# Patient Record
Sex: Female | Born: 1955
Health system: Southern US, Community
[De-identification: ages and names within clinical notes are randomized; demographics above are authoritative.]

## PROBLEM LIST (undated history)

## (undated) DIAGNOSIS — I10 Essential (primary) hypertension: Secondary | ICD-10-CM

## (undated) DIAGNOSIS — E1165 Type 2 diabetes mellitus with hyperglycemia: Principal | ICD-10-CM

## (undated) DIAGNOSIS — E119 Type 2 diabetes mellitus without complications: Secondary | ICD-10-CM

## (undated) DIAGNOSIS — E079 Disorder of thyroid, unspecified: Secondary | ICD-10-CM

## (undated) HISTORY — PX: THYROID SURGERY: SHX805

## (undated) HISTORY — DX: Type 2 diabetes mellitus with hyperglycemia: E11.65

---

## 1997-07-15 ENCOUNTER — Other Ambulatory Visit: Admission: RE | Admit: 1997-07-15 | Discharge: 1997-07-15 | Payer: Self-pay | Admitting: General Surgery

## 2002-11-14 ENCOUNTER — Encounter: Payer: Self-pay | Admitting: Endocrinology

## 2002-11-14 ENCOUNTER — Ambulatory Visit (HOSPITAL_COMMUNITY): Admission: RE | Admit: 2002-11-14 | Discharge: 2002-11-14 | Payer: Self-pay | Admitting: Endocrinology

## 2003-01-17 ENCOUNTER — Ambulatory Visit (HOSPITAL_COMMUNITY): Admission: RE | Admit: 2003-01-17 | Discharge: 2003-01-17 | Payer: Self-pay | Admitting: Family Medicine

## 2003-01-17 ENCOUNTER — Encounter: Payer: Self-pay | Admitting: Family Medicine

## 2003-01-29 ENCOUNTER — Ambulatory Visit (HOSPITAL_COMMUNITY): Admission: RE | Admit: 2003-01-29 | Discharge: 2003-01-29 | Payer: Self-pay | Admitting: Family Medicine

## 2003-01-29 ENCOUNTER — Encounter (INDEPENDENT_AMBULATORY_CARE_PROVIDER_SITE_OTHER): Payer: Self-pay | Admitting: Specialist

## 2003-01-29 ENCOUNTER — Encounter: Payer: Self-pay | Admitting: Family Medicine

## 2003-11-14 ENCOUNTER — Encounter: Admission: RE | Admit: 2003-11-14 | Discharge: 2003-11-14 | Payer: Self-pay | Admitting: Endocrinology

## 2003-12-17 ENCOUNTER — Ambulatory Visit (HOSPITAL_COMMUNITY): Admission: RE | Admit: 2003-12-17 | Discharge: 2003-12-17 | Payer: Self-pay | Admitting: Endocrinology

## 2004-07-16 ENCOUNTER — Other Ambulatory Visit: Admission: RE | Admit: 2004-07-16 | Discharge: 2004-07-16 | Payer: Self-pay | Admitting: *Deleted

## 2004-12-09 ENCOUNTER — Ambulatory Visit (HOSPITAL_COMMUNITY): Admission: RE | Admit: 2004-12-09 | Discharge: 2004-12-09 | Payer: Self-pay | Admitting: Endocrinology

## 2005-04-19 ENCOUNTER — Encounter: Admission: RE | Admit: 2005-04-19 | Discharge: 2005-04-19 | Payer: Self-pay | Admitting: Otolaryngology

## 2006-12-07 ENCOUNTER — Encounter: Admission: RE | Admit: 2006-12-07 | Discharge: 2006-12-07 | Payer: Self-pay | Admitting: Endocrinology

## 2006-12-29 ENCOUNTER — Encounter: Admission: RE | Admit: 2006-12-29 | Discharge: 2006-12-29 | Payer: Self-pay | Admitting: Endocrinology

## 2006-12-29 ENCOUNTER — Other Ambulatory Visit: Admission: RE | Admit: 2006-12-29 | Discharge: 2006-12-29 | Payer: Self-pay | Admitting: Interventional Radiology

## 2006-12-29 ENCOUNTER — Encounter (INDEPENDENT_AMBULATORY_CARE_PROVIDER_SITE_OTHER): Payer: Self-pay | Admitting: Interventional Radiology

## 2007-05-22 ENCOUNTER — Encounter: Admission: RE | Admit: 2007-05-22 | Discharge: 2007-05-22 | Payer: Self-pay | Admitting: Endocrinology

## 2007-12-21 ENCOUNTER — Other Ambulatory Visit: Admission: RE | Admit: 2007-12-21 | Discharge: 2007-12-21 | Payer: Self-pay | Admitting: Family Medicine

## 2008-02-26 ENCOUNTER — Ambulatory Visit (HOSPITAL_COMMUNITY): Admission: RE | Admit: 2008-02-26 | Discharge: 2008-02-27 | Payer: Self-pay | Admitting: General Surgery

## 2008-02-26 ENCOUNTER — Encounter (HOSPITAL_BASED_OUTPATIENT_CLINIC_OR_DEPARTMENT_OTHER): Payer: Self-pay | Admitting: General Surgery

## 2008-04-07 IMAGING — US US BIOPSY
1 series · 5 of 5 positions shown · non-contrast
Comparison: none

CLINICAL DATA: Palpable left thyroid mass with history of cyst on the side with previous benign biopsy performed in 5556.  Patient with symptoms of pressure in her throat.  Request has been made for fine needle aspiration of this nodule.
 ULTRASOUND-GUIDED FINE NEEDLE ASPIRATION, LEFT LOBE OF THYROID:

[Series 1: us biopsy · 5 acquisitions, 5 frames shown]
[im 1/5]
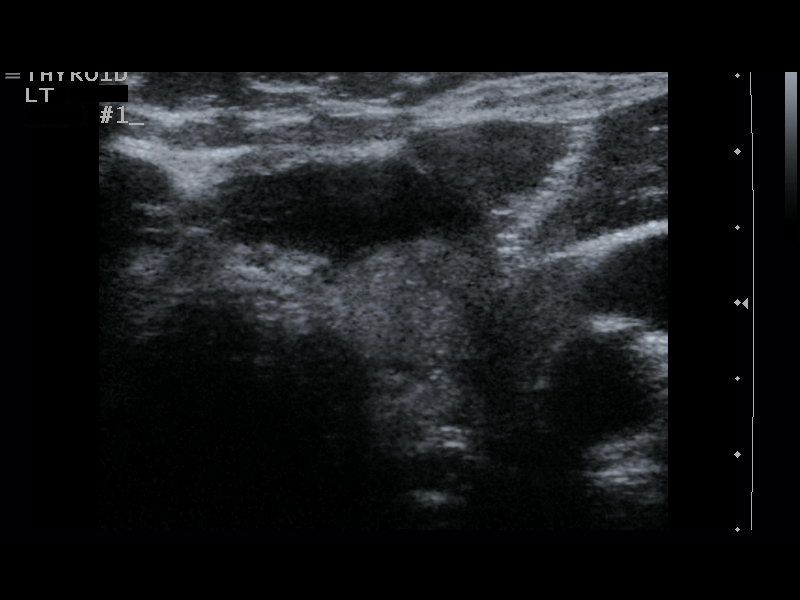
[im 2/5]
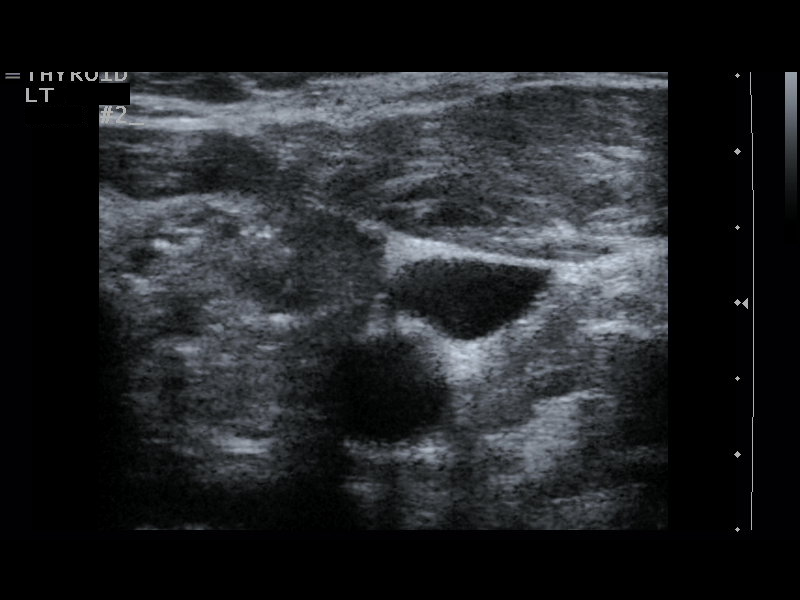
[im 3/5]
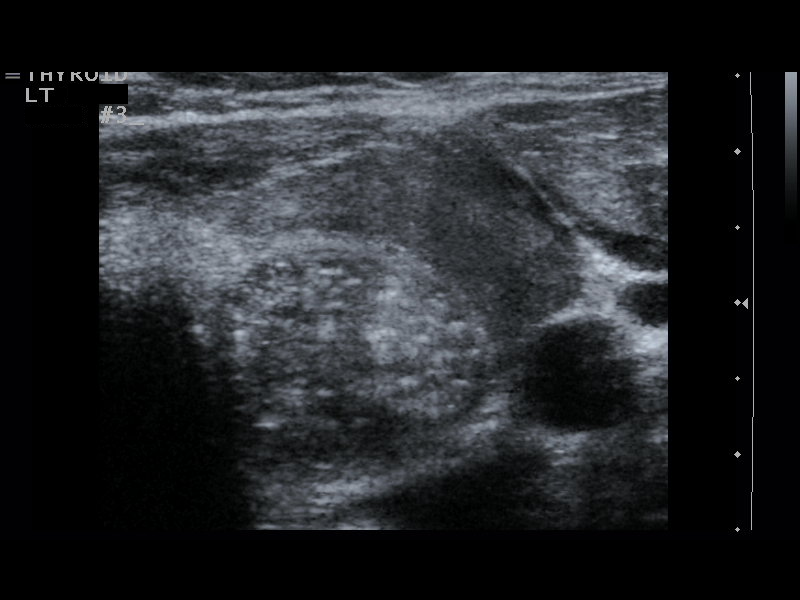
[im 4/5]
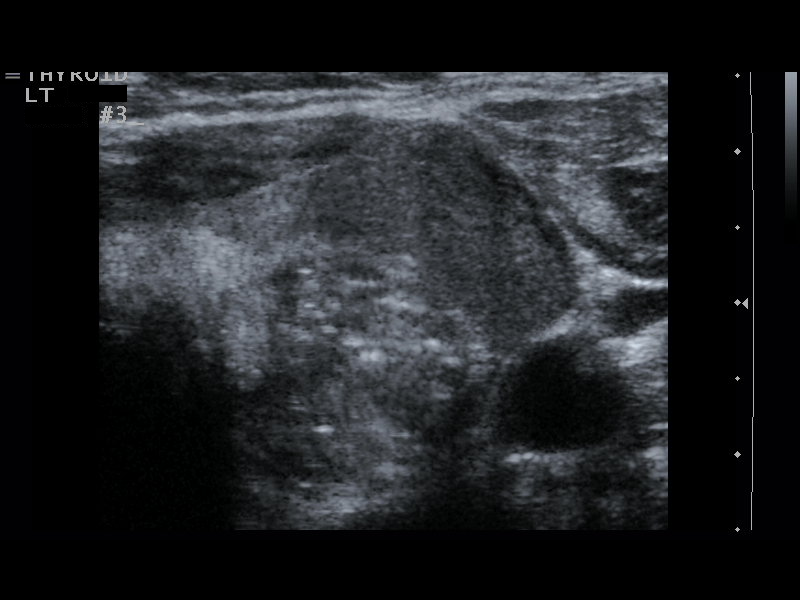
[im 5/5]
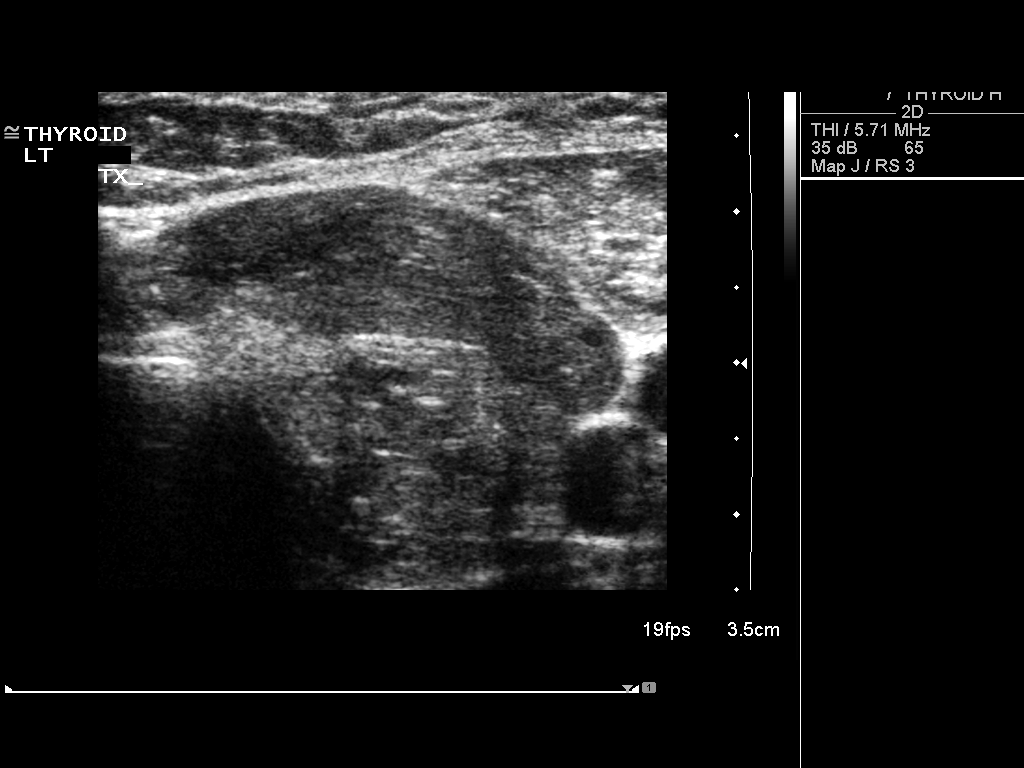

[5 of 5 positions shown; findings below may reference images not displayed]

FINDINGS: The above procedure was thoroughly discussed with the patient and written informed consent was obtained. 
 Ultrasound was then performed to localize and mark an adequate site for the biopsy.  The patient was then prepped and draped in a normal sterile fashion.  1% lidocaine was used for local anesthesia.  Using direct ultrasound guidance, three passes were made using a 25 gauge hypodermic needle into the dominant calcified nodule located within the left lobe of the thyroid.  Ultrasound confirmed placement of the needle on all three occasions.  The specimens were given to Pathology for further analysis.  There was a small amount of bleeding noted around the nodule post procedure.  The patient denied dysphagia or dyspnea other than her normal baseline.  The patient was advised to continue the ice pack, to refrain from aspirin products, and to call us if she experiences any difficulty swallowing or breathing.
IMPRESSION: Successful ultrasound-guided fine needle aspiration, left lobe of the thyroid.  Final pathology pending.

## 2009-03-22 ENCOUNTER — Emergency Department (HOSPITAL_COMMUNITY): Admission: EM | Admit: 2009-03-22 | Discharge: 2009-03-22 | Payer: Self-pay | Admitting: Family Medicine

## 2009-06-11 ENCOUNTER — Encounter: Admission: RE | Admit: 2009-06-11 | Discharge: 2009-08-20 | Payer: Self-pay | Admitting: Family Medicine

## 2010-05-14 ENCOUNTER — Other Ambulatory Visit: Payer: Self-pay | Admitting: Family Medicine

## 2010-05-15 ENCOUNTER — Ambulatory Visit
Admission: RE | Admit: 2010-05-15 | Discharge: 2010-05-15 | Disposition: A | Discharge status: Home / Self Care | Payer: Medicaid Other | Source: Ambulatory Visit | Attending: Family Medicine | Admitting: Family Medicine

## 2010-05-18 ENCOUNTER — Other Ambulatory Visit (HOSPITAL_COMMUNITY): Payer: Self-pay | Admitting: Family Medicine

## 2010-05-18 DIAGNOSIS — R109 Unspecified abdominal pain: Secondary | ICD-10-CM

## 2010-06-01 ENCOUNTER — Ambulatory Visit (HOSPITAL_COMMUNITY)
Admission: RE | Admit: 2010-06-01 | Discharge: 2010-06-01 | Disposition: A | Discharge status: Home / Self Care | Payer: Medicaid Other | Source: Ambulatory Visit | Attending: Family Medicine | Admitting: Family Medicine

## 2010-06-01 DIAGNOSIS — R948 Abnormal results of function studies of other organs and systems: Secondary | ICD-10-CM | POA: Insufficient documentation

## 2010-06-01 DIAGNOSIS — R109 Unspecified abdominal pain: Secondary | ICD-10-CM | POA: Insufficient documentation

## 2010-06-01 MED ORDER — TECHNETIUM TC 99M MEBROFENIN IV KIT
5.0000 | PACK | Freq: Once | INTRAVENOUS | Status: AC | PRN
  Administered 2010-06-01: 5 via INTRAVENOUS

## 2010-08-25 NOTE — Op Note (Signed)
NAME:  Katie Cook, Katie Cook NO.:  [id number]   MEDICAL RECORD NO.:  [id number]          PATIENT TYPE:  AMB   LOCATION:  DAY                          FACILITY:  [facility]   PHYSICIAN:  Leonie Man, M.D.   DATE OF BIRTH:  January 01, 1956   DATE OF PROCEDURE:  02/26/2008  DATE OF DISCHARGE:                               OPERATIVE REPORT   PREOPERATIVE DIAGNOSIS:  Multinodular goiter with compression symptoms.   POSTOPERATIVE DIAGNOSIS:  Multinodular goiter with compression symptoms.   PROCEDURE:  Total thyroidectomy.   SURGEON:  Leonie Man, M.D.   ASSISTANT:  Dr. Consuello Bossier.   ANESTHESIA:  General.   INDICATIONS:  The patient is a 55 year old female presenting with severe  compression symptoms of choking and difficulty breathing from time to  time when she lays flat.  She was scanned and noted to have a  multinodular goiter primarily enlarged on the left side with a large 2  cm nodule within the gland.  Biopsies of both thyroid nodule on the  right and left showed follicular lesions without any atypia consistent  with nonneoplastic goiter.  The patient comes to the operating room now  after risks and potential benefits of surgery been fully discussed, all  questions answered, consent obtained.  The patient is fully aware of the  possibility of recurrent laryngeal nerve injury and a permanent  hypoparathyroidism.   PROCEDURE:  The patient positioned supinely.  Following the induction of  satisfactory general endotracheal anesthesia, the head and neck  hyperextended and the neck is prepped and draped to be included in a  sterile operative field.  Preoperative precautions carried out by  identifying the patient as Katie Cook, procedure to be done as  total thyroidectomy.  Antibiotics given, Cipro 400 mg IV.  No equipment  or airway problems noted prior to beginning of surgery.  The patient did  have her blood sugar checked preoperatively which was noted  to be 178.  She got 4 units of Humulin.   A transverse collar incision is made approximately two fingerbreadths  above the sternal notch, deepened through skin and subcutaneous tissue  and extending from the anterior sternocleidomastoid border on the left  to the right.  Superior myocutaneous flap was raised up to the thyroid  cartilage and inferior myocutaneous flap carried down to the sternal  notch.  The strap muscles are opened in the midline and the dissection  carried over first to the left side exposing the entire left thyroid  lobe, dissection carried superior to the superior pole where the  superior pole vessels were isolated, doubly clipped and transected with  a Harmonic scalpel.  A positive identification of the superior and  inferior parathyroid glands were carried out.  The recurrent laryngeal  nerve was also seen entering the course of dissection.  This was  protected.  Thyroid gland was then dissected free from the surrounding  soft tissue carrying the dissection down along the posterior portion of  the thyroid lobe.  The inferior thyroid vessels were taken between clips  and transected with a Harmonic scalpel.  Dissection was carried down  across the trachea taking  thus a small pyramidal lobe.  The left neck  was then checked for hemostasis.  This was noted to be good.  A sponge  was left in the left neck while attention was turned to the right side  of the neck.  Similarly the midline strap muscles were raised off of the  thyroid gland carrying the dissection laterally and then superiorly up  to the upper pole vessels.  The upper pole vessels were identified,  mobilized, doubly clipped and transected with a harmonic scalpel.  The  upper pole parathyroid gland on the right side was also identified and  protected throughout the course of dissection.  The recurrent laryngeal  nerve and inferior parathyroid glands were also identified and these  were protected throughout  the course of dissection.  Dissection was  carried from superiorly inferiorly with the inferior pole vessels taken  between clips and secured with the Harmonic scalpel.  The entire thyroid  was then dissected free from the trachea.  The left upper pole was  marked with a suture for later identification.  All areas of dissection  within the neck were checked for hemostasis.  Additional bleeding points  treated with electrocautery.  Sponge and instrument counts doubly  verified.  Surgicel pads were placed within the neck on both sides for  additional hemostasis.  The midline strap muscles were closed with  interrupted 3-0 Vicryl sutures.  The platysma muscle closed with  interrupted 3-0 Vicryl sutures.  The skin closed with running 5-0  Monocryl suture reinforced with Steri-Strips and a sterile dressing  applied.  Anesthetic reversed, the patient removed from the operating  room to the recovery room in stable condition.  She tolerated the  procedure well.      Leonie Man, M.D.  Electronically Signed     PB/MEDQ  D:  02/26/2008  T:  02/27/2008  Job:  [id number]   cc:   Dorisann Frames, M.D.  Fax: [phone]

## 2011-01-12 LAB — PROTIME-INR
INR: 1
Prothrombin Time: 12.9

## 2011-01-12 LAB — DIFFERENTIAL
Basophils Absolute: 0.1
Basophils Relative: 1
Eosinophils Absolute: 0.4
Eosinophils Relative: 4
Lymphocytes Relative: 24
Lymphs Abs: 2.4
Monocytes Absolute: 0.7
Monocytes Relative: 7
Neutro Abs: 6.5
Neutrophils Relative %: 65

## 2011-01-12 LAB — GLUCOSE, CAPILLARY
Glucose-Capillary: 151 — ABNORMAL HIGH
Glucose-Capillary: 171 — ABNORMAL HIGH
Glucose-Capillary: 178 — ABNORMAL HIGH
Glucose-Capillary: 187 — ABNORMAL HIGH
Glucose-Capillary: 215 — ABNORMAL HIGH
Glucose-Capillary: 216 — ABNORMAL HIGH
Glucose-Capillary: 233 — ABNORMAL HIGH
Glucose-Capillary: 88

## 2011-01-12 LAB — COMPREHENSIVE METABOLIC PANEL
ALT: 34
AST: 29
Albumin: 3.8
Alkaline Phosphatase: 95
BUN: 6
CO2: 28
Calcium: 9.8
Chloride: 102
Creatinine, Ser: 0.52
GFR calc non Af Amer: >60
Glucose, Bld: 217 — ABNORMAL HIGH
Potassium: 3.9
Sodium: 138
Total Bilirubin: 0.5
Total Protein: 7.4

## 2011-01-12 LAB — CBC
HCT: 37.8
Hemoglobin: 12.4
MCHC: 32.9
MCV: 77.1 — ABNORMAL LOW
Platelets: 283
RBC: 4.91
RDW: 16 — ABNORMAL HIGH
WBC: 10

## 2011-01-12 LAB — CALCIUM
Calcium: 8.6
Calcium: 8.6

## 2011-08-23 IMAGING — US US ABDOMEN COMPLETE
1 series · 14 of 25 positions shown · non-contrast
Comparison: None.

CLINICAL DATA: Abdominal pain, nausea and vomiting for 4 days,
diabetes, hypertension

COMPLETE ABDOMINAL ULTRASOUND

[Series 1: us abdomen complete · 0.35mm/px · 14 of 57 slices shown]
[im 1/57]
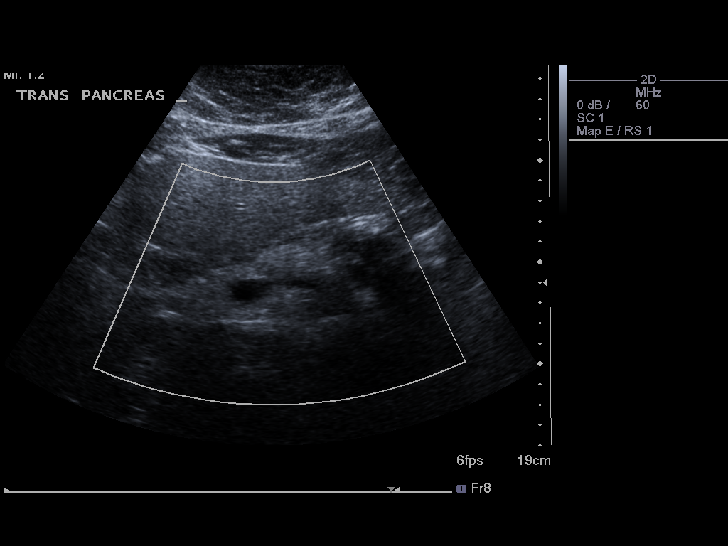
[im 5/57]
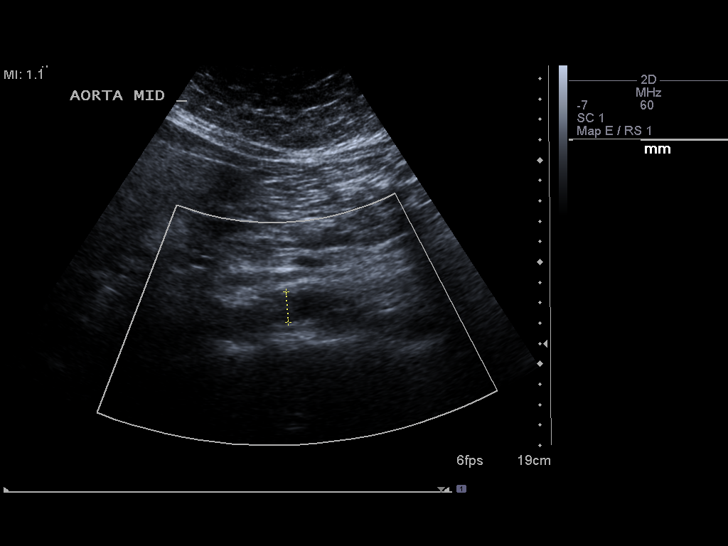
[im 10/57]
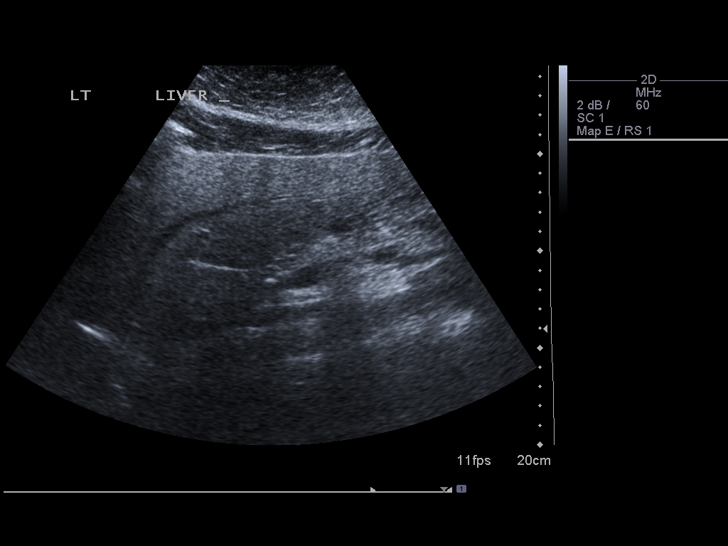
[im 15/57]
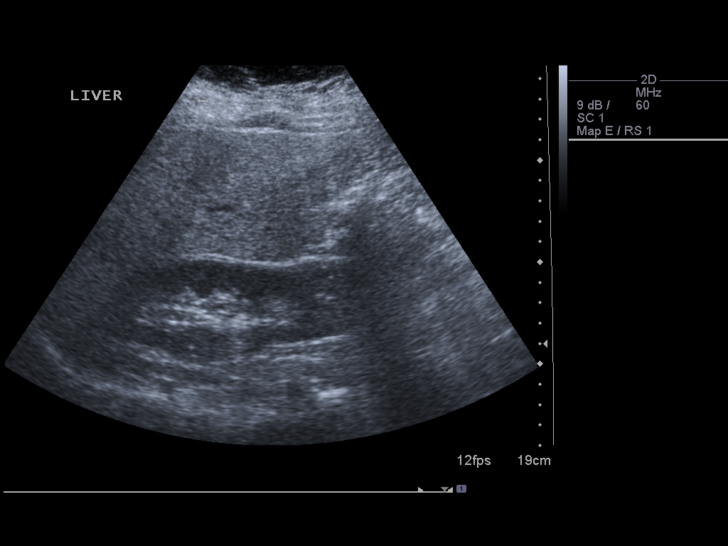
[im 19/57]
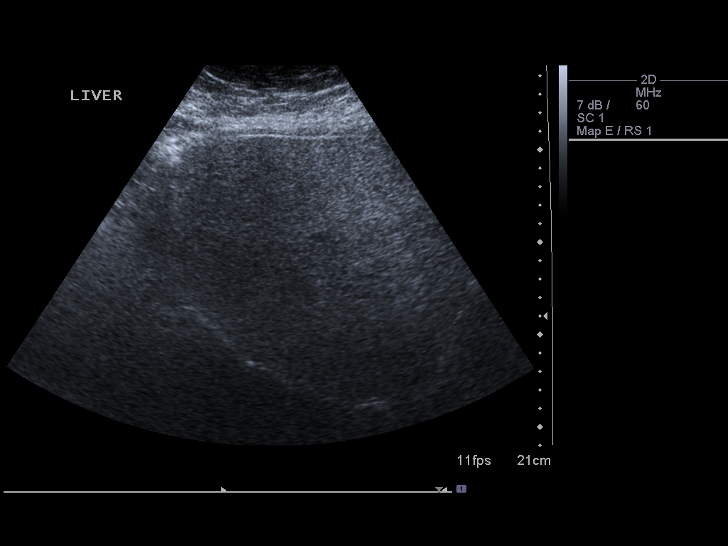
[im 22/57]
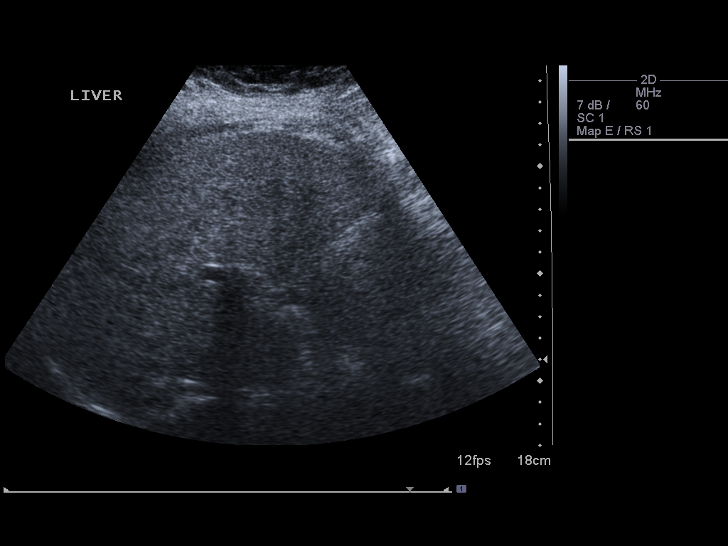
[im 26/57]
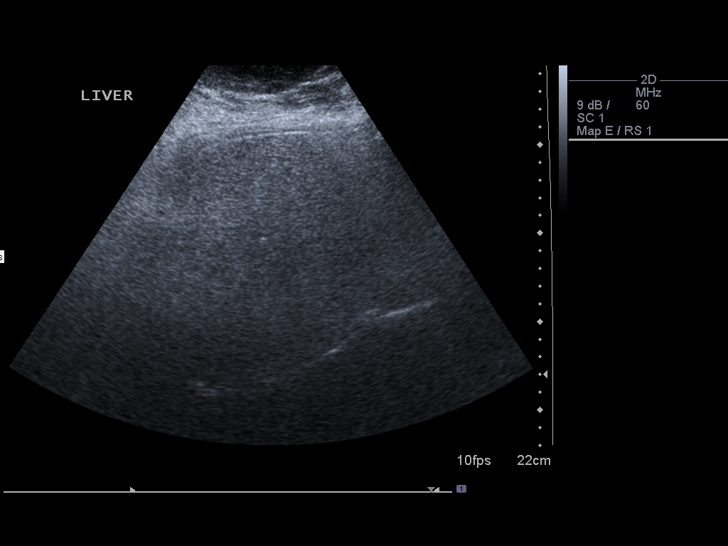
[im 31/57]
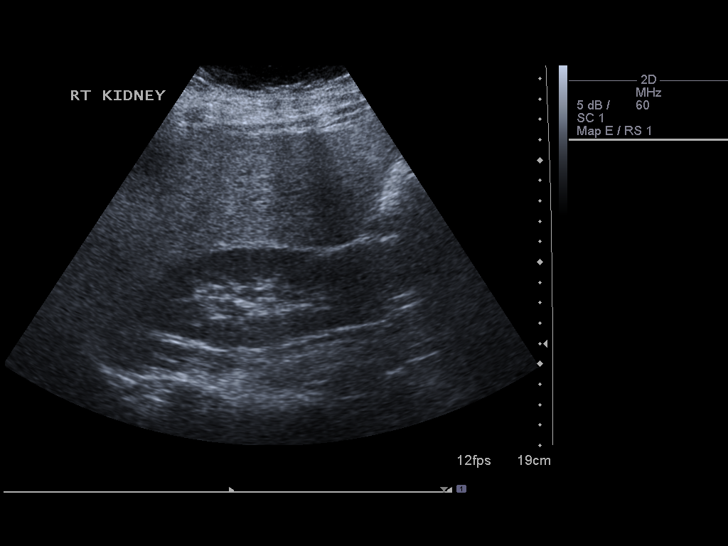
[im 36/57]
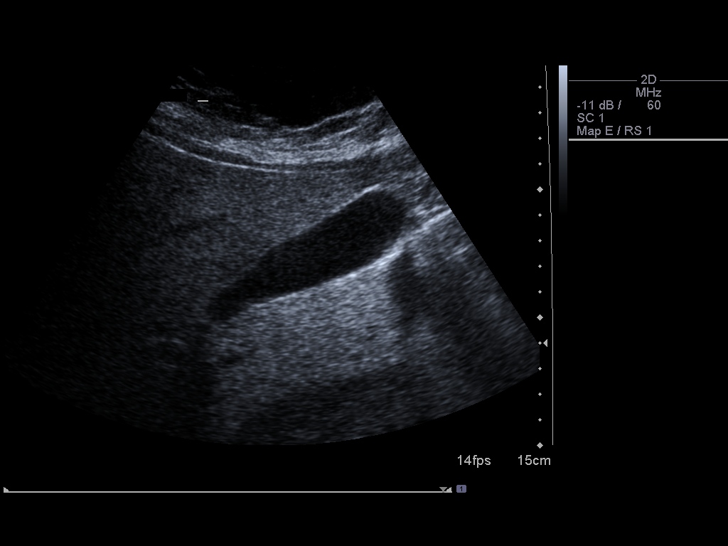
[im 38/57]
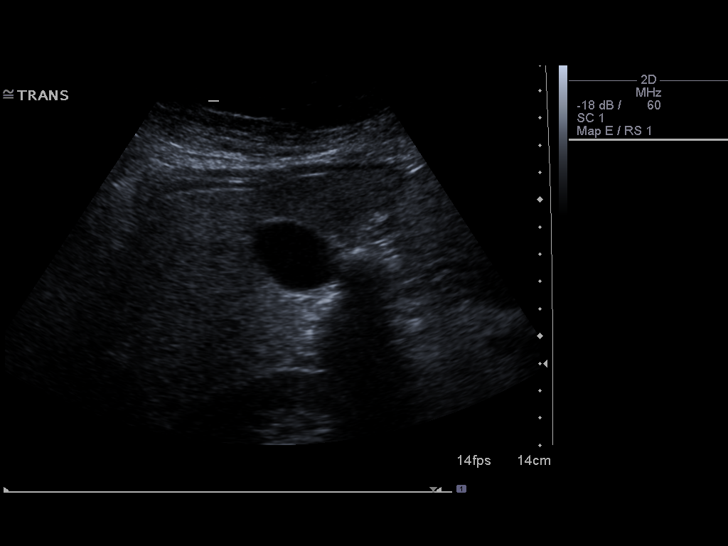
[im 43/57]
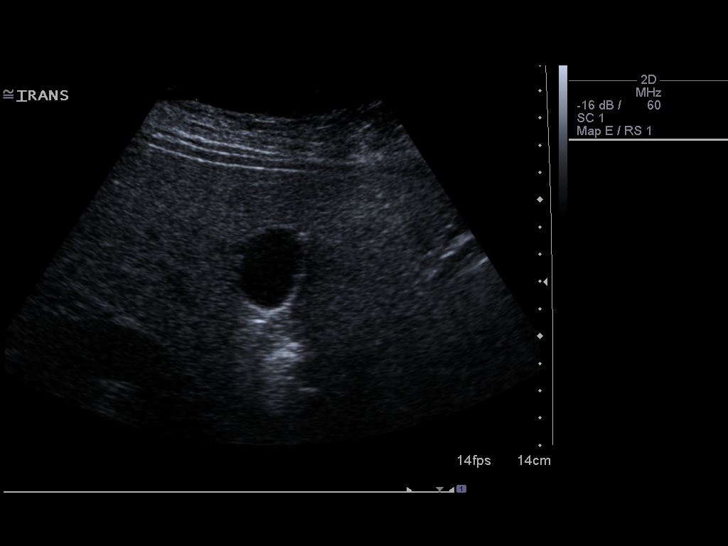
[im 47/57]
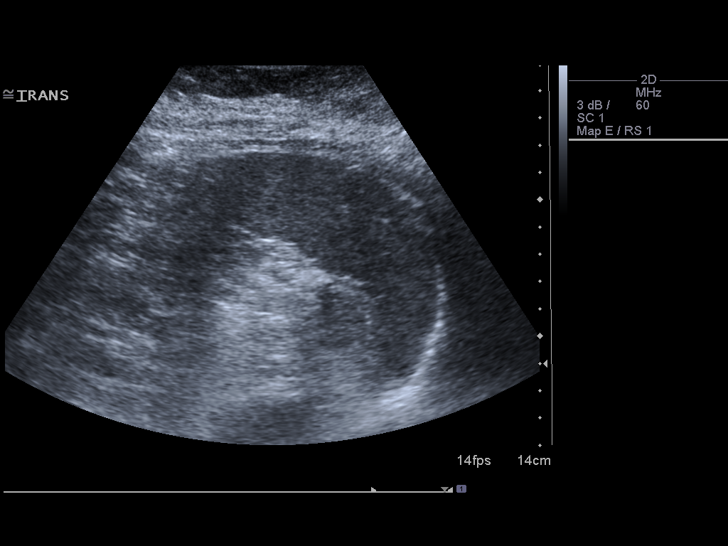
[im 52/57]
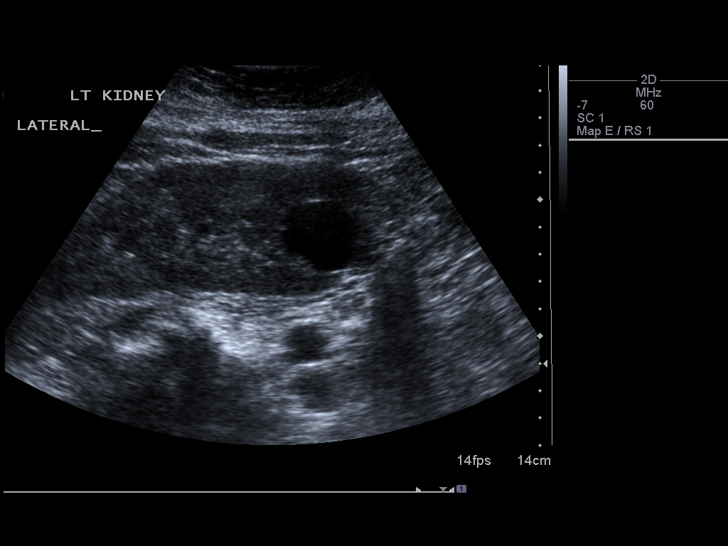
[im 57/57]
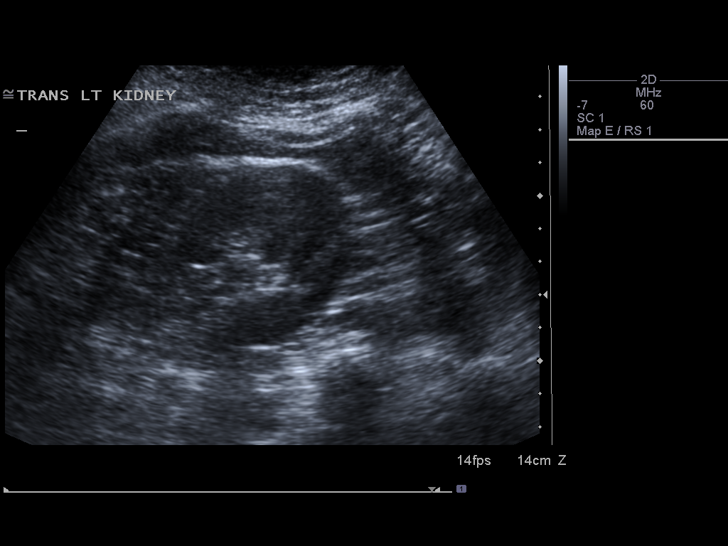

[14 of 25 positions shown; findings below may reference images not displayed]

FINDINGS: Gallbladder:  The gallbladder is visualized and no gallstones are
noted.  There is no pain over gallbladder with compression.

Common bile duct:  The common bile duct is normal measuring 4.5 mm
in diameter.

Liver:  The liver is inhomogeneous and echogenic consistent with
fatty infiltration.  No focal abnormality is seen.

IVC:  Appears normal.

Pancreas:  No focal abnormality seen.

Spleen:  The spleen is normal measuring 7.4 cm sagittally.

Right Kidney:  No hydronephrosis is noted.  The right kidney
measures 12.1 cm sagittally.

Left Kidney:  No hydronephrosis.  The left kidney measures 13.3 cm.
A cyst is present in the lower pole of 3.0 x 2.7 x 2.8 cm.

Abdominal aorta:  The abdominal aorta is normal in caliber although
the bifurcation is obscured by bowel gas.

This study is somewhat compromised by patient body habitus.
IMPRESSION: 1.  Fatty infiltration of the liver.  No ductal dilatation.
2.  No gallstones.
3.  The study is compromised by patient body habitus.

## 2012-02-01 ENCOUNTER — Ambulatory Visit: Payer: Medicaid Other | Attending: Obstetrics and Gynecology | Admitting: Physical Therapy

## 2012-02-01 DIAGNOSIS — IMO0001 Reserved for inherently not codable concepts without codable children: Secondary | ICD-10-CM | POA: Insufficient documentation

## 2012-02-01 DIAGNOSIS — M546 Pain in thoracic spine: Secondary | ICD-10-CM | POA: Insufficient documentation

## 2012-02-08 ENCOUNTER — Ambulatory Visit: Payer: Medicaid Other | Admitting: Physical Therapy

## 2012-02-15 ENCOUNTER — Encounter: Payer: Medicaid Other | Admitting: Physical Therapy

## 2012-02-22 ENCOUNTER — Ambulatory Visit: Payer: Medicaid Other | Attending: Obstetrics and Gynecology | Admitting: Physical Therapy

## 2012-02-22 DIAGNOSIS — M546 Pain in thoracic spine: Secondary | ICD-10-CM | POA: Insufficient documentation

## 2012-02-22 DIAGNOSIS — IMO0001 Reserved for inherently not codable concepts without codable children: Secondary | ICD-10-CM | POA: Insufficient documentation

## 2012-02-29 ENCOUNTER — Ambulatory Visit: Payer: Medicaid Other | Admitting: Physical Therapy

## 2012-06-13 ENCOUNTER — Encounter (HOSPITAL_COMMUNITY): Payer: Self-pay | Admitting: *Deleted

## 2012-06-13 ENCOUNTER — Emergency Department (HOSPITAL_COMMUNITY)
Admission: EM | Admit: 2012-06-13 | Discharge: 2012-06-13 | Disposition: A | Discharge status: Home / Self Care | Payer: Medicaid Other | Attending: Emergency Medicine | Admitting: Emergency Medicine

## 2012-06-13 DIAGNOSIS — Z79899 Other long term (current) drug therapy: Secondary | ICD-10-CM | POA: Insufficient documentation

## 2012-06-13 DIAGNOSIS — H43811 Vitreous degeneration, right eye: Secondary | ICD-10-CM

## 2012-06-13 DIAGNOSIS — E079 Disorder of thyroid, unspecified: Secondary | ICD-10-CM | POA: Insufficient documentation

## 2012-06-13 DIAGNOSIS — H43819 Vitreous degeneration, unspecified eye: Secondary | ICD-10-CM | POA: Insufficient documentation

## 2012-06-13 DIAGNOSIS — H43391 Other vitreous opacities, right eye: Secondary | ICD-10-CM

## 2012-06-13 DIAGNOSIS — Z794 Long term (current) use of insulin: Secondary | ICD-10-CM | POA: Insufficient documentation

## 2012-06-13 DIAGNOSIS — E119 Type 2 diabetes mellitus without complications: Secondary | ICD-10-CM | POA: Insufficient documentation

## 2012-06-13 DIAGNOSIS — I10 Essential (primary) hypertension: Secondary | ICD-10-CM | POA: Insufficient documentation

## 2012-06-13 DIAGNOSIS — K829 Disease of gallbladder, unspecified: Secondary | ICD-10-CM | POA: Insufficient documentation

## 2012-06-13 HISTORY — DX: Essential (primary) hypertension: I10

## 2012-06-13 HISTORY — DX: Type 2 diabetes mellitus without complications: E11.9

## 2012-06-13 HISTORY — DX: Disorder of thyroid, unspecified: E07.9

## 2012-06-13 NOTE — ED Notes (Addendum)
C/o flashing lights, pinpoints to R eye, intermitant, denies pain, OU appear unremarkable & equal, PERRL, 4mm brisk, notices mostly in periphery.

## 2012-06-13 NOTE — ED Notes (Signed)
Visual acquity both eyes 20/30 rt 20/30 lt 20/30

## 2012-06-13 NOTE — ED Provider Notes (Signed)
History     CSN: [id number]  Arrival date & time 06/13/12  0046   None     Chief Complaint  Patient presents with  . Eye Problem    (Consider location/radiation/quality/duration/timing/severity/associated sxs/prior treatment) HPI Salwa Bai is a 57 y.o. female presenting with flashes and floaters of the right eye. Patient has had a history of floaters in the right eye but they have increased today. She's had no change in visual acuity today, she does wear bifocals and sees optometrist Dr. Emily Filbert. On review of systems she's had intermittent headaches, she typically does get headaches and currently does not have a headache. These are intermittent, they typically go away with over-the-counter medications. Patient denies any jaw claudication, headaches are typically frontal.  No history of any recent illness, cough, sore throat, itchy watery eyes, chest pain, shortness of breath, abdominal pain - this evening. Patient occasionally has "gallbladder attacks" and she is spoken with the surgeon concerning this but does not want to have her gallbladder out now because she has bad experiences with anesthesia and analgesia.    Past Medical History  Diagnosis Date  . Hypertension   . Diabetes mellitus without complication   . Thyroid disease     Past Surgical History  Procedure Laterality Date  . Cesarean section    . Thyroid surgery      No family history on file.  History  Substance Use Topics  . Smoking status: Never Smoker   . Smokeless tobacco: Not on file  . Alcohol Use: No    OB History   Grav Para Term Preterm Abortions TAB SAB Ect Mult Living                  Review of Systems Undilated least 10pt or greater review of systems completed and are negative except where specified in the HPI.  Allergies  Ceclor and Sulfa antibiotics  Home Medications  No current outpatient prescriptions on file.  BP 202/97  Pulse 80  Temp(Src) 98.1 F (36.7 C) (Oral)  Resp 17   SpO2 98%  Physical Exam  Nursing notes reviewed.  Electronic medical record reviewed. VITAL SIGNS:   Filed Vitals:   06/13/12 0056 06/13/12 0156  BP: 202/97 149/78  Pulse: 80 68  Temp: 98.1 F (36.7 C)   TempSrc: Oral   Resp: 17 14  SpO2: 98% 98%   CONSTITUTIONAL: Awake, oriented, appears non-toxic HENT: Atraumatic, normocephalic, oral mucosa pink and moist, airway patent. Nares patent without drainage. External ears normal. EYES: Conjunctiva clear, EOMI, PERRLA 4 mm. Disc is crisp in the right eye undilated funduscopic exam. No visual field deficits. NECK: Trachea midline, non-tender, supple CARDIOVASCULAR: Normal heart rate, Normal rhythm, No murmurs, rubs, gallops PULMONARY/CHEST: Clear to auscultation, no rhonchi, wheezes, or rales. Symmetrical breath sounds. Non-tender. ABDOMINAL: Non-distended, obese, soft, non-tender - no rebound or guarding.  BS normal. NEUROLOGIC: Non-focal, moving all four extremities, no gross sensory or motor deficits. EXTREMITIES: No clubbing, cyanosis, or edema SKIN: Warm, Dry, No erythema, No rash  ED Course  Procedures (including critical care time) Bedside ultrasound performed of patient's eye looking for retinal detachment. Scattered hyper echoic signals were seen throughout the vitreous likely representative of floaters, no retinal detachment detected on ultrasound when viewed in orthogonal planes. Optic nerve did not appear enlarged.   Labs Reviewed - No data to display No results found.   1. Vitreous detachment, right   2. Flashers or floaters of right eye  MDM  Danaisha Celli is a 57 y.o. female  Visit history of floaters presents amount of floaters as well as flashers in the right eye tonight. Patient has no loss of visual acuity, her history is not consistent with retinal detachment.  Patient has no pain in the eye either, I do not suspect glaucoma, infection or foreign body. History is not consistent with temporal arteritis.  Undilated funduscopic exam is unremarkable. Ultrasound of the patient's retina with a linear probe is consistent with some floaters, I do not see any retinal detachment. Patient sees optometrist to Dr. Emily Filbert - she can call the office tomorrow followup in 2-3 days. Have explained the diagnosis at length, patient stands to return to the emergency department should she have any decreased vision, curtain coming over her vision or any other signs or symptoms consistent with retinal detachment or should she start having any pain or other concerning symptoms. Patient be discharged home stable and in good condition.        Jones Skene, MD 06/13/12 4782

## 2013-02-22 ENCOUNTER — Other Ambulatory Visit (HOSPITAL_COMMUNITY)
Admission: RE | Admit: 2013-02-22 | Discharge: 2013-02-22 | Disposition: A | Discharge status: Home / Self Care | Payer: Medicaid Other | Source: Ambulatory Visit | Attending: Family Medicine | Admitting: Family Medicine

## 2013-02-22 ENCOUNTER — Other Ambulatory Visit: Payer: Self-pay | Admitting: Family Medicine

## 2013-02-22 DIAGNOSIS — Z01419 Encounter for gynecological examination (general) (routine) without abnormal findings: Secondary | ICD-10-CM | POA: Insufficient documentation

## 2013-07-13 ENCOUNTER — Ambulatory Visit: Payer: Self-pay | Admitting: Internal Medicine

## 2013-07-16 ENCOUNTER — Ambulatory Visit: Payer: Self-pay | Admitting: Internal Medicine

## 2013-07-17 ENCOUNTER — Ambulatory Visit: Payer: Self-pay | Admitting: Internal Medicine

## 2013-07-26 ENCOUNTER — Encounter: Payer: Self-pay | Admitting: Internal Medicine

## 2013-07-26 ENCOUNTER — Ambulatory Visit (INDEPENDENT_AMBULATORY_CARE_PROVIDER_SITE_OTHER): Payer: 59 | Admitting: Internal Medicine

## 2013-07-26 ENCOUNTER — Other Ambulatory Visit: Payer: Self-pay | Admitting: *Deleted

## 2013-07-26 VITALS — BP 122/64 | HR 76 | Temp 98.2°F | Resp 12 | Ht 61.0 in | Wt 230.0 lb

## 2013-07-26 DIAGNOSIS — E890 Postprocedural hypothyroidism: Secondary | ICD-10-CM | POA: Insufficient documentation

## 2013-07-26 DIAGNOSIS — E1165 Type 2 diabetes mellitus with hyperglycemia: Secondary | ICD-10-CM | POA: Insufficient documentation

## 2013-07-26 DIAGNOSIS — IMO0001 Reserved for inherently not codable concepts without codable children: Secondary | ICD-10-CM

## 2013-07-26 DIAGNOSIS — E039 Hypothyroidism, unspecified: Secondary | ICD-10-CM

## 2013-07-26 DIAGNOSIS — Z794 Long term (current) use of insulin: Secondary | ICD-10-CM

## 2013-07-26 HISTORY — DX: Reserved for inherently not codable concepts without codable children: IMO0001

## 2013-07-26 LAB — HEMOGLOBIN A1C: Hgb A1c MFr Bld: 8.1 % — ABNORMAL HIGH (ref 4.6–6.5)

## 2013-07-26 LAB — T4, FREE: Free T4: 1.96 ng/dL — ABNORMAL HIGH (ref 0.60–1.60)

## 2013-07-26 LAB — TSH: TSH: 0.04 u[IU]/mL — ABNORMAL LOW (ref 0.35–5.50)

## 2013-07-26 MED ORDER — GLUCOSE BLOOD VI STRP: ORAL_STRIP | Status: DC

## 2013-07-26 MED ORDER — METFORMIN HCL ER 500 MG PO TB24: 1000.0000 mg | ORAL_TABLET | Freq: Two times a day (BID) | ORAL | Status: DC

## 2013-07-26 MED ORDER — LEVOTHYROXINE SODIUM 175 MCG PO TABS: 175.0000 ug | ORAL_TABLET | Freq: Every day | ORAL | Status: DC

## 2013-07-26 NOTE — Patient Instructions (Signed)
- Please decrease Lantus to 45 units qhs  - continue Novolog, but change the dose as follows:  20 units with a smaller meal  25 units with a regular meal  30 units with a large meal - Please start Metformin XR 500 mg with dinner x 4 days. If you tolerate this well, add another Metformin tablet (500 mg) with breakfast x 4 days. If you tolerate this well, add another metformin tablet with dinner (total 1000 mg) x 4 days. If you tolerate this well, add another metformin tablets with breakfast (total 1000 mg). Continue with 1000 mg of metformin twice a day with breakfast and dinner.  Please return in 1 month with your sugar log.   PATIENT INSTRUCTIONS FOR TYPE 2 DIABETES:  **Please join MyChart!** - see attached instructions about how to join   DIET AND EXERCISE Diet and exercise is an important part of diabetic treatment.  We recommended aerobic exercise in the form of brisk walking (working between 40-60% of maximal aerobic capacity, similar to brisk walking) for 150 minutes per week (such as 30 minutes five days per week) along with 3 times per week performing 'resistance' training (using various gauge rubber tubes with handles) 5-10 exercises involving the major muscle groups (upper body, lower body and core) performing 10-15 repetitions (or near fatigue) each exercise. Start at half the above goal but build slowly to reach the above goals. If limited by weight, joint pain, or disability, we recommend daily walking in a swimming pool with water up to waist to reduce pressure from joints while allow for adequate exercise.    BLOOD GLUCOSES Monitoring your blood glucoses is important for continued management of your diabetes. Please check your blood glucoses 2-4 times a day: fasting, before meals and at bedtime (you can rotate these measurements - e.g. one day check before the 3 meals, the next day check before 2 of the meals and before bedtime, etc.   HYPOGLYCEMIA (low blood sugar) Hypoglycemia  is usually a reaction to not eating, exercising, or taking too much insulin/ other diabetes drugs.  Symptoms include tremors, sweating, hunger, confusion, headache, etc. Treat IMMEDIATELY with 15 grams of Carbs:   4 glucose tablets    cup regular juice/soda   2 tablespoons raisins   4 teaspoons sugar   1 tablespoon honey Recheck blood glucose in 15 mins and repeat above if still symptomatic/blood glucose <100. Please contact our office at [phone] if you have questions about how to next handle your insulin.  RECOMMENDATIONS TO REDUCE YOUR RISK OF DIABETIC COMPLICATIONS: * Take your prescribed MEDICATION(S). * Follow a DIABETIC diet: Complex carbs, fiber rich foods, heart healthy fish twice weekly, (monounsaturated and polyunsaturated) fats * AVOID saturated/trans fats, high fat foods, >2,300 mg salt per day. * EXERCISE at least 5 times a week for 30 minutes or preferably daily.  * DO NOT SMOKE OR DRINK more than 1 drink a day. * Check your FEET every day. Do not wear tightfitting shoes. Contact us if you develop an ulcer * See your EYE doctor once a year or more if needed * Get a FLU shot once a year * Get a PNEUMONIA vaccine once before and once after age 58 years  GOALS:  * Your Hemoglobin A1c of <7%  * fasting sugars need to be <130 * after meals sugars need to be <180 (2h after you start eating) * Your Systolic BP should be 140 or lower  * Your Diastolic BP should be 80 or  lower  * Your HDL (Good Cholesterol) should be 40 or higher  * Your LDL (Bad Cholesterol) should be 100 or lower  * Your Triglycerides should be 150 or lower  * Your Urine microalbumin (kidney function) should be <30 * Your Body Mass Index should be 25 or lower   We will be glad to help you achieve these goals. Our telephone number is: [phone].

## 2013-07-26 NOTE — Progress Notes (Signed)
Patient ID: Katie Cook, female   DOB: 1955/10/22, 58 y.o.   MRN: [id number]  HPI: Katie Cook is a 58 y.o.-year-old female, referred by her PCP, Dr. Tenny Craw, for management of DM2, insulin-dependent, uncontrolled, without complications and postsurgical hypothyroidism (after she had compression sx with MNG - noncancerous).   Hypothyroidism: - she had MNG >> developed compression sxs - thyroidectomy in 2011-2012 - hypothyroidism >> Levothyroxine, then Armour 6 x later: 180 mg 4/7 days and 120 in 3/7 days >> b/c insurance coverage >> switched to Levothyroxine 200 mcg daily. She feels more tired on it.  She takes the LT4: - in am - fasting - eats b'fast 20 min later: yoghurt - she takes it along with Hyzaar - she takes Omeprazole prn later in the day - takes MVI in pm or evening  Last TFTs (will obtain lab results from PCP): 04/2013: TSH 0.4 (at the [facility])  She feels more tired. Also, see HPI.  DM2: Patient has been diagnosed with GDM during pregnancy, then DM2 in 2005; she started insulin in 2008.  Last hemoglobin A1c was: ~8% 04/2013.  Pt is on a regimen of: - Lantus 55 units qhs - Novolog 25-30 units 2x a day. She stopped Victoza >> nausea, 3 years. She tried Actos. She tried Metformin >> nausea. She tried Prandin. She tried Januvia.  She describes a period in the past when she was dropping her sugars at nigt to 50s >> she relaxed her DM control afterwards >> sugars increased.  She lost 15 lbs in last year, now plateaued.  Pt checks her sugars 2x a day and they are: - am: 105-112 - 2h after b'fast: n/c - before lunch: 99-120 - 2h after lunch: n/c - before dinner: n/c - 2h after dinner: n/c - bedtime: 160-230 - nighttime: n/c Has lows. Lowest sugar was 80s; but 50s at night she has hypoglycemia awareness at 70.  Highest sugar was 200s.  Meter: AccuChek Aviva.  Pt's meals are: - Breakfast: yoghurt + fruit - Lunch: salad with [place] - Dinner: chicken,  broccoli, meat - Snacks: 2-3: fruit, popcorn  - no CKD, last BUN/creatinine:  Lab Results  Component Value Date   BUN 6 02/22/2008   CREATININE 0.52 02/22/2008  On Losartan. - Has HL. No lipid panel available. - last eye exam was in 01/2013. No DR.  - no numbness and tingling in her feet.  Pt has FH of DM in sister, grandparents.  ROS: Constitutional: + weight gain/loss, + fatigue, + hot flushes, + poor sleep, + nocturia Eyes: + blurry vision, no xerophthalmia ENT: + sore throat, no nodules palpated in throat, no dysphagia/odynophagia, no hoarseness, + tinnitus, + hypoacusis Cardiovascular: no CP/+ SOB/no palpitations/+ leg swelling Respiratory: no cough/+ SOB Gastrointestinal: no N/V/D/C, + heartburn Musculoskeletal: no muscle aches/+ joint aches Skin: no rashes, + hair loss Neurological: no tremors/numbness/tingling/dizziness, no HA Psychiatric: no depression/anxiety  Past Medical History  Diagnosis Date  . Hypertension   . Diabetes mellitus without complication   . Thyroid disease    Past Surgical History  Procedure Laterality Date  . Cesarean section    . Thyroid surgery     History   Social History  . Marital Status: Legally Separated    Spouse Name: N/A    Number of Children: 3   Occupational History  . 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 Smoking status: Never Smoker   . Smokeless tobacco: Not on file  .  Alcohol Use: No  . Drug Use: No   Current Outpatient Prescriptions on File Prior to Visit  Medication Sig Dispense Refill  . carvedilol (COREG) 12.5 MG tablet Take 12.5 mg by mouth daily.      . cetirizine (ZYRTEC) 10 MG tablet Take 10 mg by mouth daily.      . insulin aspart (NOVOLOG) 100 UNIT/ML injection Inject 25 Units into the skin 2 (two) times daily.      . insulin glargine (LANTUS) 100 UNIT/ML injection Inject 53 Units into the skin at bedtime.       Marland Kitchen. losartan-hydrochlorothiazide (HYZAAR) 100-25  MG per tablet Take 1 tablet by mouth daily.      . beclomethasone (QVAR) 40 MCG/ACT inhaler Inhale 2 puffs into the lungs 2 (two) times daily.      Marland Kitchen. omeprazole (PRILOSEC) 20 MG capsule Take 20 mg by mouth 2 (two) times daily.       No current facility-administered medications on file prior to visit.   Allergies  Allergen Reactions  . Ceclor [Cefaclor] Rash  . Sulfa Antibiotics Rash   Family History  Problem Relation Age of Onset  . Ovarian cancer Mother   . Cancer Mother   . Cancer Father     pancreatic   . Stroke Father   . Diabetes Sister   . Cancer Maternal Aunt     breast cancer   . Stroke Paternal Uncle   . Diabetes Maternal Grandmother   . Cancer Maternal Aunt     thyroid   . Hypertension Sister    PE: BP 122/64  Pulse 76  Temp(Src) 98.2 F (36.8 C) (Oral)  Resp 12  Ht 5\' 1"  (1.549 m)  Wt 230 lb (104.327 kg)  BMI 43.48 kg/m2  SpO2 96% Wt Readings from Last 3 Encounters:  07/26/13 230 lb (104.327 kg)   Constitutional: overweight, in NAD Eyes: PERRLA, EOMI, no exophthalmos ENT: moist mucous membranes, no thyromegaly, no cervical lymphadenopathy Cardiovascular: RRR, No MRG Respiratory: CTA B Gastrointestinal: abdomen soft, NT, ND, BS+ Musculoskeletal: no deformities, strength intact in all 4 Skin: moist, warm, no rashes Neurological: no tremor with outstretched hands, DTR normal in all 4  ASSESSMENT: 1. DM2, insulin-dependent, uncontrolled, without complications  2. Hypothyroidism - postsurgical - h/o benign MNG - thyroidectomy 2011  PLAN:  1. Patient with long-standing, recently more uncontrolled diabetes, on basal-bolus, which became insufficient - It is difficult to change her regimen as I do not have a sugar log, but for now I suggested to:  Patient Instructions  - Please decrease Lantus to 45 units qhs  - continue Novolog, but change the dose as follows:  20 units with a smaller meal  25 units with a regular meal  30 units with a large  meal - Please start Metformin XR 500 mg with dinner x 4 days. If you tolerate this well, add another Metformin tablet (500 mg) with breakfast x 4 days. If you tolerate this well, add another metformin tablet with dinner (total 1000 mg) x 4 days. If you tolerate this well, add another metformin tablets with breakfast (total 1000 mg). Continue with 1000 mg of metformin twice a day with breakfast and dinner.  Please return in 1 month with your sugar log.   - Strongly advised her to start checking sugars at different times of the day - check 3 times a day, rotating checks - given sugar log and advised how to fill it and to bring it at next appt  -  given foot care handout and explained the principles  - given instructions for hypoglycemia management "15-15 rule"  - advised for yearly eye exams - Return to clinic in 1 mo with sugar log   2. Hypothyroidism - will check TFTs today - can either continue Levothyroxine or switch back to Armour (but this is expensive for her) - advised pt to take Levothyroxine EVERY DAY, with water, >30 min before b'fast, separated by >4h from anti acid medication, calcium, iron, MVI.   Office Visit on 07/26/2013  Component Date Value Ref Range Status  . TSH 07/26/2013 0.04* 0.35 - 5.50 uIU/mL Final  . Free T4 07/26/2013 1.96* 0.60 - 1.60 ng/dL Final  . Hemoglobin [id number] 07/26/2013 8.1* 4.6 - 6.5 % Final   Glycemic Control Guidelines for People with Diabetes:Non Diabetic:  <6%Goal of Therapy: <7%Additional Action Suggested:  >8%    Pt taking too much LT4 >> will decrease dose to 175 mcg daily. Will call this in. Will recheck labs at next visit.

## 2013-07-27 ENCOUNTER — Encounter: Payer: Self-pay | Admitting: *Deleted

## 2013-08-20 ENCOUNTER — Ambulatory Visit: Payer: 59 | Admitting: Internal Medicine

## 2013-09-10 ENCOUNTER — Encounter: Payer: Self-pay | Admitting: Internal Medicine

## 2013-09-10 ENCOUNTER — Ambulatory Visit (INDEPENDENT_AMBULATORY_CARE_PROVIDER_SITE_OTHER): Payer: 59 | Admitting: Internal Medicine

## 2013-09-10 VITALS — BP 126/78 | HR 72 | Temp 98.0°F | Resp 12 | Wt 232.8 lb

## 2013-09-10 DIAGNOSIS — E1165 Type 2 diabetes mellitus with hyperglycemia: Principal | ICD-10-CM

## 2013-09-10 DIAGNOSIS — E039 Hypothyroidism, unspecified: Secondary | ICD-10-CM

## 2013-09-10 DIAGNOSIS — IMO0001 Reserved for inherently not codable concepts without codable children: Secondary | ICD-10-CM

## 2013-09-10 NOTE — Patient Instructions (Addendum)
-   Continue Lantus 50 units at bedtime - Continue NovoLog: 25 units with breakfast  35 units with dinner  Please return in 1.5 month with your sugar log.   Please stop at the lab.

## 2013-09-10 NOTE — Progress Notes (Signed)
Patient ID: Katie Cook, female   DOB: Aug 14, 1955, 58 y.o.   MRN: [id number]  HPI: Katie FlavorsMaria Cook is a 58 y.o.-year-old female, referred by her PCP, Dr. Tenny Crawoss, for management of DM2, insulin-dependent, uncontrolled, without complications and postsurgical hypothyroidism (after she had compression sx with MNG - noncancerous).   Hypothyroidism: - she had MNG >> developed compression sxs - thyroidectomy in 2011-2012 - hypothyroidism >> Levothyroxine, then Armour 6 x later: 180 mg 4/7 days and 120 in 3/7 days >> b/c insurance coverage >> switched to Levothyroxine 200 mcg daily. She feels more tired on it.  She takes the LT4: - in am - fasting - eats b'fast 20 min later: yoghurt - she takes it along with Hyzaar - she takes Omeprazole prn later in the day - takes MVI in pm or evening  Last TFTs (will obtain lab results from PCP): 04/2013: TSH 0.4 (at the [facility])  Last TSH was: Lab Results  Component Value Date   TSH 0.04* 07/26/2013   FREET4 1.96* 07/26/2013  We decreased the LT4 to 175 mcg daily.  DM2: Patient has been diagnosed with GDM during pregnancy, then DM2 in 2005; she started insulin in 2008.  Last hemoglobin A1c was: ~8% 04/2013.  Pt is on a regimen of: - Lantus 55 >> 45 >> 50 units qhs 20 units with a smaller meal 25 units with a regular meal  30 units with a large meal She stopped Victoza >> nausea, 3 years. She tried Actos. She tried Metformin >> nausea. She tried Prandin. She tried Januvia.  She lost 15 lbs in last year, now plateaued.  Pt checks her sugars 2x a day and they are: - am: 105-112 >> 90-128 (200) - 2h after b'fast: n/c >> 195, 284 - before lunch: 99-120 >> 120-152, 281   - 2h after lunch: n/c >> 92-156 - before dinner: n/c >> 90-150 (190) - 2h after dinner: n/c >> 197-281 - bedtime: 160-230 >> 160-180s (244) - nighttime: n/c >> 180, 194 Has lows. Lowest sugar was 90s; she has hypoglycemia awareness at 70.  Highest sugar was 284.  Meter:  AccuChek Aviva.  Pt's meals are: - Breakfast: yoghurt + fruit - Lunch: salad with [place] - Dinner: chicken, broccoli, meat - Snacks: 2-3: fruit, popcorn  - no CKD, last BUN/creatinine:  Lab Results  Component Value Date   BUN 6 02/22/2008   CREATININE 0.52 02/22/2008  On Losartan. - Has HL. No lipid panel available. - last eye exam was in 01/2013. No DR.  - no numbness and tingling in her feet.  Pt has FH of DM in sister, grandparents.  ROS: Constitutional: + weight gain/loss, + fatigue, + hot flushes, + poor sleep, + nocturia Eyes: + blurry vision, no xerophthalmia ENT: + sore throat, no nodules palpated in throat, no dysphagia/odynophagia, no hoarseness, + tinnitus, + hypoacusis Cardiovascular: no CP/+ SOB/no palpitations/+ leg swelling Respiratory: no cough/+ SOB Gastrointestinal: no N/V/D/C, + heartburn Musculoskeletal: no muscle aches/+ joint aches Skin: no rashes, + hair loss Neurological: no tremors/numbness/tingling/dizziness, no HA  PE: BP 126/78  Pulse 72  Temp(Src) 98 F (36.7 C) (Oral)  Resp 12  Wt 232 lb 12.8 oz (105.597 kg)  SpO2 97% Wt Readings from Last 3 Encounters:  09/10/13 232 lb 12.8 oz (105.597 kg)  07/26/13 230 lb (104.327 kg)   Constitutional: overweight, in NAD Eyes: PERRLA, EOMI, no exophthalmos ENT: moist mucous membranes, no thyromegaly, no cervical lymphadenopathy Cardiovascular: RRR, No MRG Respiratory: CTA B Gastrointestinal: abdomen soft, NT,  ND, BS+ Musculoskeletal: no deformities, strength intact in all 4 Skin: moist, warm, no rashes Neurological: no tremor with outstretched hands, DTR normal in all 4  ASSESSMENT: 1. DM2, insulin-dependent, uncontrolled, without complications  2. Hypothyroidism - postsurgical - h/o benign MNG - thyroidectomy 2011  PLAN:  1. Patient with long-standing, recently more uncontrolled diabetes, on basal-bolus, which became insufficient - It is difficult to change her regimen as I do not have a  sugar log, but for now I suggested to:  Patient Instructions  - Please decrease Lantus to 45 units qhs  - continue Novolog, but change the dose as follows:  20 units with a smaller meal  25 units with a regular meal  30 units with a large meal - Please start Metformin XR 500 mg with dinner x 4 days. If you tolerate this well, add another Metformin tablet (500 mg) with breakfast x 4 days. If you tolerate this well, add another metformin tablet with dinner (total 1000 mg) x 4 days. If you tolerate this well, add another metformin tablets with breakfast (total 1000 mg). Continue with 1000 mg of metformin twice a day with breakfast and dinner.  Please return in 1 month with your sugar log.   - Strongly advised her to start checking sugars at different times of the day - check 3 times a day, rotating checks - advised for yearly eye exams - Return to clinic in 1 mo with sugar log   2. Hypothyroidism - we decreased the Synthroid dose at last visit - to 175 mcg daily - will check TFTs today - can either continue Levothyroxine or switch back to Armour (but this is expensive for her) - advised pt to take Levothyroxine EVERY DAY, with water, >30 min before b'fast, separated by >4h from anti acid medication, calcium, iron, MVI.   Office Visit on 09/10/2013  Component Date Value Ref Range Status  . TSH 09/10/2013 0.05* 0.35 - 4.50 uIU/mL Final  . Free T4 09/10/2013 1.51  0.60 - 1.60 ng/dL Final   Pt still taking too much Levothyroxine >> will decrease dose to 150 mcg daily. Will call this in. Will recheck labs at next visit.

## 2013-09-11 ENCOUNTER — Other Ambulatory Visit: Payer: Self-pay | Admitting: Internal Medicine

## 2013-09-11 ENCOUNTER — Encounter: Payer: Self-pay | Admitting: Internal Medicine

## 2013-09-11 LAB — TSH: TSH: 0.05 u[IU]/mL — ABNORMAL LOW (ref 0.35–4.50)

## 2013-09-11 LAB — T4, FREE: Free T4: 1.51 ng/dL (ref 0.60–1.60)

## 2013-09-11 MED ORDER — LEVOTHYROXINE SODIUM 150 MCG PO TABS: 150.0000 ug | ORAL_TABLET | Freq: Every day | ORAL | Status: DC

## 2013-10-08 ENCOUNTER — Encounter: Payer: Self-pay | Admitting: Internal Medicine

## 2013-10-08 ENCOUNTER — Ambulatory Visit (INDEPENDENT_AMBULATORY_CARE_PROVIDER_SITE_OTHER): Payer: 59 | Admitting: Internal Medicine

## 2013-10-08 VITALS — BP 124/76 | HR 65 | Temp 98.5°F | Resp 12 | Wt 235.0 lb

## 2013-10-08 DIAGNOSIS — IMO0001 Reserved for inherently not codable concepts without codable children: Secondary | ICD-10-CM

## 2013-10-08 DIAGNOSIS — E039 Hypothyroidism, unspecified: Secondary | ICD-10-CM

## 2013-10-08 DIAGNOSIS — E1165 Type 2 diabetes mellitus with hyperglycemia: Principal | ICD-10-CM

## 2013-10-08 MED ORDER — INSULIN ASPART 100 UNIT/ML ~~LOC~~ SOLN: 20.0000 [IU] | Freq: Two times a day (BID) | SUBCUTANEOUS | Status: DC

## 2013-10-08 MED ORDER — INSULIN GLARGINE 100 UNIT/ML ~~LOC~~ SOLN: 45.0000 [IU] | Freq: Every day | SUBCUTANEOUS | Status: DC

## 2013-10-08 NOTE — Progress Notes (Signed)
Patient ID: Katie Cook, female   DOB: [birth year], 58 y.o.   MRN: [id number]  HPI: Katie Cook is a 58 y.o.-year-old female, referred by her PCP, Dr. Tenny Crawoss, for management of DM2, insulin-dependent, uncontrolled, without complications and postsurgical hypothyroidism (after she had compression sx with MNG - noncancerous).   Hypothyroidism: Reviewed hx: - she had MNG >> developed compression sxs - thyroidectomy in 2011-2012 - hypothyroidism >> Levothyroxine, then Armour 6 x later: 180 mg 4/7 days and 120 in 3/7 days >> b/c insurance coverage >> switched to Levothyroxine 200 mcg daily. She feels more tired on it.  She takes the LT4: - in am - fasting - eats b'fast 20 min later: yoghurt - she takes it along with Hyzaar - she takes Omeprazole prn later in the day - takes MVI in pm or evening  Last TSH was still suppressed: Lab Results  Component Value Date   TSH 0.05* 09/10/2013   TSH 0.04* 07/26/2013   FREET4 1.51 09/10/2013   FREET4 1.96* 07/26/2013  04/2013: TSH 0.4 (at the [facility])  At last visit, we decreased the LT4 to 150 mcg daily.  She feels tired and is gaining weight.  DM2: Patient has been diagnosed with GDM during pregnancy, then DM2 in 2005; she started insulin in 2008.  Last hemoglobin A1c was: ~8% 04/2013.  Pt is on a regimen of: - Lantus 55 >> 45 >> 50 units qhs 20 units with a smaller meal 25 units with a regular meal (usually with b'fast and lunch)  30 units with a large meal (usually with dinner) - Metformin XR 1000 mg at dinner - started 09/2013 She stopped Victoza >> nausea, 3 years. She tried Actos. She tried regular Metformin >> nausea. She tried Prandin. She tried Januvia.  She lost 15 lbs in last year, now plateaued.  Pt checks her sugars 2-3x a day and they are: - am: 105-112 >> 90-128 (200) >> 76-145 - 2h after b'fast: n/c >> 195, 284 >> 90, 192, 250 - before lunch: 99-120 >> 120-152, 281 >> 71-199 - 2h after lunch: n/c >> 92-156 >>  81-210 - before dinner: n/c >> 90-150 (190) >> 71-190 - 2h after dinner: n/c >> 197-281 >> 65-251 - bedtime: 160-230 >> 160-180s (244) >> 96-205 - nighttime: n/c >> 180, 194 >> 113, 295 Has lows. Lowest sugar was 90s; she has hypoglycemia awareness at 70.  Highest sugar was 250.  Meter: AccuChek Aviva.  Pt's meals are: - Breakfast: yoghurt + fruit - Lunch: salad with [place] - Dinner: chicken, broccoli, meat - Snacks: 2-3: fruit, popcorn  - no CKD, last BUN/creatinine:  Lab Results  Component Value Date   BUN 6 02/22/2008   CREATININE 0.52 02/22/2008  On Losartan. - Has HL. No lipid panel available. - last eye exam was in 01/2013. No DR.  - no numbness and tingling in her feet.  I reviewed pt's medications, allergies, PMH, social hx, family hx and no changes required, except as mentioned above.  ROS: Constitutional: + weight gain/loss, + fatigue, + hot flushes, + poor sleep, + nocturia Eyes: + blurry vision, no xerophthalmia ENT: + sore throat, no nodules palpated in throat, no dysphagia/odynophagia, no hoarseness, + tinnitus, + hypoacusis Cardiovascular: no CP/SOB/no palpitations/leg swelling Respiratory: no cough/SOB Gastrointestinal: + N/no V/D/C, + heartburn Musculoskeletal: no muscle aches/joint aches Skin: no rashes, + hair loss, + itching Neurological: no tremors/numbness/tingling/dizziness, + HA  PE: BP 124/76  Pulse 65  Temp(Src) 98.5 F (36.9 C) (Oral)  Resp 12  Wt 235 lb (106.595 kg)  SpO2 97% Wt Readings from Last 3 Encounters:  10/08/13 235 lb (106.595 kg)  09/10/13 232 lb 12.8 oz (105.597 kg)  07/26/13 230 lb (104.327 kg)   Constitutional: overweight, in NAD Eyes: PERRLA, EOMI, no exophthalmos ENT: moist mucous membranes, no thyromegaly, no cervical lymphadenopathy Cardiovascular: RRR, No MRG Respiratory: CTA B Gastrointestinal: abdomen soft, NT, ND, BS+ Musculoskeletal: no deformities, strength intact in all 4 Skin: moist, warm, no  rashes Neurological: no tremor with outstretched hands, DTR normal in all 4  ASSESSMENT: 1. DM2, insulin-dependent, uncontrolled, without complications  2. Hypothyroidism - postsurgical - h/o benign MNG - thyroidectomy 2011  PLAN:  1. Patient with long-standing, recently more uncontrolled diabetes, on basal-bolus, still with fluctuating sugars. We discussed to try to improve her meals. She has 2 small meals during the day and a large meal at dinner, which is a common but not good practice for achieving good DM control. I made specific suggestions about her meals. She is very frustrated that she gains weight despite trying to lose.    - I suggested to:  Patient Instructions  - Please decrease Lantus to 45 units qhs (she did not do this yet) - continue Novolog, but change the dose as follows:  20 units with a smaller meal  25 units with a regular meal  30 units with a large meal - Continue Metformin XR 1000 mg with dinner  Please return in 1.5 month with your sugar log.  - continue checking sugars at different times of the day - check 3 times a day, rotating checks - up to date with eye exams - Return to clinic in 1.5 mo with sugar log   2. Hypothyroidism - we decreased the Synthroid dose at last visit - to 150 mcg daily - she is unhappy that she is tired and gaining weight - we can either continue Levothyroxine or switch back to Armour (but this is expensive for her) - I again reviewed the pituitary-thyroid axis physiology with her and why we keep decreasing the doses of LT4 when TSH is low -explained the risk of a persistently low TSH: arrhythmia, increased coagulability, osteoporosis, etc. - will check TFTs today  - time spent with the patient: 40 min hour, of which >50% was spent in obtaining information about her symptoms, reviewing her previous labs, reviewing and discussing her CBG log, counseling her about diet, about her conditions (please see some of the the discussed topics  above), and developing a plan to address them. She had a number of questions which I addressed.  Office Visit on 10/08/2013  Component Date Value Ref Range Status  . TSH 10/08/2013 0.07* 0.35 - 4.50 uIU/mL Final  . Free T4 10/08/2013 1.38  0.60 - 1.60 ng/dL Final   We need to decrease the LT4 dose even further >> 125 mcg daily. Recheck TFTs at next visit.

## 2013-10-08 NOTE — Patient Instructions (Addendum)
-   Decrease Lantus 45 units at bedtime - Continue NovoLog: 20 units with a smaller meal  25 units with a regular meal (usually with b'fast and lunch)  30 units with a large meal (usually with dinner)  - Metformin XR 1000 mg at dinner  Please return in 1 month with your sugar log.   Please stop at the lab

## 2013-10-09 ENCOUNTER — Other Ambulatory Visit: Payer: Self-pay | Admitting: Internal Medicine

## 2013-10-09 LAB — T4, FREE: Free T4: 1.38 ng/dL (ref 0.60–1.60)

## 2013-10-09 LAB — TSH: TSH: 0.07 u[IU]/mL — ABNORMAL LOW (ref 0.35–4.50)

## 2013-10-09 MED ORDER — LEVOTHYROXINE SODIUM 125 MCG PO TABS
125.0000 ug | ORAL_TABLET | Freq: Every day | ORAL | Status: DC
Start: 2013-10-09 — End: 2013-10-09

## 2013-10-18 ENCOUNTER — Ambulatory Visit: Payer: 59 | Admitting: Internal Medicine

## 2013-10-19 ENCOUNTER — Ambulatory Visit: Payer: 59 | Admitting: Internal Medicine

## 2013-11-09 ENCOUNTER — Telehealth: Payer: Self-pay | Admitting: *Deleted

## 2013-11-09 ENCOUNTER — Ambulatory Visit (INDEPENDENT_AMBULATORY_CARE_PROVIDER_SITE_OTHER): Payer: 59 | Admitting: Internal Medicine

## 2013-11-09 ENCOUNTER — Encounter: Payer: Self-pay | Admitting: Internal Medicine

## 2013-11-09 VITALS — BP 140/92 | HR 65 | Temp 98.4°F | Resp 12 | Wt 236.0 lb

## 2013-11-09 DIAGNOSIS — IMO0001 Reserved for inherently not codable concepts without codable children: Secondary | ICD-10-CM

## 2013-11-09 DIAGNOSIS — E1165 Type 2 diabetes mellitus with hyperglycemia: Secondary | ICD-10-CM

## 2013-11-09 DIAGNOSIS — E039 Hypothyroidism, unspecified: Secondary | ICD-10-CM

## 2013-11-09 LAB — TSH: TSH: 0.21 u[IU]/mL — ABNORMAL LOW (ref 0.35–4.50)

## 2013-11-09 LAB — T4, FREE: Free T4: 1.24 ng/dL (ref 0.60–1.60)

## 2013-11-09 LAB — HEMOGLOBIN A1C: Hgb A1c MFr Bld: 7.9 % — ABNORMAL HIGH (ref 4.6–6.5)

## 2013-11-09 NOTE — Telephone Encounter (Signed)
Pt said she only has 3 tablets left of her levothyroxine. Pt asked if we could send in a new rx to her pharmacy, if it needs to change over the weekend? Advised the pt we would. Be advised.

## 2013-11-09 NOTE — Progress Notes (Signed)
Patient ID: Katie Cook, female   DOB: [birth year], 58 y.o.   MRN: [id number]  HPI: Katie FlavorsMaria Cook is a 58 y.o.-year-old female, returning for f/u for DM2, insulin-dependent, uncontrolled, without complications and postsurgical hypothyroidism (after she had compression sx with MNG - noncancerous).   She feels dizzy and lightheaded lately. She had to stop Metformin 3 days ago b/c dizziness and nausea.   Hypothyroidism: Reviewed hx: - she had MNG >> developed compression sxs - thyroidectomy in 2011-2012 - hypothyroidism >> Levothyroxine, then Armour 6 x later: 180 mg 4/7 days and 120 in 3/7 days >> b/c insurance coverage >> switched to Levothyroxine 200 mcg daily. She feels more tired on it.  She takes the LT4: - in am - fasting - eats b'fast 20 min later: yoghurt - she takes it along with Hyzaar - she takes Omeprazole prn later in the day - takes MVI in pm or evening  Last TSH was still suppressed: Lab Results  Component Value Date   TSH 0.07* 10/08/2013   TSH 0.05* 09/10/2013   TSH 0.04* 07/26/2013   FREET4 1.38 10/08/2013   FREET4 1.51 09/10/2013   FREET4 1.96* 07/26/2013  04/2013: TSH 0.4 (at the [facility])  We decreased the LT4 to 150 >> 125 mcg daily.  She feels tired and is gaining weight.  DM2: Patient has been diagnosed with GDM during pregnancy, then DM2 in 2005; she started insulin in 2008.   Last hemoglobin A1c was:  Lab Results  Component Value Date   HGBA1C 8.1* 07/26/2013  ~8% 04/2013.  Pt is on a regimen of: - Lantus 55 >> 45 >> 50 >> 45 units qhs - NovoLog: 20 units with a smaller meal (am: 20-22) 25 units with a regular meal (usually with b'fast and lunch)  30 units with a large meal (usually with dinner) - Metformin XR 500 mg at lunchtime and 1000 mg at dinner - started 09/2013 She stopped Victoza >> nausea, 3 years. She tried Actos. She tried regular Metformin >> nausea. She tried Prandin. She tried Januvia.  She lost 15 lbs in last year, now  plateaued.  Pt checks her sugars 2-3x a day and they are: - am: 105-112 >> 90-128 (200) >> 76-145 >> 112-120 mostly - 2h after b'fast: n/c >> 195, 284 >> 90, 192, 250 >> 120-140 - before lunch: 99-120 >> 120-152, 281 >> 71-199 >> 120-140 - 2h after lunch: n/c >> 92-156 >> 81-210 >> n/c - before dinner: n/c >> 90-150 (190) >> 71-190 >> 90-100 - 2h after dinner: n/c >> 197-281 >> 65-251 >> 220 - bedtime: 160-230 >> 160-180s (244) >> 96-205 >> 189-190 - nighttime: n/c >> 180, 194 >> 113, 295 >> 3 am: 55 x1 Has lows. Lowest sugar was 55 x1, 70 x1; she has hypoglycemia awareness at 70.  Highest sugar was 200.   Meter: AccuChek Aviva.  Pt's meals are: - Breakfast: yoghurt + fruit - Lunch: salad with [place] - Dinner: chicken, broccoli, meat - Snacks: 2-3: fruit, popcorn  - no CKD, last BUN/creatinine:  Lab Results  Component Value Date   BUN 6 02/22/2008   CREATININE 0.52 02/22/2008  On Losartan. - Has HL. No lipid panel available. - last eye exam was in 01/2013. No DR.  - no numbness and tingling in her feet.  I reviewed pt's medications, allergies, PMH, social hx, family hx and no changes required, except as mentioned above.  ROS: Constitutional: no weight gain/loss, + fatigue, + hot flushes, + poor sleep, +  nocturia Eyes: + blurry vision, no xerophthalmia ENT: + sore throat, no nodules palpated in throat, no dysphagia/odynophagia, no hoarseness, + tinnitus, + hypoacusis Cardiovascular: + CP/+ SOB/no palpitations/no leg swelling Respiratory: no cough/SOB Gastrointestinal: + N/no V/D/C/heartburn Musculoskeletal: no muscle aches/joint aches Skin: no rashes, + hair loss, + itching Neurological: no tremors/numbness/tingling/dizziness, + HA  PE: BP 140/92  Pulse 65  Temp(Src) 98.4 F (36.9 C) (Oral)  Resp 12  Wt 236 lb (107.049 kg)  SpO2 97% Wt Readings from Last 3 Encounters:  11/09/13 236 lb (107.049 kg)  10/08/13 235 lb (106.595 kg)  09/10/13 232 lb 12.8 oz (105.597  kg)   Constitutional: overweight, in NAD Eyes: PERRLA, EOMI, no exophthalmos ENT: moist mucous membranes, no thyromegaly, no cervical lymphadenopathy Cardiovascular: RRR, No MRG Respiratory: CTA B Gastrointestinal: abdomen soft, NT, ND, BS+ Musculoskeletal: no deformities, strength intact in all 4 Skin: moist, warm, no rashes Neurological: no tremor with outstretched hands, DTR normal in all 4  ASSESSMENT: 1. DM2, insulin-dependent, uncontrolled, without complications  2. Hypothyroidism - postsurgical - h/o benign MNG - thyroidectomy 2011  PLAN:  1. Patient with long-standing, recently more uncontrolled diabetes, on basal-bolus, still with fluctuating sugars, but improved throughout the day. Sugars still high after dinner. We spent a lot of time discussing nutrition and how to gauge her insulin to her meals - will also refer to nutrition.    - I suggested to:  Patient Instructions  Please stop at the lab. Please continue Lantus 45 units at bedtime Can stay of Metformin for now. Continue NovoLog: 20 units with a smaller meal  25 units with a regular meal   30 units with a large meal >> can increase to 35 units with dinner if you have carbohydrates with dinner We will refer you to nutrition. Please return in 1.5 months with your sugar log.  - continue checking sugars at different times of the day - check 3 times a day, rotating checks - up to date with eye exams - will check HbA1c now - Return to clinic in 1.5 mo with sugar log   2. Hypothyroidism - we decreased the Synthroid dose at last visit - to 125 mcg daily - she still feels tired and unhappy 2/2 weight gain (only gained 1 lb since last visit ) - will check TFTs today  Office Visit on 11/09/2013  Component Date Value Ref Range Status  . TSH 11/09/2013 0.21* 0.35 - 4.50 uIU/mL Final  . Free T4 11/09/2013 1.24  0.60 - 1.60 ng/dL Final  . Hemoglobin Z6X 11/09/2013 7.9* 4.6 - 6.5 % Final   Glycemic Control Guidelines  for People with Diabetes:Non Diabetic:  <6%Goal of Therapy: <7%Additional Action Suggested:  >8%   HbA1c better. TSH also better. Not normal, but will leave her on the same dose of Levothyroxine for now. I will recheck her tests when she comes back.

## 2013-11-09 NOTE — Patient Instructions (Addendum)
Please stop at the lab. Please continue Lantus 45 units at bedtime Can stay of Metformin for now. Continue NovoLog: 20 units with a smaller meal  25 units with a regular meal   30 units with a large meal >> can increase to 35 units with dinner if you have carbohydrates with dinner We will refer you to nutrition. Please return in 1.5 months with your sugar log.

## 2013-12-04 ENCOUNTER — Ambulatory Visit: Payer: 59

## 2013-12-10 ENCOUNTER — Ambulatory Visit: Payer: 59 | Attending: Family Medicine | Admitting: Physical Therapy

## 2013-12-10 DIAGNOSIS — J45909 Unspecified asthma, uncomplicated: Secondary | ICD-10-CM | POA: Insufficient documentation

## 2013-12-10 DIAGNOSIS — M545 Low back pain, unspecified: Secondary | ICD-10-CM | POA: Diagnosis not present

## 2013-12-10 DIAGNOSIS — IMO0001 Reserved for inherently not codable concepts without codable children: Secondary | ICD-10-CM | POA: Diagnosis present

## 2013-12-10 DIAGNOSIS — E119 Type 2 diabetes mellitus without complications: Secondary | ICD-10-CM | POA: Insufficient documentation

## 2013-12-20 ENCOUNTER — Ambulatory Visit: Payer: 59 | Attending: Family Medicine | Admitting: Rehabilitation

## 2013-12-20 DIAGNOSIS — M545 Low back pain, unspecified: Secondary | ICD-10-CM | POA: Diagnosis not present

## 2013-12-20 DIAGNOSIS — E119 Type 2 diabetes mellitus without complications: Secondary | ICD-10-CM | POA: Insufficient documentation

## 2013-12-20 DIAGNOSIS — IMO0001 Reserved for inherently not codable concepts without codable children: Secondary | ICD-10-CM | POA: Diagnosis not present

## 2013-12-20 DIAGNOSIS — J45909 Unspecified asthma, uncomplicated: Secondary | ICD-10-CM | POA: Insufficient documentation

## 2013-12-25 ENCOUNTER — Ambulatory Visit: Payer: 59 | Admitting: Rehabilitation

## 2013-12-25 DIAGNOSIS — IMO0001 Reserved for inherently not codable concepts without codable children: Secondary | ICD-10-CM | POA: Diagnosis not present

## 2013-12-27 ENCOUNTER — Ambulatory Visit: Payer: 59 | Admitting: Internal Medicine

## 2014-01-02 ENCOUNTER — Encounter: Payer: 59 | Admitting: Rehabilitation

## 2014-01-10 ENCOUNTER — Ambulatory Visit: Payer: 59 | Attending: Family Medicine | Admitting: Physical Therapy

## 2014-01-10 DIAGNOSIS — E119 Type 2 diabetes mellitus without complications: Secondary | ICD-10-CM | POA: Insufficient documentation

## 2014-01-10 DIAGNOSIS — M545 Low back pain: Secondary | ICD-10-CM | POA: Insufficient documentation

## 2014-01-10 DIAGNOSIS — J45909 Unspecified asthma, uncomplicated: Secondary | ICD-10-CM | POA: Insufficient documentation

## 2014-01-15 ENCOUNTER — Ambulatory Visit: Payer: 59 | Admitting: Internal Medicine

## 2014-01-23 ENCOUNTER — Ambulatory Visit: Payer: 59 | Admitting: Rehabilitation

## 2014-02-06 ENCOUNTER — Ambulatory Visit: Payer: 59 | Admitting: Physical Therapy

## 2014-02-06 DIAGNOSIS — M545 Low back pain: Secondary | ICD-10-CM | POA: Diagnosis not present

## 2014-02-07 ENCOUNTER — Encounter: Payer: Self-pay | Admitting: Internal Medicine

## 2014-02-07 ENCOUNTER — Ambulatory Visit (INDEPENDENT_AMBULATORY_CARE_PROVIDER_SITE_OTHER): Payer: 59 | Admitting: Internal Medicine

## 2014-02-07 VITALS — BP 114/68 | HR 62 | Temp 98.0°F | Resp 12 | Wt 235.8 lb

## 2014-02-07 DIAGNOSIS — E039 Hypothyroidism, unspecified: Secondary | ICD-10-CM

## 2014-02-07 MED ORDER — CANAGLIFLOZIN 100 MG PO TABS: 100.0000 mg | ORAL_TABLET | Freq: Every day | ORAL | Status: DC

## 2014-02-07 NOTE — Patient Instructions (Addendum)
Please continue: - Lantus 55 units at night - NovoLog:  20 units with a smaller meal (am: 20-22)  25 units with a regular meal (usually with b'fast and lunch)  30 units with a large meal (usually with dinner)  Please start Invokana 100 mg daily in am.   Please return in 1 month with your sugar log.

## 2014-02-07 NOTE — Progress Notes (Signed)
Patient ID: Katie Cook, female   DOB: 1955-09-09, 58 y.o.   MRN: [id number]  HPI: Katie Cook is a 58 y.o.-year-old female, returning for f/u for DM2, insulin-dependent, uncontrolled, without complications and postsurgical hypothyroidism (after she had compression sx with MNG - noncancerous). Last visit 3 mo ago.  Hypothyroidism: Reviewed hx: - she had MNG >> developed compression sxs - thyroidectomy in 2011-2012 - hypothyroidism >> Levothyroxine, then Armour 6 x later: 180 mg 4/7 days and 120 in 3/7 days >> b/c insurance coverage >> switched to Levothyroxine 200 mcg daily. She feels more tired on it.  She takes the LT4: - in am - fasting - eats b'fast >30 min later: yoghurt - she takes Omeprazole prn later in the day - takes MVI in pm or evening  Last TSH was still suppressed: Lab Results  Component Value Date   TSH 0.21* 11/09/2013   TSH 0.07* 10/08/2013   TSH 0.05* 09/10/2013   TSH 0.04* 07/26/2013   FREET4 1.24 11/09/2013   FREET4 1.38 10/08/2013   FREET4 1.51 09/10/2013   FREET4 1.96* 07/26/2013  04/2013: TSH 0.4 (at the [facility])  We decreased the LT4 to 150 >> 125 mcg daily.  She feels tired and is not having success with losing weight despite having worked on her diet since the last visit.  DM2: Patient has been diagnosed with GDM during pregnancy, then DM2 in 2005; she started insulin in 2008.   Last hemoglobin A1c was:  Lab Results  Component Value Date   HGBA1C 7.9* 11/09/2013   HGBA1C 8.1* 07/26/2013  ~8% 04/2013.  Pt is on a regimen of: - Lantus 55 >> 45 >> 50 >> 45 units qhs - NovoLog: 20 units with a smaller meal (am: 20-22) 25 units with a regular meal (usually with b'fast and lunch)  30 units with a large meal (usually with dinner) - Metformin XR 500 mg at lunchtime and 1000 mg at dinner - started 09/2013 She stopped Victoza >> nausea, 3 years. She tried Actos. She tried regular Metformin >> nausea. She tried Prandin. She tried Januvia.  Pt  checks her sugars 2-3x a day and they are fluctuating, but overall higher than before: - am: 105-112 >> 90-128 (200) >> 76-145 >> 112-120 mostly >> 55-110 - 2h after b'fast: n/c >> 195, 284 >> 90, 192, 250 >> 120-140 >> 74, 207 - before lunch: 99-120 >> 120-152, 281 >> 71-199 >> 120-140 >> 77, 169, 269 - 2h after lunch: n/c >> 92-156 >> 81-210 >> n/c >> 201-210 - before dinner: n/c >> 90-150 (190) >> 71-190 >> 90-100 >> 102-130, 260 - 2h after dinner: n/c >> 197-281 >> 65-251 >> 220 >> 122, 177-260, 395 - bedtime: 160-230 >> 160-180s (244) >> 96-205 >> 189-190 >> 130, 200-275 - nighttime: n/c >> 180, 194 >> 113, 295 >> 3 am: 55 x1 >> 67, 262 Has lows. Lowest sugar was 55 x1; she has hypoglycemia awareness at 70.  Highest sugar was 395.   Meter: AccuChek Aviva.  Pt's meals are: - Breakfast: yoghurt + fruit - Lunch: salad with [place] - Dinner: chicken, broccoli, meat - Snacks: 2-3: fruit, popcorn  - no CKD, last BUN/creatinine:  Lab Results  Component Value Date   BUN 6 02/22/2008   CREATININE 0.52 02/22/2008  On Losartan. - Has HL. No lipid panel available. - last eye exam was in 01/2013. No DR.  - no numbness and tingling in her feet.  I reviewed pt's medications, allergies, PMH, social  hx, family hx and no changes required, except as mentioned above.  ROS: Constitutional: no weight gain/loss, + fatigue, + hot flushes, + poor sleep, no nocturia Eyes: no blurry vision, no xerophthalmia ENT: + sore throat, no nodules palpated in throat, no dysphagia/odynophagia, no hoarseness, + tinnitus, + hypoacusis Cardiovascular: no CP/+ SOB/no palpitations/no leg swelling Respiratory: no cough/SOB Gastrointestinal: + N/no V/D/C/+ heartburn Musculoskeletal: no muscle aches/joint aches Skin: no rashes, no hair loss Neurological: no tremors/numbness/tingling/dizziness, + HA  PE: BP 114/68  Pulse 62  Temp(Src) 98 F (36.7 C) (Oral)  Resp 12  Wt 235 lb 12.8 oz (106.958 kg)  SpO2  97% Wt Readings from Last 3 Encounters:  02/07/14 235 lb 12.8 oz (106.958 kg)  11/09/13 236 lb (107.049 kg)  10/08/13 235 lb (106.595 kg)   Constitutional: overweight, in NAD Eyes: PERRLA, EOMI, no exophthalmos ENT: moist mucous membranes, no thyromegaly, no cervical lymphadenopathy Cardiovascular: RRR, No MRG Respiratory: CTA B Gastrointestinal: abdomen soft, NT, ND, BS+ Musculoskeletal: no deformities, strength intact in all 4 Skin: moist, warm, no rashes Neurological: no tremor with outstretched hands, DTR normal in all 4  ASSESSMENT: 1. DM2, insulin-dependent, uncontrolled, without complications  2. Hypothyroidism - postsurgical - thyroidectomy 2011 - h/o benign MNG  PLAN:  1. Patient with long-standing, recently more uncontrolled diabetes, on basal-bolus insulin regimen, still with fluctuating sugars, worsened.  At last visit, we spent a lot of time discussing nutrition and how to gauge her insulin to her meals and also referred her to nutrition (pending appt) >> she tried to change her diet and worked hard on cutting down portions and carbs w/o success in wt loss and with higher sugars >> she is again, very frustrated with this... - To work on both her DM and weight, I suggested to add Invokana: Patient Instructions  Please continue: - Lantus 55 units at night - NovoLog:  20 units with a smaller meal (am: 20-22)  25 units with a regular meal (usually with b'fast and lunch)  30 units with a large meal (usually with dinner)  Please start Invokana 100 mg daily in am.   Please return in 1 month with your sugar log.  - we discussed about SEs of Invokana, which are: dizziness (advised to be careful when stands from sitting position), decreased BP - usually not < normal (BP today is not low), and fungal UTIs (advised to let me know if develops one).  - given discount card for Invokana - continue checking sugars at different times of the day - check 3 times a day, rotating  checks - up to date with eye exams - will check HbA1c in 2 days - will need to obtain records from Dr Tenny Crawoss Re: her latest Lipid panel and CMP - Return to clinic in 1 mo with sugar log >> needs a potassium check then  2. Hypothyroidism - on Synthroid 125 mcg daily - last TSH a little low at last check but we left the dose the same - will check TFTs in 2 days - she still feels tired and unhappy 2/2 weight gain, but explained this is not 2/2 thyroid ds  Component     Latest Ref Rng 02/11/2014  TSH     0.35 - 4.50 uIU/mL 8.94 (H)  Free T4     0.60 - 1.60 ng/dL 5.281.02  Hgb U1LA1c MFr Bld     4.6 - 6.5 % 8.1 (H)   HbA1c higher >> we started Invokana. TSH high, while at last  check on the same dose it was low... Will ask her if she missed any doses of LT4. Will continue same dose for now and recheck thyroid labs in 1 mo.

## 2014-02-11 ENCOUNTER — Other Ambulatory Visit (INDEPENDENT_AMBULATORY_CARE_PROVIDER_SITE_OTHER): Payer: 59

## 2014-02-11 DIAGNOSIS — E039 Hypothyroidism, unspecified: Secondary | ICD-10-CM

## 2014-02-11 LAB — TSH: TSH: 8.94 u[IU]/mL — ABNORMAL HIGH (ref 0.35–4.50)

## 2014-02-11 LAB — T4, FREE: Free T4: 1.02 ng/dL (ref 0.60–1.60)

## 2014-02-11 LAB — HEMOGLOBIN A1C: Hgb A1c MFr Bld: 8.1 % — ABNORMAL HIGH (ref 4.6–6.5)

## 2014-02-22 ENCOUNTER — Telehealth: Payer: Self-pay | Admitting: *Deleted

## 2014-02-22 NOTE — Telephone Encounter (Signed)
Pt called and lvm stating that she is taking her medication every day, what should she do. Returned pt's call and lvm advising her per Dr Charlean SanfilippoGherghe's note to stay on the same dose and she will recheck her levels in 4 weeks.

## 2014-03-15 ENCOUNTER — Ambulatory Visit: Payer: 59 | Admitting: Internal Medicine

## 2014-03-22 ENCOUNTER — Encounter: Payer: Self-pay | Admitting: Internal Medicine

## 2014-03-22 ENCOUNTER — Other Ambulatory Visit: Payer: Self-pay | Admitting: Internal Medicine

## 2014-03-22 ENCOUNTER — Ambulatory Visit (INDEPENDENT_AMBULATORY_CARE_PROVIDER_SITE_OTHER): Payer: 59 | Admitting: Internal Medicine

## 2014-03-22 VITALS — BP 128/82 | HR 87 | Wt 232.0 lb

## 2014-03-22 DIAGNOSIS — IMO0001 Reserved for inherently not codable concepts without codable children: Secondary | ICD-10-CM

## 2014-03-22 DIAGNOSIS — E1165 Type 2 diabetes mellitus with hyperglycemia: Secondary | ICD-10-CM

## 2014-03-22 DIAGNOSIS — E039 Hypothyroidism, unspecified: Secondary | ICD-10-CM

## 2014-03-22 LAB — T4, FREE: Free T4: 1.19 ng/dL (ref 0.80–1.80)

## 2014-03-22 LAB — TSH: TSH: 18.03 u[IU]/mL — ABNORMAL HIGH (ref 0.350–4.500)

## 2014-03-22 NOTE — Patient Instructions (Signed)
Please stop at [facility] lab downstairs.  I will let you know about the dose of Levothyroxine after the results are back.  Please continue: - Lantus 35 units at night Please increase: - NovoLog:  20 >> 22 units with a smaller meal  25 >> 27 units with a regular meal (usually with b'fast and lunch)  30 >> 33 units with a large meal (usually with dinner)  Please re-start Invokana 100 mg daily in am after the diarrhea has resolved.  Please come back for a follow-up appointment in 3 months

## 2014-03-22 NOTE — Progress Notes (Signed)
Patient ID: Katie Cook, female   DOB: 10-16-1955, 58 y.o.   MRN: [id number]  HPI: Katie SicilianMaria E Cook is a 58 y.o.-year-old female, returning for f/u for DM2, insulin-dependent, uncontrolled, without complications and postsurgical hypothyroidism (after she had compression sx with MNG - noncancerous). Last visit 1.5 mo ago.  She had diarrhea last week >> held Invokana 5 days ago (has been taking it for 1 mo). She still has nausea/diarrhea. She is on Zofran.  Hypothyroidism: Reviewed hx: - she had MNG >> developed compression sxs - thyroidectomy in 2011-2012 - hypothyroidism >> Levothyroxine, then Armour 6 x later: 180 mg 4/7 days and 120 in 3/7 days >> b/c insurance coverage >> switched to Levothyroxine 200 mcg daily. She feels more tired on it.  She takes the LT4: - in am - fasting - eats b'fast >30 min later: yoghurt - she takes Omeprazole prn later in the day - takes MVI in pm or evening  Last TSH was still suppressed: Lab Results  Component Value Date   TSH 8.94* 02/11/2014   TSH 0.21* 11/09/2013   TSH 0.07* 10/08/2013   TSH 0.05* 09/10/2013   TSH 0.04* 07/26/2013   FREET4 1.02 02/11/2014   FREET4 1.24 11/09/2013   FREET4 1.38 10/08/2013   FREET4 1.51 09/10/2013   FREET4 1.96* 07/26/2013  04/2013: TSH 0.4 (at the [facility])  She was on LT4 125 mcg daily, we did not change the dose after last TSH level. Hiowever, she tells me that she actually got a refill of the 100 mcg LT4 instead of the 125 mcg >> unclear how she got this...  She feels tired, but better than last visit and started to have some success with losing weight, but this may be 2/2 diarrhea, she believes.  DM2: Patient has been diagnosed with GDM during pregnancy, then DM2 in 2005; she started insulin in 2008.   Last hemoglobin A1c was:  Lab Results  Component Value Date   HGBA1C 8.1* 02/11/2014   HGBA1C 7.9* 11/09/2013   HGBA1C 8.1* 07/26/2013  ~8% 04/2013.  Pt is on a regimen of: - Lantus 35 units  qhs - NovoLog: 20 units with a smaller meal (am: 20-22) 25 units with a regular meal (usually with b'fast and lunch)  30 units with a large meal (usually with dinner) - Invokana 100 mg - added 01/2014 >> now held x 5 days. However, she feels great on it!  She stopped  Metformin XR 500 mg at lunchtime and 1000 mg at dinner - started 09/2013 She stopped Victoza >> nausea, 3 years. She tried Actos. She tried regular Metformin >> nausea. She tried Prandin. She tried Januvia.  Pt checks her sugars 2-3x a day and they are fluctuating, but overall higher than before: - am: 105-112 >> 90-128 (200) >> 76-145 >> 112-120 mostly >> 55-110 >> 70-120, 137 - 2h after b'fast: n/c >> 195, 284 >> 90, 192, 250 >> 120-140 >> 74, 207 >> 141-157, 197 - before lunch: 99-120 >> 120-152, 281 >> 71-199 >> 120-140 >> 77, 169, 269 >> 164-182, 251 - 2h after lunch: n/c >> 92-156 >> 81-210 >> n/c >> 201-210 >> 77, 125-190 - before dinner: n/c >> 90-150 (190) >> 71-190 >> 90-100 >> 102-130, 260 >> 124-161 - 2h after dinner: n/c >> 197-281 >> 65-251 >> 220 >> 122, 177-260, 395 >> 158, 171-250 - bedtime: 160-230 >> 160-180s (244) >> 96-205 >> 189-190 >> 130, 200-275 >> 125-195 - nighttime: n/c >> 180, 194 >> 113, 295 >>  3 am: 55 x1 >> 67, 262 >> n/c Has lows. Lowest sugar was 77 x1; she has hypoglycemia awareness at 70.  Highest sugar was 311 x1.   Meter: AccuChek Aviva.  Pt's meals are: - Breakfast: yoghurt + fruit - Lunch: salad with [place] - Dinner: chicken, broccoli, meat - Snacks: 2-3: fruit, popcorn  Reviewed latest labs - records from PCP: - no CKD, last BUN/creatinine:  03/19/2014: 17/0.75 Lab Results  Component Value Date   BUN 6 02/22/2008   CREATININE 0.52 02/22/2008  On Losartan. - Has HL.  03/19/2014: 183/138/52/103 - last eye exam was in 01/2014. No DR.  - no numbness and tingling in her feet.  I reviewed pt's medications, allergies, PMH, social hx, family hx and no changes required,  except as mentioned above.  ROS: Constitutional: no weight gain/loss, + fatigue, + hot flushes, + poor sleep, no nocturia Eyes: no blurry vision, no xerophthalmia ENT: no sore throat, no nodules palpated in throat, no dysphagia/odynophagia, no hoarseness, + tinnitus, + hypoacusis Cardiovascular: no CP/+ SOB/no palpitations/no leg swelling Respiratory: no cough/SOB Gastrointestinal: + N/no V/D/C/+ heartburn Musculoskeletal: no muscle aches/joint aches Skin: no rashes, no hair loss Neurological: no tremors/numbness/tingling/dizziness, + HA  PE: BP 128/82 mmHg  Pulse 87  Wt 232 lb (105.235 kg)  SpO2 97% Body mass index is 43.86 kg/(m^2).  Wt Readings from Last 3 Encounters:  03/22/14 232 lb (105.235 kg)  02/07/14 235 lb 12.8 oz (106.958 kg)  11/09/13 236 lb (107.049 kg)   Constitutional: overweight, in NAD Eyes: PERRLA, EOMI, no exophthalmos ENT: moist mucous membranes, no thyromegaly, no cervical lymphadenopathy Cardiovascular: RRR, No MRG Respiratory: CTA B Gastrointestinal: abdomen soft, NT, ND, BS+ Musculoskeletal: no deformities, strength intact in all 4 Skin: moist, warm, no rashes Neurological: no tremor with outstretched hands, DTR normal in all 4  ASSESSMENT: 1. DM2, insulin-dependent, uncontrolled, without complications  2. Hypothyroidism - postsurgical - thyroidectomy 2011 - h/o benign MNG  PLAN:  1. Patient with long-standing, uncontrolled diabetes, on basal-bolus insulin regimen, and recently Invokana (now off 2/2 diarrhea). She decreased the dose of Lantus since last visit, but sugars in am are still good. Later, sugars can be higher. - I advised her to:  Patient Instructions  [facility] at [facility] lab downstairs.  I will let you know about the dose of Levothyroxine after the results are back.  Please continue: - Lantus 35 units at night Please increase: - NovoLog:  20 >> 22 units with a smaller meal  25 >> 27 units with a regular meal (usually with  b'fast and lunch)  30 >> 33 units with a large meal (usually with dinner)  Please re-start Invokana 100 mg daily in am after the diarrhea has resolved.  Please come back for a follow-up appointment in 3 months   - we spend a lot of time discussing sick day rules (different scenarios) as she mentions she did not know what to do with her insulin during the diarrhea episode. - continue checking sugars at different times of the day - check 3 times a day, rotating checks - up to date with eye exams - reviewed CMP checked by PCP ~ 1 mo on Invokana >> normal GFR and potassium - will not repeat - Return to clinic in 3 mo with sugar log   2. Hypothyroidism - I made sure pt is taking the LT4 correctly: every day, with water, >30 minutes before breakfast, separated by >4 hours from acid reflux medications, calcium, iron, multivitamins. -  on Synthroid 100 mcg daily (she filled this dose rather the 125 mcg - ???) - last TSH a little high at last check but we left the dose the same - will check TFTs today and we discussed that we will likely need to increase dose  She needs a refill of LT4 ([facility]) and Invokana 90 days to [facility] (done)  Orders Only on 03/22/2014  Component Date Value Ref Range Status  . TSH 03/22/2014 18.030* 0.350 - 4.500 uIU/mL Final  . Free T4 03/22/2014 1.19  0.80 - 1.80 ng/dL Final   Will increase the dose of LT4 back to 125 mcg for 2 weeks, then will alternate 125 with 150 mcg and repeat labs in 2 mo.  - time spent with the patient: 40 min, of which >50% was spent in obtaining information about her symptoms, reviewing her previous labs, evaluations, and treatments, counseling her about both diabetes and hypothyroidism (please see the discussed topics above), and developing a plan to further investigate and treat them. As always, she had multiple questions which I addressed.

## 2014-03-23 ENCOUNTER — Other Ambulatory Visit: Payer: Self-pay | Admitting: Internal Medicine

## 2014-03-23 MED ORDER — LEVOTHYROXINE SODIUM 150 MCG PO TABS: ORAL_TABLET | ORAL | Status: DC

## 2014-03-23 MED ORDER — CANAGLIFLOZIN 100 MG PO TABS: 100.0000 mg | ORAL_TABLET | Freq: Every day | ORAL | Status: DC

## 2014-03-23 MED ORDER — LEVOTHYROXINE SODIUM 125 MCG PO TABS: ORAL_TABLET | ORAL | Status: DC

## 2014-03-25 ENCOUNTER — Other Ambulatory Visit: Payer: Self-pay | Admitting: *Deleted

## 2014-03-25 ENCOUNTER — Telehealth: Payer: Self-pay | Admitting: Internal Medicine

## 2014-03-25 ENCOUNTER — Other Ambulatory Visit: Payer: Self-pay | Admitting: Internal Medicine

## 2014-03-25 MED ORDER — LEVOTHYROXINE SODIUM 137 MCG PO TABS: 137.0000 ug | ORAL_TABLET | Freq: Every day | ORAL | Status: DC

## 2014-03-25 NOTE — Telephone Encounter (Signed)
Patient stated that she was prescribed125 mg of levothyroxine 15 pills and 150 mg of levothyroxine 15 pills, Pharmacy said if Dr Elvera LennoxGherghe would prescribe her 137 mg of levothyroxine 30 pill she would only have to pay one co pay per month.Please advise

## 2014-03-25 NOTE — Telephone Encounter (Signed)
Yes, let's do that - can you please send this Rx? 137 mcg #45 1 refill

## 2014-03-26 ENCOUNTER — Encounter: Payer: Self-pay | Admitting: *Deleted

## 2014-04-08 ENCOUNTER — Other Ambulatory Visit: Payer: Self-pay | Admitting: Family Medicine

## 2014-04-08 DIAGNOSIS — R109 Unspecified abdominal pain: Secondary | ICD-10-CM

## 2014-04-11 ENCOUNTER — Inpatient Hospital Stay: Admission: RE | Admit: 2014-04-11 | Payer: 59 | Source: Ambulatory Visit

## 2014-04-24 ENCOUNTER — Other Ambulatory Visit: Payer: Self-pay | Admitting: Gastroenterology

## 2014-06-10 ENCOUNTER — Encounter: Payer: Self-pay | Admitting: Internal Medicine

## 2014-06-10 ENCOUNTER — Ambulatory Visit (INDEPENDENT_AMBULATORY_CARE_PROVIDER_SITE_OTHER): Payer: Medicaid Other | Admitting: Internal Medicine

## 2014-06-10 ENCOUNTER — Other Ambulatory Visit: Payer: Self-pay | Admitting: *Deleted

## 2014-06-10 VITALS — BP 122/78 | HR 78 | Temp 98.3°F | Resp 12 | Wt 233.0 lb

## 2014-06-10 DIAGNOSIS — E039 Hypothyroidism, unspecified: Secondary | ICD-10-CM

## 2014-06-10 DIAGNOSIS — IMO0001 Reserved for inherently not codable concepts without codable children: Secondary | ICD-10-CM

## 2014-06-10 DIAGNOSIS — E1165 Type 2 diabetes mellitus with hyperglycemia: Secondary | ICD-10-CM

## 2014-06-10 LAB — T4, FREE: Free T4: 1.22 ng/dL (ref 0.60–1.60)

## 2014-06-10 LAB — TSH: TSH: 0.71 u[IU]/mL (ref 0.35–4.50)

## 2014-06-10 LAB — HEMOGLOBIN A1C: Hgb A1c MFr Bld: 8.4 % — ABNORMAL HIGH (ref 4.6–6.5)

## 2014-06-10 MED ORDER — CANAGLIFLOZIN 100 MG PO TABS: 100.0000 mg | ORAL_TABLET | Freq: Every day | ORAL | Status: DC

## 2014-06-10 NOTE — Patient Instructions (Signed)
Please keep Lantus at 35 units at bedtime. Please change the NovoLog doses: Breakfast: 30 units Lunch: 15 units Dinner: 27-30 units  Please let me know about Victoza.  Please stop at the lab.  Please come back for a follow-up appointment in 3 months

## 2014-06-10 NOTE — Progress Notes (Signed)
Patient ID: Katie Cook, female   DOB: 1955/06/16, 59 y.o.   MRN: [id number]  HPI: Katie Cook is a 59 y.o.-year-old female, returning for f/u for DM2, insulin-dependent, uncontrolled, without complications and postsurgical hypothyroidism (after she had compression sx with MNG - noncancerous). Last visit 2.5 mo ago.  Since last visit >> back pain >> CT scan >> colon masses >> colonoscopy >> polyps: benign.  Hypothyroidism: Reviewed hx: - she had MNG >> developed compression sxs - thyroidectomy in 2011-2012 - hypothyroidism >> Levothyroxine, then Armour 6 x later: 180 mg 4/7 days and 120 in 3/7 days >> b/c insurance coverage >> switched to Levothyroxine 200 mcg daily. She feels more tired on it.  She takes the LT4: - in am - fasting - eats b'fast >30 min later: yoghurt - she takes Omeprazole prn later in the day - takes MVI in pm or evening  Last TSH:  Lab Results  Component Value Date   TSH 18.030* 03/22/2014   TSH 8.94* 02/11/2014   TSH 0.21* 11/09/2013   TSH 0.07* 10/08/2013   TSH 0.05* 09/10/2013   FREET4 1.19 03/22/2014   FREET4 1.02 02/11/2014   FREET4 1.24 11/09/2013   FREET4 1.38 10/08/2013   FREET4 1.51 09/10/2013  04/2013: TSH 0.4 (at the [facility])  At last visit, we increased the dose of LT4 to 137 g.  DM2: Patient has been diagnosed with GDM during pregnancy, then DM2 in 2005; she started insulin in 2008.   Last hemoglobin A1c was:  Lab Results  Component Value Date   HGBA1C 8.1* 02/11/2014   HGBA1C 7.9* 11/09/2013   HGBA1C 8.1* 07/26/2013  ~8% 04/2013.  Pt is on a regimen of: - Lantus 35 units qhs - NovoLog: 25 units - b'fast 27-30 units - dinner Invokana 100 mg - added 01/2014 >> stopped for diarrhea.  She stopped  Metformin XR 500 mg at lunchtime and 1000 mg at dinner - started 09/2013 She stopped Victoza >> nausea, 3 years. She tried Actos. She tried regular Metformin >> nausea. She tried Prandin. She tried Januvia.  Pt checks her  sugars 2-3x a day and they are fluctuating, but overall higher than before: - am: 105-112 >> 90-128 (200) >> 76-145 >> 112-120 mostly >> 55-110 >> 70-120, 137 >> 77, 90-120 - 2h after b'fast: n/c >> 195, 284 >> 90, 192, 250 >> 120-140 >> 74, 207 >> 141-157, 197 >> 200 - before lunch: 99-120 >> 120-152, 281 >> 71-199 >> 120-140 >> 77, 169, 269 >> 164-182, 251 >> 200-248 - 2h after lunch: n/c >> 92-156 >> 81-210 >> n/c >> 201-210 >> 77, 125-190 >> 108-147, 166, 300 - before dinner: n/c >> 90-150 (190) >> 71-190 >> 90-100 >> 102-130, 260 >> 124-161 >> 69x1, 192-232 - 2h after dinner: n/c >> 197-281 >> 65-251 >> 220 >> 122, 177-260, 395 >> 158, 171-250 >> 190-260 - bedtime: 160-230 >> 160-180s (244) >> 96-205 >> 189-190 >> 130, 200-275 >> 125-195 >> 125-190 - nighttime: n/c >> 180, 194 >> 113, 295 >> 3 am: 55 x1 >> 67, 262 >> n/c >> 250 Has lows. Lowest sugar was 77 x1; she has hypoglycemia awareness at 70.  Highest sugar was 311 x1.   Meter: AccuChek Aviva.  Pt's meals are: - Breakfast: yoghurt + fruit - Lunch: salad with [place] - Dinner: chicken, broccoli, meat - Snacks: 2-3: fruit, popcorn  Reviewed latest labs - records from PCP: - no CKD, last BUN/creatinine:  03/19/2014: 17/0.75 Lab Results  Component  Value Date   BUN 6 02/22/2008   CREATININE 0.52 02/22/2008  On Losartan. - Has HL.  03/19/2014: 183/138/52/103 - last eye exam was in 01/2014. No DR.  - no numbness and tingling in her feet.  I reviewed pt's medications, allergies, PMH, social hx, family hx and no changes required, except as mentioned above.  ROS: Constitutional: no weight gain/loss, no fatigue, + hot flushes, + poor sleep, no nocturia Eyes: no blurry vision, no xerophthalmia ENT: no sore throat, no nodules palpated in throat, no dysphagia/odynophagia, no hoarseness, + tinnitus, + hypoacusis Cardiovascular: no CP/SOB/no palpitations/no leg swelling Respiratory: no cough/SOB Gastrointestinal: no N/V/D/C/+  heartburn Musculoskeletal: no muscle aches/joint aches Skin: no rashes, no hair loss Neurological: no tremors/numbness/tingling/dizziness  PE: BP 122/78 mmHg  Pulse 78  Temp(Src) 98.3 F (36.8 C) (Oral)  Resp 12  Wt 233 lb (105.688 kg)  SpO2 97% Body mass index is 44.05 kg/(m^2).  Wt Readings from Last 3 Encounters:  06/10/14 233 lb (105.688 kg)  03/22/14 232 lb (105.235 kg)  02/07/14 235 lb 12.8 oz (106.958 kg)   Constitutional: overweight, in NAD Eyes: PERRLA, EOMI, no exophthalmos ENT: moist mucous membranes, no thyromegaly, no cervical lymphadenopathy Cardiovascular: RRR, No MRG Respiratory: CTA B Gastrointestinal: abdomen soft, NT, ND, BS+ Musculoskeletal: no deformities, strength intact in all 4 Skin: moist, warm, no rashes Neurological: no tremor with outstretched hands, DTR normal in all 4  ASSESSMENT: 1. DM2, insulin-dependent, uncontrolled, without complications  2. Hypothyroidism - postsurgical - thyroidectomy 2011 - h/o benign MNG  PLAN:  1. Patient with long-standing, uncontrolled diabetes, on basal-bolus insulin regimen, now off Invokana (2/2 diarrhea, but she does not want to restart it for now). She is not taking insulin with lunch >> sugars at dinner higher. Also, sugars after b'fast higher >> will increase NovoLog with b'fast and add NovoLog with lunh. - I advised her to:  Patient Instructions  Please keep Lantus at 35 units at bedtime. Please change the NovoLog doses: Breakfast: 30 units Lunch: 15 units Dinner: 27-30 units  Please let me know about Victoza.  Please stop at the lab.  Please come back for a follow-up appointment in 3 months  - continue checking sugars at different times of the day - check 3 times a day, rotating checks - up to date with eye exams - check Hba1c today - Return to clinic in 3 mo with sugar log   2. Hypothyroidism - pt is taking the LT4 correctly: every day, with water, >30 minutes before breakfast, separated by  >4 hours from acid reflux medications, calcium, iron, multivitamins. - on Synthroid 137 mcg daily (increased at last visit) - will check TFTs today   After the visit, pt decided to restart Invokana >> will add at 100 mg daily.   Office Visit on 06/10/2014  Component Date Value Ref Range Status  . TSH 06/10/2014 0.71  0.35 - 4.50 uIU/mL Final  . Free T4 06/10/2014 1.22  0.60 - 1.60 ng/dL Final  . Hgb [id number] MFr Bld 06/10/2014 8.4* 4.6 - 6.5 % Final   Glycemic Control Guidelines for People with Diabetes:Non Diabetic:  <6%Goal of Therapy: <7%Additional Action Suggested:  >8%    TFTs are great! HbA1c is a little higher. Will see how Invokana helps. Continue to also increase the insulin as discussed for now, but she will need to let me know if she has any lows.

## 2014-06-25 ENCOUNTER — Other Ambulatory Visit: Payer: Self-pay | Admitting: Internal Medicine

## 2014-07-01 ENCOUNTER — Other Ambulatory Visit: Payer: Self-pay

## 2014-07-01 MED ORDER — LEVOTHYROXINE SODIUM 137 MCG PO TABS: ORAL_TABLET | ORAL | Status: DC

## 2014-07-01 MED ORDER — GLUCOSE BLOOD VI STRP: ORAL_STRIP | Status: DC

## 2014-08-19 ENCOUNTER — Other Ambulatory Visit: Payer: Self-pay | Admitting: *Deleted

## 2014-08-19 ENCOUNTER — Telehealth: Payer: Self-pay | Admitting: Endocrinology

## 2014-08-19 MED ORDER — ONETOUCH ULTRASOFT LANCETS MISC: Status: AC

## 2014-08-19 MED ORDER — GLUCOSE BLOOD VI STRP: ORAL_STRIP | Status: DC

## 2014-08-19 NOTE — Telephone Encounter (Signed)
Please call pt about her test strips , she needs refill on her test strips  (one touch gerio)

## 2014-08-19 NOTE — Telephone Encounter (Signed)
Refill sent to her [facility] day pharmacy. One Touch Verio.

## 2014-08-20 ENCOUNTER — Telehealth: Payer: Self-pay | Admitting: Internal Medicine

## 2014-08-20 ENCOUNTER — Other Ambulatory Visit: Payer: Self-pay | Admitting: *Deleted

## 2014-08-20 MED ORDER — GLUCOSE BLOOD VI STRP
ORAL_STRIP | Status: DC
Start: 2014-08-20 — End: 2014-10-15

## 2014-08-20 NOTE — Telephone Encounter (Signed)
Pt called and advised us that she now has a One Touch Verio Meter. She needs Verio strips sent to [facility].

## 2014-08-20 NOTE — Telephone Encounter (Signed)
[facility] has fax over a request for patient to get a One Touch Verio,  She need strips for that meter.

## 2014-08-20 NOTE — Telephone Encounter (Signed)
Called pt and lvm advising her that the rx for the test strips were sent in yesterday to [facility] at 1:26 pm.

## 2014-08-29 ENCOUNTER — Ambulatory Visit: Payer: 59 | Admitting: Internal Medicine

## 2014-09-10 ENCOUNTER — Ambulatory Visit: Payer: 59 | Admitting: Internal Medicine

## 2014-09-13 ENCOUNTER — Ambulatory Visit (INDEPENDENT_AMBULATORY_CARE_PROVIDER_SITE_OTHER): Payer: 59 | Admitting: Internal Medicine

## 2014-09-13 ENCOUNTER — Encounter: Payer: Self-pay | Admitting: Internal Medicine

## 2014-09-13 VITALS — BP 124/70 | HR 66 | Temp 98.1°F | Resp 12 | Wt 233.8 lb

## 2014-09-13 DIAGNOSIS — E039 Hypothyroidism, unspecified: Secondary | ICD-10-CM | POA: Diagnosis not present

## 2014-09-13 DIAGNOSIS — E1165 Type 2 diabetes mellitus with hyperglycemia: Secondary | ICD-10-CM | POA: Diagnosis not present

## 2014-09-13 DIAGNOSIS — IMO0001 Reserved for inherently not codable concepts without codable children: Secondary | ICD-10-CM

## 2014-09-13 LAB — BASIC METABOLIC PANEL WITH GFR
BUN: 19 mg/dL (ref 6–23)
CO2: 27 mEq/L (ref 19–32)
Calcium: 9.1 mg/dL (ref 8.4–10.5)
Chloride: 102 mEq/L (ref 96–112)
Creat: 0.47 mg/dL — ABNORMAL LOW (ref 0.50–1.10)
GFR, Est African American: >89 mL/min
GFR, Est Non African American: >89 mL/min
Glucose, Bld: 103 mg/dL — ABNORMAL HIGH (ref 70–99)
Potassium: 3.7 mEq/L (ref 3.5–5.3)
Sodium: 139 mEq/L (ref 135–145)

## 2014-09-13 LAB — HEMOGLOBIN A1C: Hgb A1c MFr Bld: 7.8 % — ABNORMAL HIGH (ref 4.6–6.5)

## 2014-09-13 MED ORDER — INSULIN GLARGINE 100 UNIT/ML ~~LOC~~ SOLN: 42.0000 [IU] | Freq: Every day | SUBCUTANEOUS | Status: DC

## 2014-09-13 NOTE — Progress Notes (Signed)
Patient ID: Katie Cook, female   DOB: 05/07/1955, 59 y.o.   MRN: [id number]  HPI: Katie Cook is a 59 y.o.-year-old female, returning for f/u for DM2, insulin-dependent, uncontrolled, without complications and postsurgical hypothyroidism (after she had compression sx with MNG - noncancerous). Last visit 2.5 mo ago.  Hypothyroidism: Reviewed hx: - she had MNG >> developed compression sxs - thyroidectomy in 2011-2012 - hypothyroidism >> Levothyroxine, then Armour 6 x later: 180 mg 4/7 days and 120 in 3/7 days >> b/c insurance coverage >> switched to Levothyroxine 200 mcg daily >> decreased gradually to 137 mcg.  She takes the LT4 137 g daily: - in am - fasting - eats b'fast >30 min later: yoghurt - she takes Omeprazole prn later in the day - takes MVI in pm or evening  Last TSH:  Lab Results  Component Value Date   TSH 0.71 06/10/2014   TSH 18.030* 03/22/2014   TSH 8.94* 02/11/2014   TSH 0.21* 11/09/2013   TSH 0.07* 10/08/2013   FREET4 1.22 06/10/2014   FREET4 1.19 03/22/2014   FREET4 1.02 02/11/2014   FREET4 1.24 11/09/2013   FREET4 1.38 10/08/2013  04/2013: TSH 0.4 (at the [facility])  DM2: Patient has been diagnosed with GDM during pregnancy, then DM2 in 2005; she started insulin in 2008.   Last hemoglobin A1c was:  Lab Results  Component Value Date   HGBA1C 8.4* 06/10/2014   HGBA1C 8.1* 02/11/2014   HGBA1C 7.9* 11/09/2013  ~8% 04/2013.  Pt is on a regimen of: - Lantus 38 units qhs - NovoLog: 27units - b'fast  27-35 units - dinner Invokana 100 mg - added 01/2014 >> initially stopped for diarrhea >> restarted 06/2014.  She stopped  Metformin XR 500 mg at lunchtime and 1000 mg at dinner - started 09/2013 She stopped Victoza >> nausea, 3 years. She tried Actos. She tried regular Metformin >> nausea. She tried Prandin. She tried Januvia.  Pt checks her sugars 2-3x a day and they are fluctuating, but higher than before in am (?): - am: 76-145 >> 112-120  mostly >> 55-110 >> 70-120, 137 >> 77, 90-120 >> 68, 122-260 - 2h after b'fast: 90, 192, 250 >> 120-140 >> 74, 207 >> 141-157, 197 >> 200 >> 111-228  - before lunch: 71-199 >> 120-140 >> 77, 169, 269 >> 164-182, 251 >> 200-248 >> 104 - 2h after lunch: 81-210 >> n/c >> 201-210 >> 77, 125-190 >> 108-147, 166, 300 >> 97, 106 - before dinner: 71-190 >> 90-100 >> 102-130, 260 >> 124-161 >> 69x1, 192-232 >> 181-210 - 2h after dinner: 65-251 >> 220 >> 122, 177-260, 395 >> 158, 171-250 >> 190-260 >> see above - bedtime: 96-205 >> 189-190 >> 130, 200-275 >> 125-195 >> 125-190 >> see above - nighttime: n/c >> 180, 194 >> 113, 295 >> 3 am: 55 x1 >> 67, 262 >> n/c >> 228, 250 Has lows. Lowest sugar was 77 x1; she has hypoglycemia awareness at 70.  Highest sugar was 311 x1.   Meter: AccuChek Aviva.  Pt's meals are: - Breakfast: yoghurt + fruit - Lunch: salad with [place] - Dinner: chicken, broccoli, meat - Snacks: 2-3: fruit, popcorn  Reviewed latest labs - records from PCP: - no CKD, last BUN/creatinine:  03/19/2014: 17/0.75 Lab Results  Component Value Date   BUN 6 02/22/2008   CREATININE 0.52 02/22/2008  On Losartan. - Has HL.  03/19/2014: 183/138/52/103 - last eye exam was in 01/2014. No DR.  - no numbness and  tingling in her feet.  I reviewed pt's medications, allergies, PMH, social hx, family hx and no changes required, except as mentioned above.  ROS: Constitutional: no weight gain, no fatigue, + hot flushes, + poor sleep, no nocturia Eyes: no blurry vision, no xerophthalmia ENT: no sore throat, no nodules palpated in throat, no dysphagia/odynophagia, no hoarseness, + tinnitus, + hypoacusis Cardiovascular: no CP/SOB/no palpitations/no leg swelling Respiratory: no cough/SOB Gastrointestinal: + N/no V/D/C/heartburn Musculoskeletal: no muscle aches/joint aches Skin: no rashes, no hair loss Neurological: no tremors/numbness/tingling/dizziness  PE: BP 124/70 mmHg  Pulse 66   Temp(Src) 98.1 F (36.7 C) (Oral)  Resp 12  Wt 233 lb 12.8 oz (106.051 kg)  SpO2 97% Body mass index is 44.2 kg/(m^2).  Wt Readings from Last 3 Encounters:  09/13/14 233 lb 12.8 oz (106.051 kg)  06/10/14 233 lb (105.688 kg)  03/22/14 232 lb (105.235 kg)   Constitutional: overweight, in NAD Eyes: PERRLA, EOMI, no exophthalmos ENT: moist mucous membranes, no thyromegaly, no cervical lymphadenopathy Cardiovascular: RRR, No MRG Respiratory: CTA B Gastrointestinal: abdomen soft, NT, ND, BS+ Musculoskeletal: no deformities, strength intact in all 4 Skin: moist, warm, no rashes Neurological: no tremor with outstretched hands, DTR normal in all 4  ASSESSMENT: 1. DM2, insulin-dependent, uncontrolled, without complications  2. Hypothyroidism - postsurgical - thyroidectomy 2011 - h/o benign MNG  PLAN:  1. Patient with long-standing, uncontrolled diabetes, on basal-bolus insulin regimen, now back on Invokana >> sugars better later in the day but higher in am >> will increase Lantus. - I advised her to:  Patient Instructions  Please increase: - Lantus to 42 units at bedtime  Please continue: - NovoLog: 27 units - with breakfast 27-35 units - with dinner - Invokana 100 mg in am  Please return in 3 months with your sugar log.   Please stop at the lab.  - continue checking sugars at different times of the day - check 3 times a day, rotating checks - up to date with eye exams - check Hba1c today - Return to clinic in 3 mo with sugar log   2. Hypothyroidism - pt is taking the LT4 correctly: every day, with water, >30 minutes before breakfast, separated by >4 hours from acid reflux medications, calcium, iron, multivitamins. - on Synthroid 137 mcg daily - Reviewed TFTs from last visit >> normal  Office Visit on 09/13/2014  Component Date Value Ref Range Status  . Hgb A1c MFr Bld 09/13/2014 7.8* 4.6 - 6.5 % Final   Glycemic Control Guidelines for People with Diabetes:Non  Diabetic:  <6%Goal of Therapy: <7%Additional Action Suggested:  >8%   . Sodium 09/13/2014 139  135 - 145 mEq/L Final  . Potassium 09/13/2014 3.7  3.5 - 5.3 mEq/L Final  . Chloride 09/13/2014 102  96 - 112 mEq/L Final  . CO2 09/13/2014 27  19 - 32 mEq/L Final  . Glucose, Bld 09/13/2014 103* 70 - 99 mg/dL Final  . BUN 09/13/2014 19  6 - 23 mg/dL Final  . Creat 09/13/2014 0.47* 0.50 - 1.10 mg/dL Final  . Calcium 09/13/2014 9.1  8.4 - 10.5 mg/dL Final  . GFR, Est African American 09/13/2014 >89   Final  . GFR, Est Non African American 09/13/2014 >89   Final   Comment:   The estimated GFR is a calculation valid for adults (>=109 years old) that uses the CKD-EPI algorithm to adjust for age and sex. It is   not to be used for children, pregnant women, hospitalized patients,  patients on dialysis, or with rapidly changing kidney function. According to the NKDEP, eGFR >89 is normal, 60-89 shows mild impairment, 30-59 shows moderate impairment, 15-29 shows severe impairment and <15 is ESRD.      HbA1c is better! Normal kidney fxn and potassium.

## 2014-09-13 NOTE — Patient Instructions (Signed)
Please increase: - Lantus to 42 units at bedtime  Please continue: - NovoLog: 27 units - with breakfast 27-35 units - with dinner - Invokana 100 mg in am  Please return in 3 months with your sugar log.   Please stop at the lab.

## 2014-10-10 ENCOUNTER — Other Ambulatory Visit: Payer: Self-pay | Admitting: Internal Medicine

## 2014-10-15 ENCOUNTER — Other Ambulatory Visit: Payer: Self-pay

## 2014-10-15 MED ORDER — GLUCOSE BLOOD VI STRP: ORAL_STRIP | Status: DC

## 2014-10-16 ENCOUNTER — Other Ambulatory Visit: Payer: Self-pay | Admitting: *Deleted

## 2014-10-16 MED ORDER — GLUCOSE BLOOD VI STRP: ORAL_STRIP | Status: DC

## 2014-11-07 ENCOUNTER — Ambulatory Visit (INDEPENDENT_AMBULATORY_CARE_PROVIDER_SITE_OTHER): Payer: 59 | Admitting: Internal Medicine

## 2014-11-07 ENCOUNTER — Encounter: Payer: Self-pay | Admitting: Internal Medicine

## 2014-11-07 VITALS — BP 132/86 | HR 72 | Temp 98.3°F | Wt 235.0 lb

## 2014-11-07 DIAGNOSIS — E1165 Type 2 diabetes mellitus with hyperglycemia: Secondary | ICD-10-CM

## 2014-11-07 DIAGNOSIS — IMO0001 Reserved for inherently not codable concepts without codable children: Secondary | ICD-10-CM

## 2014-11-07 DIAGNOSIS — E039 Hypothyroidism, unspecified: Secondary | ICD-10-CM

## 2014-11-07 NOTE — Patient Instructions (Addendum)
Please continue: - Lantus to 42 units at bedtime  Please increase: - NovoLog: 26 >> 30-32 units - with breakfast 30 >> 34-36 units - with dinner  Add Januvia 100 in am.  Please return in 3 months with your sugar log.   Please look into the [facility] Weight loss program.

## 2014-11-07 NOTE — Progress Notes (Signed)
Patient ID: Katie Cook, female   DOB: [birth year], 59 y.o.   MRN: [id number]  HPI: Katie Cook is a 59 y.o.-year-old female, returning for f/u for DM2, insulin-dependent, uncontrolled, without complications and postsurgical hypothyroidism (after she had compression sx with MNG - noncancerous). Last visit 2 mo ago.  Hypothyroidism: Reviewed hx: - she had MNG >> developed compression sxs - thyroidectomy in 2011-2012 - hypothyroidism >> Levothyroxine, then Armour 6 x later: 180 mg 4/7 days and 120 in 3/7 days >> b/c insurance coverage >> switched to Levothyroxine 200 mcg daily >> decreased gradually to 137 mcg.  She takes the LT4 137 g daily: - in am - fasting - eats b'fast >30 min later: yoghurt - she takes Omeprazole prn later in the day - takes MVI in pm or evening  Last TSH:  Lab Results  Component Value Date   TSH 0.71 06/10/2014   TSH 18.030* 03/22/2014   TSH 8.94* 02/11/2014   TSH 0.21* 11/09/2013   TSH 0.07* 10/08/2013   FREET4 1.22 06/10/2014   FREET4 1.19 03/22/2014   FREET4 1.02 02/11/2014   FREET4 1.24 11/09/2013   FREET4 1.38 10/08/2013  04/2013: TSH 0.4 (at the [facility])  DM2: Patient has been diagnosed with GDM during pregnancy, then DM2 in 2005; she started insulin in 2008.   Last hemoglobin A1c was:  Lab Results  Component Value Date   HGBA1C 7.8* 09/13/2014   HGBA1C 8.4* 06/10/2014   HGBA1C 8.1* 02/11/2014  ~8% 04/2013.  Pt is on a regimen of: - Lantus 38 >> 42 units qhs - NovoLog: 26 units - b'fast  30 units - dinner She was Invokana 100 mg - added 01/2014 >> initially stopped for diarrhea >> restarted 06/2014 >> but N/D >> now stopped   She stopped  Metformin XR 500 mg at lunchtime and 1000 mg at dinner - started 09/2013 >> nausea, diarrhea. She stopped Victoza >> nausea, 3 years. She tried Actos. She tried regular Metformin >> nausea. She tried Prandin. She tried Januvia.  Pt checks her sugars 2-3x a day and they are higher: - am:  76-145 >> 112-120 mostly >> 55-110 >> 70-120, 137 >> 77, 90-120 >> 68, 122-260 >> 100-257 - 2h after b'fast: 90, 192, 250 >> 120-140 >> 74, 207 >> 141-157, 197 >> 200 >> 111-228 >> 161-261 - before lunch: 71-199 >> 120-140 >> 77, 169, 269 >> 164-182, 251 >> 200-248 >> 104 >> 154, 166 - 2h after lunch: 81-210 >> n/c >> 201-210 >> 77, 125-190 >> 108-147, 166, 300 >> 97, 106 >> 104-255 - before dinner: 71-190 >> 90-100 >> 102-130, 260 >> 124-161 >> 69x1, 192-232 >> 181-210 >> 104-140 - 2h after dinner: 65-251 >> 220 >> 122, 177-260, 395 >> 158, 171-250 >> 190-260 >> see above >> 191-306 - bedtime: 96-205 >> 189-190 >> 130, 200-275 >> 125-195 >> 125-190 >> see above >> 174-314 - nighttime: n/c >> 180, 194 >> 113, 295 >> 3 am: 55 x1 >> 67, 262 >> n/c >> 228, 250 >> 84-300 Has lows. Lowest sugar was 84 x1; she has hypoglycemia awareness at 70.  Highest sugar was 306 x1.   Meter: AccuChek Aviva.  Pt's meals are: - Breakfast: yoghurt + fruit - Lunch: salad with [place] - Dinner: chicken, broccoli, meat - Snacks: 2-3: fruit, popcorn  She started to swim x1 h daily.   Reviewed latest labs - records from PCP: - no CKD, last BUN/creatinine:  03/19/2014: 17/0.75 Lab Results  Component Value Date  BUN 19 09/13/2014   CREATININE 0.47* 09/13/2014  On Losartan. - Has HL.  03/19/2014: 183/138/52/103 - last eye exam was in 01/2014. No DR.  - no numbness and tingling in her feet.  I reviewed pt's medications, allergies, PMH, social hx, family hx, and changes were documented in the history of present illness. Otherwise, unchanged from my initial visit note.  ROS: Constitutional: no weight gain, no fatigue, + hot flushes, + poor sleep, no nocturia Eyes: no blurry vision, no xerophthalmia ENT: no sore throat, no nodules palpated in throat, no dysphagia/odynophagia, + hoarseness, + tinnitus, + hypoacusis Cardiovascular: no CP/SOB/no palpitations/no leg swelling Respiratory: no cough/+  SOB Gastrointestinal: + N/no V/+ D/+ C/no heartburn Musculoskeletal: + muscle aches/+ joint aches Skin: + rash, + itching Neurological: no tremors/numbness/tingling/dizziness, + HA  PE: BP 132/86 mmHg  Pulse 72  Temp(Src) 98.3 F (36.8 C) (Oral)  Wt 235 lb (106.595 kg)  SpO2 96% Body mass index is 44.43 kg/(m^2).  Wt Readings from Last 3 Encounters:  11/07/14 235 lb (106.595 kg)  09/13/14 233 lb 12.8 oz (106.051 kg)  06/10/14 233 lb (105.688 kg)   Constitutional: overweight, in NAD Eyes: PERRLA, EOMI, no exophthalmos ENT: moist mucous membranes, no thyromegaly, no cervical lymphadenopathy Cardiovascular: RRR, No MRG Respiratory: CTA B Gastrointestinal: abdomen soft, NT, ND, BS+ Musculoskeletal: no deformities, strength intact in all 4 Skin: moist, warm, no rashes Neurological: no tremor with outstretched hands, DTR normal in all 4  ASSESSMENT: 1. DM2, insulin-dependent, uncontrolled, without complications  2. Hypothyroidism - postsurgical - thyroidectomy 2011 - h/o benign MNG  PLAN:  1. Patient with long-standing, uncontrolled diabetes, on basal-bolus insulin regimen, now off Invokana >> sugars higher, especially after dinner, which leads to higher sugars in a.m. Occasionally, patient is trying to correct the high sugars at night and in that case she is dropping her sugars overnight. We will go ahead and increase her mealtime insulin with dinner but also with breakfast since sugars after breakfast can also be elevated. - I advised her to:  Patient Instructions  Please continue: - Lantus 42 units at bedtime  Please increase: - NovoLog: 26 >> 30-32 units - with breakfast 30 >> 34-36 units - with dinner  Add Januvia 100 in am.  Please return in 3 months with your sugar log.   Please look into the [facility] Weight loss program.  - continue checking sugars at different times of the day - check 3 times a day, rotating checks - up to date with eye exams - check  Hba1c at next visit - She continues to gain weight >> advised her to look into the weight loss program from [facility]  - Return to clinic in 3 mo with sugar log   2. Hypothyroidism - pt is taking the LT4 correctly: every day, with water, >30 minutes before breakfast, separated by >4 hours from acid reflux medications, calcium, iron, multivitamins. - on Synthroid 137 mcg daily - Reviewed TFTs from 05/2014 >> normal - Will recheck thyroid tests at next visit

## 2014-11-08 ENCOUNTER — Telehealth: Payer: Self-pay | Admitting: Internal Medicine

## 2014-11-08 NOTE — Telephone Encounter (Signed)
Patient need refill for jaunivia send to [facility] [id number] - [place], [place] - [address] AT [facility] OF [address] & [facility] [address] [phone] (Phone) [phone] (Fax)

## 2014-11-11 ENCOUNTER — Other Ambulatory Visit: Payer: Self-pay | Admitting: Internal Medicine

## 2014-11-11 MED ORDER — SITAGLIPTIN PHOSPHATE 100 MG PO TABS: 100.0000 mg | ORAL_TABLET | Freq: Every day | ORAL | Status: DC

## 2014-11-29 ENCOUNTER — Other Ambulatory Visit: Payer: Self-pay | Admitting: Internal Medicine

## 2014-12-04 ENCOUNTER — Other Ambulatory Visit: Payer: Self-pay | Admitting: Internal Medicine

## 2014-12-04 MED ORDER — CANAGLIFLOZIN 100 MG PO TABS: 100.0000 mg | ORAL_TABLET | Freq: Every day | ORAL | Status: DC

## 2014-12-04 NOTE — Addendum Note (Signed)
Addended by: Adline Mango B on: 12/04/2014 03:19 PM   Modules accepted: Orders

## 2014-12-04 NOTE — Telephone Encounter (Signed)
Pt needs refill on pen needles

## 2014-12-13 ENCOUNTER — Telehealth: Payer: Self-pay | Admitting: *Deleted

## 2014-12-13 MED ORDER — INSULIN PEN NEEDLE 32G X 4 MM MISC: Status: DC

## 2014-12-13 NOTE — Telephone Encounter (Signed)
Called pt and lvm advising her per Dr Gherghe's message below.  

## 2014-12-13 NOTE — Telephone Encounter (Signed)
Called pt and advised her per Dr Charlean Sanfilippo message. Pt voiced understanding ... She said that the Januvia is $50 a month and she has not gotten it yet. Advised pt we will see if we have a copay card to help her. Be advised.

## 2014-12-13 NOTE — Telephone Encounter (Signed)
Can try 1/2 a tablet for 2-3 days, but if diarrhea not better, let's stop and see if the addition of Januvia will help.

## 2014-12-13 NOTE — Telephone Encounter (Signed)
I printed a coupon for her >> placed on your desk, but she can also print it online.

## 2014-12-13 NOTE — Telephone Encounter (Signed)
Pt called stating that around 7 days after starting the Invokana, she began to have diarrhea. She stopped it for a few days and the diarrhea stopped. Pt restarted the med and the diarrhea has begun again. Pt wants to know what Dr Elvera Lennox thinks she should do. Please advise.

## 2014-12-19 ENCOUNTER — Telehealth: Payer: Self-pay | Admitting: Internal Medicine

## 2014-12-19 LAB — HM DIABETES EYE EXAM

## 2014-12-19 NOTE — Telephone Encounter (Signed)
She has an appt tomorrow - will discuss then.

## 2014-12-19 NOTE — Telephone Encounter (Signed)
Please read message below and advise.  

## 2014-12-19 NOTE — Telephone Encounter (Signed)
Pt called said a coupon was given and its no good because she has insurance and now she taking the other medication she was told to take and its giving her a yeast infection , please call pt [phone]

## 2014-12-20 ENCOUNTER — Encounter: Payer: Self-pay | Admitting: Internal Medicine

## 2014-12-20 ENCOUNTER — Other Ambulatory Visit: Payer: Self-pay | Admitting: *Deleted

## 2014-12-20 ENCOUNTER — Other Ambulatory Visit: Payer: 59

## 2014-12-20 ENCOUNTER — Ambulatory Visit (INDEPENDENT_AMBULATORY_CARE_PROVIDER_SITE_OTHER): Payer: 59 | Admitting: Internal Medicine

## 2014-12-20 VITALS — BP 122/64 | HR 73 | Temp 98.0°F | Resp 12 | Wt 234.8 lb

## 2014-12-20 DIAGNOSIS — IMO0001 Reserved for inherently not codable concepts without codable children: Secondary | ICD-10-CM

## 2014-12-20 DIAGNOSIS — E1165 Type 2 diabetes mellitus with hyperglycemia: Secondary | ICD-10-CM

## 2014-12-20 DIAGNOSIS — E039 Hypothyroidism, unspecified: Secondary | ICD-10-CM

## 2014-12-20 DIAGNOSIS — E669 Obesity, unspecified: Secondary | ICD-10-CM | POA: Diagnosis not present

## 2014-12-20 MED ORDER — LINAGLIPTIN 5 MG PO TABS: 5.0000 mg | ORAL_TABLET | Freq: Every day | ORAL | Status: DC

## 2014-12-20 NOTE — Patient Instructions (Addendum)
Please continue: - Lantus 42 units at bedtime - NovoLog: 26 - 30 units - with breakfast 30 - 35 units - with dinner  Add Tradjenta 5 in am.  Please return in 3 months with your sugar log.   Please stop at the lab at [facility].

## 2014-12-20 NOTE — Progress Notes (Signed)
Patient ID: Katie Cook, female   DOB: 1955-07-21, 59 y.o.   MRN: [id number]  HPI: Katie Cook is a 59 y.o.-year-old female, returning for f/u for DM2, insulin-dependent, uncontrolled, without complications and postsurgical hypothyroidism (after she had compression sx with MNG - noncancerous). Last visit 1.5 mo ago.  Hypothyroidism: Reviewed hx: - she had MNG >> developed compression sxs - thyroidectomy in 2011-2012 - hypothyroidism >> Levothyroxine, then Armour 6 x later: 180 mg 4/7 days and 120 in 3/7 days >> b/c insurance coverage >> switched to Levothyroxine 200 mcg daily >> decreased gradually to 137 mcg.  She takes the LT4 137 g daily: - in am - fasting - eats b'fast >30 min later: yoghurt - she takes Omeprazole prn later in the day - takes MVI in pm or evening  Last TSH:  Lab Results  Component Value Date   TSH 0.71 06/10/2014   TSH 18.030* 03/22/2014   TSH 8.94* 02/11/2014   TSH 0.21* 11/09/2013   TSH 0.07* 10/08/2013   FREET4 1.22 06/10/2014   FREET4 1.19 03/22/2014   FREET4 1.02 02/11/2014   FREET4 1.24 11/09/2013   FREET4 1.38 10/08/2013  04/2013: TSH 0.4 (at the [facility])  DM2: Patient has been diagnosed with GDM during pregnancy, then DM2 in 2005; she started insulin in 2008.   Last hemoglobin A1c was:  Lab Results  Component Value Date   HGBA1C 7.8* 09/13/2014   HGBA1C 8.4* 06/10/2014   HGBA1C 8.1* 02/11/2014  ~8% 04/2013.  Pt is on a regimen of: - Lantus 38 >> 42 units qhs - NovoLog: 26 - 30 units - with breakfast 30 - 35 units - with dinner - Januvia 100 mg daily - cannot afford She was Invokana 100 mg - added 01/2014 >> initially stopped for diarrhea >> restarted 06/2014 >> but N/D >> restarted 10/2014 >> yeast inf >> stopped  She stopped  Metformin XR 500 mg at lunchtime and 1000 mg at dinner - started 09/2013 >> nausea, diarrhea. She stopped Victoza >> nausea, 3 years. She tried Actos. She tried regular Metformin >> nausea. She  tried Prandin. She tried Januvia.  Pt checks her sugars 2-3x a day and they are better: - am: 112-120 mostly >> 55-110 >> 70-120, 137 >> 77, 90-120 >> 68, 122-260 >> 100-257 >> 116-171 - 2h after b'fast: 120-140 >> 74, 207 >> 141-157, 197 >> 200 >> 111-228 >> 161-261 >> 232x1 - before lunch: 120-140 >> 77, 169, 269 >> 164-182, 251 >> 200-248 >> 104 >> 154, 166 >> 133, 136 - 2h after lunch: 201-210 >> 77, 125-190 >> 108-147, 166, 300 >> 97, 106 >> 104-255 >> 103 - before dinner: 90-100 >> 102-130, 260 >> 124-161 >> 69x1, 192-232 >> 181-210 >> 104-140 >> 138, 198 - 2h after dinner:122, 177-260, 395 >> 158, 171-250 >> 190-260 >> see above >> 191-306 >> 329x1 - bedtime: 96-205 >> 189-190 >> 130, 200-275 >> 125-195 >> 125-190 >> see above >> 174-314 >> 142, 143 - nighttime: n/c >> 180, 194 >> 113, 295 >> 3 am: 55 x1 >> 67, 262 >> n/c >> 228, 250 >> 84-300 >> 157-204 Has lows. Lowest sugar was 84 x1 >> 103; she has hypoglycemia awareness at 70.  Highest sugar was 300s  Meter: AccuChek Aviva.  Pt's meals are: - Breakfast: yoghurt + fruit - Lunch: salad with [place] - Dinner: chicken, broccoli, meat - Snacks: 2-3: fruit, popcorn  She tells me that if she doesn't eat her sugars are higher.  Reviewed latest labs - records from PCP: - no CKD, last BUN/creatinine:   Lab Results  Component Value Date   BUN 19 09/13/2014   CREATININE 0.47* 09/13/2014  03/19/2014: 17/0.75 On Losartan. - Has HL.  03/19/2014: 183/138/52/103 - last eye exam was in 01/2014. No DR.  - no numbness and tingling in her feet.  I reviewed pt's medications, allergies, PMH, social hx, family hx, and changes were documented in the history of present illness. Otherwise, unchanged from my initial visit note.  ROS: Constitutional: no weight gain, no fatigue, + hot flushes, + poor sleep, no nocturia, + problems with concentration Eyes: no blurry vision, no xerophthalmia ENT: + sore throat, no nodules palpated in throat,  no dysphagia/odynophagia Cardiovascular: no CP/SOB/no palpitations/+ leg swelling Respiratory: + cough/no SOB Gastrointestinal: + N/no V/+ D/+ C/no heartburn Musculoskeletal: + muscle aches/no  joint aches Skin: no rash, + itching, + hair loss  Neurological: no tremors/numbness/tingling/dizziness, + HA  PE: BP 122/64 mmHg  Pulse 73  Temp(Src) 98 F (36.7 C) (Oral)  Resp 12  Wt 234 lb 12.8 oz (106.505 kg)  SpO2 98% Body mass index is 44.39 kg/(m^2).  Wt Readings from Last 3 Encounters:  12/20/14 234 lb 12.8 oz (106.505 kg)  11/07/14 235 lb (106.595 kg)  09/13/14 233 lb 12.8 oz (106.051 kg)   Constitutional: overweight, in NAD Eyes: PERRLA, EOMI, no exophthalmos ENT: moist mucous membranes, no thyromegaly, no cervical lymphadenopathy Cardiovascular: RRR, No MRG Respiratory: CTA B Gastrointestinal: abdomen soft, NT, ND, BS+ Musculoskeletal: no deformities, strength intact in all 4 Skin: moist, warm, no rashes Neurological: no tremor with outstretched hands, DTR normal in all 4  ASSESSMENT: 1. DM2, insulin-dependent, uncontrolled, without complications  2. Hypothyroidism - postsurgical - thyroidectomy 2011 - h/o benign MNG  3. Obesity  PLAN:  1. Patient with long-standing, uncontrolled diabetes, on basal-bolus insulin regimen, now off Invokana >> will try to add Tradjenta since it is 70$ per 3 mo per list brought by pt. - I advised her to:  Patient Instructions  Please continue: - Lantus 42 units at bedtime - NovoLog: 26 - 30 units - with breakfast 30 - 35 units - with dinner  Add Tradjenta 5 in am.  Please return in 3 months with your sugar log.   Please stop at the lab at [facility].  - continue checking sugars at different times of the day - check 3 times a day, rotating checks - up to date with eye exams - check Hba1c today - Return to clinic in 3 mo with sugar log   2. Hypothyroidism - pt is taking the LT4 correctly: every day, with water, >30 minutes before  breakfast, separated by >4 hours from acid reflux medications, calcium, iron, multivitamins. - on Synthroid 137 mcg daily - Reviewed TFTs from 05/2014 >> normal - Will recheck thyroid tests today  3. Obesity - As at previous visits, most of the appointment is spent as patient describes how frustrated she is about her weight. She mentions that she barely eats. She also mentions that she was swimming over the summer but she did not lose weight. I explained that to perform enough exercise to lose a significant amount of weight is very difficult. She should be more careful with the diet. I explained that weight gain or loss comes form the difference between calories in/calories out. She did not look into the weight loss program from [facility]. She keeps waiting for either me or PCP to find out what hormonal  imbalance she has that does not allow her to lose weight. I explained that she has hypothyroidism, which however, is well controlled. She also has diabetes, and insulin will make it harder to lose weight, but not impossible. It is not impossible to lose weight even with these problems and she will need to try to start  taking responsibility and make changes in her diet and exercise until she gets results. No intervention will come from outside.  I again recommended the weight loss program from [facility]. She can also look up other diets, and not give up until she starts seeing results. It is difficult to advise her about the proper diet (which I tried to do) when she insists that she barely eats. She does accept referral to nutrition, which was made today.  Appointment on 12/20/2014  Component Date Value Ref Range Status  . Hgb A1c MFr Bld 12/20/2014 7.9* 4.8 - 5.6 % Final   Comment:          Pre-diabetes: 5.7 - 6.4          Diabetes: >6.4          Glycemic control for adults with diabetes: <7.0   . Est. average glucose Bld gHb Est-m* 12/20/2014 180   Final  . TSH 12/20/2014 5.380* 0.450 -  4.500 uIU/mL Final  . Free T4 12/20/2014 1.40  0.82 - 1.77 ng/dL Final  [id number] stable, but above goal. Tradjenta should help. TSH slightly high. TSH was normal on the same dose in the past. We'll encourage compliance and will recheck labs at next visit. If TSH still high, will increase the dose.

## 2014-12-21 LAB — HEMOGLOBIN A1C
Est. average glucose Bld gHb Est-mCnc: 180 mg/dL
Hgb A1c MFr Bld: 7.9 % — ABNORMAL HIGH (ref 4.8–5.6)

## 2014-12-21 LAB — TSH: TSH: 5.38 u[IU]/mL — ABNORMAL HIGH (ref 0.450–4.500)

## 2014-12-21 LAB — T4, FREE: Free T4: 1.4 ng/dL (ref 0.82–1.77)

## 2014-12-23 ENCOUNTER — Telehealth: Payer: Self-pay | Admitting: Internal Medicine

## 2014-12-23 NOTE — Telephone Encounter (Signed)
Patient is call for her lab results

## 2014-12-24 ENCOUNTER — Telehealth: Payer: Self-pay | Admitting: Internal Medicine

## 2014-12-24 NOTE — Telephone Encounter (Signed)
Montez Hageman called pt and advised her per Dr Charlean Sanfilippo result note.

## 2014-12-24 NOTE — Telephone Encounter (Signed)
OK to switch to NatureThroid 97.5 mg daily. Needs labs in 1.5 mo: TSH, fT4 and fT3.

## 2014-12-24 NOTE — Telephone Encounter (Signed)
Patient is returning your call, she ask if you would call before you leave today.

## 2014-12-24 NOTE — Telephone Encounter (Signed)
Pt would like to change to [facility] Thyroid. There was some confusion, she wants to change from the levothyroxine. [facility]by Dawes thyroid is affordable for her. And she would like for it to be sent to [facility].

## 2014-12-25 MED ORDER — THYROID 97.5 MG PO TABS: 97.5000 mg | ORAL_TABLET | ORAL | Status: DC

## 2014-12-25 NOTE — Telephone Encounter (Signed)
Called pt and lvm and advising her per Dr Charlean Sanfilippo message below.

## 2015-01-03 ENCOUNTER — Ambulatory Visit: Payer: 59 | Admitting: Internal Medicine

## 2015-01-10 ENCOUNTER — Ambulatory Visit: Payer: 59 | Admitting: Dietician

## 2015-01-17 ENCOUNTER — Ambulatory Visit: Payer: 59 | Admitting: Internal Medicine

## 2015-01-20 ENCOUNTER — Other Ambulatory Visit: Payer: Self-pay | Admitting: Allergy and Immunology

## 2015-01-20 ENCOUNTER — Telehealth: Payer: Self-pay | Admitting: Internal Medicine

## 2015-01-20 ENCOUNTER — Encounter: Payer: Self-pay | Admitting: Internal Medicine

## 2015-01-20 ENCOUNTER — Ambulatory Visit
Admission: RE | Admit: 2015-01-20 | Discharge: 2015-01-20 | Disposition: A | Discharge status: Home / Self Care | Payer: 59 | Source: Ambulatory Visit | Attending: Allergy and Immunology | Admitting: Allergy and Immunology

## 2015-01-20 DIAGNOSIS — R059 Cough, unspecified: Secondary | ICD-10-CM

## 2015-01-20 DIAGNOSIS — R05 Cough: Secondary | ICD-10-CM

## 2015-01-20 NOTE — Telephone Encounter (Signed)
Called pt and advised her that Dr Elvera Lennox is not in today. Pt voiced understanding, she stated that she is not feeling well and would like her thyroid checked since changing medications. Please advise.

## 2015-01-20 NOTE — Telephone Encounter (Signed)
Patient would like to know if she could come in and get her blood drawn tomorrow instead of in 6 weeks, because she is not feeling well, please advise

## 2015-01-21 ENCOUNTER — Other Ambulatory Visit: Payer: Self-pay | Admitting: *Deleted

## 2015-01-21 DIAGNOSIS — E039 Hypothyroidism, unspecified: Secondary | ICD-10-CM

## 2015-01-21 NOTE — Telephone Encounter (Signed)
Ok

## 2015-01-21 NOTE — Telephone Encounter (Signed)
Called pt and lvm advising her that Dr Elvera Lennox said it was ok to come in for thyroid labs.

## 2015-01-23 LAB — T4, FREE: Free T4: 0.93 ng/dL (ref 0.82–1.77)

## 2015-01-23 LAB — TSH: TSH: 7.07 u[IU]/mL — ABNORMAL HIGH (ref 0.450–4.500)

## 2015-01-24 ENCOUNTER — Other Ambulatory Visit: Payer: Self-pay | Admitting: *Deleted

## 2015-01-24 MED ORDER — THYROID 113.75 MG PO TABS: 1.0000 | ORAL_TABLET | ORAL | Status: DC

## 2015-03-03 ENCOUNTER — Telehealth: Payer: Self-pay | Admitting: Internal Medicine

## 2015-03-05 ENCOUNTER — Other Ambulatory Visit: Payer: Self-pay | Admitting: *Deleted

## 2015-03-05 ENCOUNTER — Other Ambulatory Visit (INDEPENDENT_AMBULATORY_CARE_PROVIDER_SITE_OTHER): Payer: 59

## 2015-03-05 DIAGNOSIS — E039 Hypothyroidism, unspecified: Secondary | ICD-10-CM | POA: Diagnosis not present

## 2015-03-06 ENCOUNTER — Other Ambulatory Visit: Payer: Self-pay | Admitting: Internal Medicine

## 2015-03-07 LAB — TSH: TSH: 16.65 u[IU]/mL — ABNORMAL HIGH (ref 0.35–4.50)

## 2015-03-07 LAB — T4, FREE: Free T4: 0.54 ng/dL — ABNORMAL LOW (ref 0.60–1.60)

## 2015-03-11 ENCOUNTER — Other Ambulatory Visit: Payer: Self-pay | Admitting: *Deleted

## 2015-03-11 MED ORDER — NATURE-THROID 113.75 MG PO TABS: 1.0000 | ORAL_TABLET | Freq: Every day | ORAL | Status: DC

## 2015-03-12 ENCOUNTER — Other Ambulatory Visit: Payer: Self-pay | Admitting: *Deleted

## 2015-03-12 MED ORDER — NATURE-THROID 130 MG PO TABS: 130.0000 mg | ORAL_TABLET | Freq: Every day | ORAL | Status: DC

## 2015-03-13 NOTE — Telephone Encounter (Signed)
error 

## 2015-03-21 ENCOUNTER — Ambulatory Visit: Payer: 59 | Admitting: Internal Medicine

## 2015-03-25 ENCOUNTER — Telehealth: Payer: Self-pay | Admitting: Internal Medicine

## 2015-03-25 MED ORDER — INSULIN GLARGINE 100 UNIT/ML ~~LOC~~ SOLN: 42.0000 [IU] | Freq: Every day | SUBCUTANEOUS | Status: DC

## 2015-03-25 NOTE — Telephone Encounter (Signed)
Refill sent to her pharmacy

## 2015-03-25 NOTE — Telephone Encounter (Signed)
She is asking if [facility]ant[facility] so she can use the copay cards. She does have enough to last her until tomorrow.

## 2015-03-26 ENCOUNTER — Telehealth: Payer: Self-pay | Admitting: Internal Medicine

## 2015-03-26 NOTE — Telephone Encounter (Signed)
Melissa called [facility] [place] [employer] stated they need her patient last labs and office notes, please advise

## 2015-03-26 NOTE — Telephone Encounter (Signed)
Faxed them.

## 2015-04-08 ENCOUNTER — Other Ambulatory Visit: Payer: 59

## 2015-04-09 ENCOUNTER — Encounter (HOSPITAL_COMMUNITY): Payer: Self-pay | Admitting: *Deleted

## 2015-04-09 ENCOUNTER — Emergency Department (HOSPITAL_COMMUNITY)
Admission: EM | Admit: 2015-04-09 | Discharge: 2015-04-09 | Disposition: A | Discharge status: Home / Self Care | Payer: 59 | Attending: Emergency Medicine | Admitting: Emergency Medicine

## 2015-04-09 DIAGNOSIS — E119 Type 2 diabetes mellitus without complications: Secondary | ICD-10-CM | POA: Diagnosis not present

## 2015-04-09 DIAGNOSIS — I10 Essential (primary) hypertension: Secondary | ICD-10-CM | POA: Diagnosis not present

## 2015-04-09 DIAGNOSIS — Z79899 Other long term (current) drug therapy: Secondary | ICD-10-CM | POA: Diagnosis not present

## 2015-04-09 DIAGNOSIS — M546 Pain in thoracic spine: Secondary | ICD-10-CM | POA: Insufficient documentation

## 2015-04-09 DIAGNOSIS — R197 Diarrhea, unspecified: Secondary | ICD-10-CM | POA: Diagnosis not present

## 2015-04-09 DIAGNOSIS — R1013 Epigastric pain: Secondary | ICD-10-CM | POA: Insufficient documentation

## 2015-04-09 DIAGNOSIS — R110 Nausea: Secondary | ICD-10-CM | POA: Diagnosis not present

## 2015-04-09 DIAGNOSIS — Z794 Long term (current) use of insulin: Secondary | ICD-10-CM | POA: Insufficient documentation

## 2015-04-09 DIAGNOSIS — Z7951 Long term (current) use of inhaled steroids: Secondary | ICD-10-CM | POA: Insufficient documentation

## 2015-04-09 DIAGNOSIS — Z7952 Long term (current) use of systemic steroids: Secondary | ICD-10-CM | POA: Diagnosis not present

## 2015-04-09 DIAGNOSIS — E079 Disorder of thyroid, unspecified: Secondary | ICD-10-CM | POA: Diagnosis not present

## 2015-04-09 LAB — CBC WITH DIFFERENTIAL/PLATELET
Basophils Absolute: 0 10*3/uL (ref 0.0–0.1)
Basophils Relative: 0 %
Eosinophils Absolute: 0.1 10*3/uL (ref 0.0–0.7)
Eosinophils Relative: 1 %
HCT: 39.1 % (ref 36.0–46.0)
Hemoglobin: 12.6 g/dL (ref 12.0–15.0)
Lymphocytes Relative: 33 %
Lymphs Abs: 2.8 10*3/uL (ref 0.7–4.0)
MCH: 26.5 pg (ref 26.0–34.0)
MCHC: 32.2 g/dL (ref 30.0–36.0)
MCV: 82.3 fL (ref 78.0–100.0)
Monocytes Absolute: 0.5 10*3/uL (ref 0.1–1.0)
Monocytes Relative: 6 %
Neutro Abs: 5.1 10*3/uL (ref 1.7–7.7)
Neutrophils Relative %: 60 %
Platelets: 237 10*3/uL (ref 150–400)
RBC: 4.75 MIL/uL (ref 3.87–5.11)
RDW: 14.4 % (ref 11.5–15.5)
WBC: 8.6 10*3/uL (ref 4.0–10.5)

## 2015-04-09 LAB — URINALYSIS, ROUTINE W REFLEX MICROSCOPIC
Bilirubin Urine: NEGATIVE
Glucose, UA: 100 mg/dL — AB
Hgb urine dipstick: NEGATIVE
Ketones, ur: NEGATIVE mg/dL
Leukocytes, UA: NEGATIVE
Nitrite: NEGATIVE
Protein, ur: NEGATIVE mg/dL
Specific Gravity, Urine: 1.022 (ref 1.005–1.030)
pH: 6 (ref 5.0–8.0)

## 2015-04-09 LAB — COMPREHENSIVE METABOLIC PANEL
ALT: 18 U/L (ref 14–54)
AST: 20 U/L (ref 15–41)
Albumin: 3.6 g/dL (ref 3.5–5.0)
Alkaline Phosphatase: 80 U/L (ref 38–126)
Anion gap: 10 (ref 5–15)
BUN: 16 mg/dL (ref 6–20)
CO2: 24 mmol/L (ref 22–32)
Calcium: 9.1 mg/dL (ref 8.9–10.3)
Chloride: 105 mmol/L (ref 101–111)
Creatinine, Ser: 0.57 mg/dL (ref 0.44–1.00)
GFR calc Af Amer: >60 mL/min (ref >60)
GFR calc non Af Amer: >60 mL/min (ref >60)
Glucose, Bld: 227 mg/dL — ABNORMAL HIGH (ref 65–99)
Potassium: 4.1 mmol/L (ref 3.5–5.1)
Sodium: 139 mmol/L (ref 135–145)
Total Bilirubin: 0.3 mg/dL (ref 0.3–1.2)
Total Protein: 6.2 g/dL — ABNORMAL LOW (ref 6.5–8.1)

## 2015-04-09 LAB — LIPASE, BLOOD: Lipase: 18 U/L (ref 11–51)

## 2015-04-09 MED ORDER — NAPROXEN 500 MG PO TABS
500.0000 mg | ORAL_TABLET | Freq: Two times a day (BID) | ORAL | Status: DC
Start: 2015-04-09 — End: 2015-05-02

## 2015-04-09 MED ORDER — CYCLOBENZAPRINE HCL 10 MG PO TABS: 10.0000 mg | ORAL_TABLET | Freq: Two times a day (BID) | ORAL | Status: DC | PRN

## 2015-04-09 MED ORDER — IBUPROFEN 800 MG PO TABS
800.0000 mg | ORAL_TABLET | Freq: Once | ORAL | Status: AC
  Administered 2015-04-09: 800 mg via ORAL
  Filled 2015-04-09: qty 1

## 2015-04-09 NOTE — Discharge Instructions (Signed)
Take your medications as prescribed as needed for pain relief. I also recommend applying ice for 15-20 minutes to affected areas 3-4 times daily as needed. Refrain from doing any heavy lifting/bending for the next couple days until your pain has improved. Follow-up with your primary care provider in 3-4 days. Return to emergency department if symptoms worsen or new onset of fever, numbness, tingling, groin anesthesia, loss of bowel or bladder, weakness, abdominal pain, vomiting, blood in stool or emesis.

## 2015-04-09 NOTE — ED Provider Notes (Signed)
CSN: [id number]     Arrival date & time 04/09/15  0818 History   First MD Initiated Contact with Patient 04/09/15 1003     Chief Complaint  Patient presents with  . Back Pain     (Consider location/radiation/quality/duration/timing/severity/associated sxs/prior Treatment) HPI   It is a 59 year old female with past medical history of hypertension diabetes who presents to the ED with complaint of back pain. Patient reports having intermittent back pain for the past few months. She states over the past few days her back pain has worsened. She reports having constant aching pain to her right mid back with intermittent sharp pain that radiates laterally across to her ribs and upper abdomen. She also notes she has had associated nausea and reports one episode of nonbloody diarrhea this morning. Pt denies fever, numbness, tingling, saddle anesthesia, loss of bowel or bladder, vomiting, weakness, IVDU, cancer or recent spinal manipulation. She notes she has been taking NSAIDs at home with mild relief. Denies any recent fall, trauma, injury.  Past Medical History  Diagnosis Date  . Hypertension   . Diabetes mellitus without complication (HCC)   . Thyroid disease   . Uncontrolled diabetes mellitus type 2 without complications (HCC) 07/26/2013   Past Surgical History  Procedure Laterality Date  . Cesarean section    . Thyroid surgery     Family History  Problem Relation Age of Onset  . Ovarian cancer Mother   . Cancer Mother   . Cancer Father     pancreatic   . Stroke Father   . Diabetes Sister   . Cancer Maternal Aunt     breast cancer   . Stroke Paternal Uncle   . Diabetes Maternal Grandmother   . Cancer Maternal Aunt     thyroid   . Hypertension Sister    Social History  Substance Use Topics  . Smoking status: Never Smoker   . Smokeless tobacco: None  . Alcohol Use: No   OB History    No data available     Review of Systems  Gastrointestinal: Positive for nausea,  abdominal pain and diarrhea.  Musculoskeletal: Positive for back pain.  All other systems reviewed and are negative.     Allergies  Ceclor and Sulfa antibiotics  Home Medications   Prior to Admission medications   Medication Sig Start Date End Date Taking? Authorizing Provider  beclomethasone (QVAR) 40 MCG/ACT inhaler Inhale 2 puffs into the lungs 2 (two) times daily.   Yes Historical Provider, MD  carvedilol (COREG) 12.5 MG tablet Take 12.5 mg by mouth daily.   Yes Historical Provider, MD  cetirizine (ZYRTEC) 10 MG tablet Take 10 mg by mouth daily.   Yes Historical Provider, MD  glucose blood (ONE TOUCH ULTRA TEST) test strip Use to check blood sugar 3 times per day. 08/19/14  Yes Carlus Pavlov, MD  insulin aspart (NOVOLOG) 100 UNIT/ML injection Inject 20-30 Units into the skin 2 (two) times daily. Patient taking differently: Inject 30 Units into the skin 2 (two) times daily.  10/08/13  Yes Carlus Pavlov, MD  insulin glargine (LANTUS) 100 UNIT/ML injection Inject 0.42 mLs (42 Units total) into the skin at bedtime. 03/25/15  Yes Carlus Pavlov, MD  Insulin Pen Needle (NOVOFINE PLUS) 32G X 4 MM MISC Use to inject insulin 4 times daily. 12/13/14  Yes Carlus Pavlov, MD  Lancets [facility] ULTRASOFT) lancets Use to test blood sugar 3 times daily as instructed. Dx: E11.65 08/19/14  Yes Carlus Pavlov, MD  linagliptin (TRADJENTA)  5 MG TABS tablet Take 1 tablet (5 mg total) by mouth daily. 12/20/14  Yes Carlus Pavlov, MD  losartan-hydrochlorothiazide (HYZAAR) 100-25 MG per tablet Take 1 tablet by mouth daily.   Yes Historical Provider, MD  NATURE-THROID 130 MG tablet Take 1 tablet (130 mg total) by mouth daily. 03/12/15  Yes Carlus Pavlov, MD  omeprazole (PRILOSEC) 20 MG capsule Take 20 mg by mouth 2 (two) times daily.   Yes Historical Provider, MD  [facility] VERIO test strip Use to test blood sugar 3  to 4 times daily as  instructed. 03/10/15  Yes Carlus Pavlov, MD  prednisoLONE  acetate (PRED FORTE) 1 % ophthalmic suspension Place 1 drop into both eyes 2 (two) times daily.  10/04/13  Yes Historical Provider, MD  QC PEN NEEDLES 31G X 6 MM MISC  07/28/13  Yes Historical Provider, MD  cyclobenzaprine (FLEXERIL) 10 MG tablet Take 1 tablet (10 mg total) by mouth 2 (two) times daily as needed for muscle spasms. 04/09/15   Satira Sark Jye Fariss, PA-C  JANUVIA 100 MG tablet TAKE 1 TABLET(100 MG) BY MOUTH DAILY Patient not taking: Reported on 12/20/2014 11/11/14   Carlus Pavlov, MD  metFORMIN (GLUCOPHAGE-XR) 500 MG 24 hr tablet Take 2 tablets (1,000 mg total) by mouth 2 (two) times daily with a meal. 07/26/13   Carlus Pavlov, MD  naproxen (NAPROSYN) 500 MG tablet Take 1 tablet (500 mg total) by mouth 2 (two) times daily. 04/09/15   Satira Sark Dynastie Knoop, PA-C   BP 153/62 mmHg  Pulse 67  Temp(Src) 97.8 F (36.6 C) (Oral)  Resp 16  SpO2 98% Physical Exam  Constitutional: She is oriented to person, place, and time. She appears well-developed and well-nourished.  HENT:  Head: Normocephalic and atraumatic.  Mouth/Throat: Oropharynx is clear and moist. No oropharyngeal exudate.  Eyes: Conjunctivae and EOM are normal. Pupils are equal, round, and reactive to light. Right eye exhibits no discharge. Left eye exhibits no discharge. No scleral icterus.  Neck: Normal range of motion. Neck supple.  Cardiovascular: Normal rate, regular rhythm, normal heart sounds and intact distal pulses.   Pulmonary/Chest: Effort normal and breath sounds normal. No respiratory distress. She has no wheezes. She has no rales. She exhibits no tenderness.  Abdominal: Soft. Bowel sounds are normal. She exhibits no distension and no mass. There is tenderness (epigastric). There is no rigidity, no rebound, no guarding and no CVA tenderness.  Musculoskeletal: Normal range of motion. She exhibits no edema.  No C/T/L midline tenderness. Mild TTP of right mid-thoracic paraspinal muscles. FROM of neck and back.  FROM of BUE and BLE. 5/5 strength BUE and BLE. 2+ PT pulses. Sensation intact   Lymphadenopathy:    She has no cervical adenopathy.  Neurological: She is alert and oriented to person, place, and time. She has normal strength and normal reflexes. No sensory deficit. Coordination and gait normal.  Skin: Skin is warm and dry.  Nursing note and vitals reviewed.   ED Course  Procedures (including critical care time) Labs Review Labs Reviewed  URINALYSIS, ROUTINE W REFLEX MICROSCOPIC (NOT AT [facility]) - Abnormal; Notable for the following:    Glucose, UA 100 (*)    All other components within normal limits  COMPREHENSIVE METABOLIC PANEL - Abnormal; Notable for the following:    Glucose, Bld 227 (*)    Total Protein 6.2 (*)    All other components within normal limits  CBC WITH DIFFERENTIAL/PLATELET  LIPASE, BLOOD    Imaging Review No results found. I  have personally reviewed and evaluated these images and lab results as part of my medical decision-making.    MDM   Final diagnoses:  Right-sided thoracic back pain    Patient presents with worsening back pain that she has had for the past few months. Denies any recent fall, trauma, injury. Denies fever. No back pain red flags. VSS. Exam revealed mild tenderness to right midthoracic paraspinal muscles in mild tenderness to palpation noted to epigastric region on abdominal exam, no peritoneal signs. No neuro deficits. Labs and urine unremarkable. I suspect patient's pain is likely due to musculoskeletal etiology, possible muscles strain/spasm. She reports her pain has improved after taking NSAIDs in the ED. Plan to discharge patient home with NSAIDs and muscle relaxant.  Evaluation does not show pathology requring ongoing emergent intervention or admission. Pt is hemodynamically stable and mentating appropriately. Discussed findings/results and plan with patient/guardian, who agrees with plan. All questions answered. Return precautions  discussed and outpatient follow up given.      Satira Sarkicole Elizabeth LyleNadeau, [place] JerseyPA-C 04/09/15 1305  Bethann BerkshireJoseph Zammit, MD 04/10/15 1030

## 2015-04-09 NOTE — ED Notes (Signed)
Pt is in stable condition upon d/c and ambulates from ED. 

## 2015-04-09 NOTE — ED Notes (Addendum)
Pt reports intermittent right sided back pain for several weeks. Pt states that the pain is sharp. Pt denies bowel or bladder changes. Pain is worse with movement. Pt also reports diarrhea.

## 2015-04-17 ENCOUNTER — Telehealth: Payer: Self-pay | Admitting: Internal Medicine

## 2015-04-17 ENCOUNTER — Other Ambulatory Visit: Payer: Self-pay | Admitting: Family Medicine

## 2015-04-17 DIAGNOSIS — R109 Unspecified abdominal pain: Secondary | ICD-10-CM

## 2015-04-17 NOTE — Telephone Encounter (Signed)
Reviewed pt's chart and office notes. Pt is not on this medication. Is this the correct patient? Please advise.

## 2015-04-17 NOTE — Telephone Encounter (Signed)
Patient ask if she can get the generic for repaglinide (prandin) sent to her pharmacy  [facility] [place] [id number] [phone] (Phone) [phone] (Fax)

## 2015-04-18 NOTE — Telephone Encounter (Signed)
Pt was told by her pharmacy that we called this in for her but it needs to be a 90 day supply. i informed the pt that we do not see it on her med list.

## 2015-04-18 NOTE — Telephone Encounter (Signed)
Spoke with pt. She will contact pharmacy to see what this issue is.

## 2015-04-18 NOTE — Telephone Encounter (Signed)
Yes, she is asking to be put on this medication, I didn't see it in her medication list either.

## 2015-04-24 ENCOUNTER — Ambulatory Visit
Admission: RE | Admit: 2015-04-24 | Discharge: 2015-04-24 | Disposition: A | Discharge status: Home / Self Care | Payer: 59 | Source: Ambulatory Visit | Attending: Family Medicine | Admitting: Family Medicine

## 2015-04-24 DIAGNOSIS — R109 Unspecified abdominal pain: Secondary | ICD-10-CM

## 2015-05-02 ENCOUNTER — Ambulatory Visit (INDEPENDENT_AMBULATORY_CARE_PROVIDER_SITE_OTHER): Payer: 59 | Admitting: Internal Medicine

## 2015-05-02 ENCOUNTER — Encounter: Payer: Self-pay | Admitting: Internal Medicine

## 2015-05-02 VITALS — BP 128/74 | HR 70 | Temp 98.3°F | Resp 12 | Wt 233.0 lb

## 2015-05-02 DIAGNOSIS — Z794 Long term (current) use of insulin: Secondary | ICD-10-CM

## 2015-05-02 DIAGNOSIS — E039 Hypothyroidism, unspecified: Secondary | ICD-10-CM | POA: Diagnosis not present

## 2015-05-02 DIAGNOSIS — E1165 Type 2 diabetes mellitus with hyperglycemia: Secondary | ICD-10-CM

## 2015-05-02 DIAGNOSIS — IMO0001 Reserved for inherently not codable concepts without codable children: Secondary | ICD-10-CM

## 2015-05-02 LAB — HEMOGLOBIN A1C: Hgb A1c MFr Bld: 7.7 % — ABNORMAL HIGH (ref 4.6–6.5)

## 2015-05-02 LAB — T4, FREE: Free T4: 0.61 ng/dL (ref 0.60–1.60)

## 2015-05-02 LAB — TSH: TSH: 5.3 u[IU]/mL — ABNORMAL HIGH (ref 0.35–4.50)

## 2015-05-02 MED ORDER — INSULIN GLARGINE 100 UNIT/ML ~~LOC~~ SOLN: 50.0000 [IU] | Freq: Every day | SUBCUTANEOUS | Status: DC

## 2015-05-02 MED ORDER — INSULIN PEN NEEDLE 31G X 6 MM MISC: Status: DC

## 2015-05-02 MED ORDER — THYROID 146.25 MG PO TABS: ORAL_TABLET | ORAL | Status: DC

## 2015-05-02 MED ORDER — REPAGLINIDE 2 MG PO TABS: 2.0000 mg | ORAL_TABLET | Freq: Three times a day (TID) | ORAL | Status: DC

## 2015-05-02 NOTE — Progress Notes (Signed)
Patient ID: Katie Cook, female   DOB: [birth year], 60 y.o.   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 Last visit 4 mo ago.  Hypothyroidism: Reviewed hx: - she had MNG >> developed compression sxs - thyroidectomy in 2011-2012 - hypothyroidism >> Levothyroxine, then Armour 6 x later: 180 mg 4/7 days and 120 in 3/7 days >> b/c insurance coverage >> switched to Levothyroxine 200 mcg daily >> decreased gradually to 137 mcg.  She takes the NatureThroid 130 mg daily: - in am - fasting - eats b'fast >30 min later: yoghurt - she takes Omeprazole prn later in the day - takes MVI in pm or evening  Last TSH:  Lab Results  Component Value Date   TSH 16.65* 03/05/2015   TSH 7.070* 01/22/2015   TSH 5.380* 12/20/2014   TSH 0.71 06/10/2014   TSH 18.030* 03/22/2014   FREET4 0.54* 03/05/2015   FREET4 0.93 01/22/2015   FREET4 1.40 12/20/2014   FREET4 1.22 06/10/2014   FREET4 1.19 03/22/2014  04/2013: TSH 0.4 (at the [facility])  DM2: Patient has been diagnosed with GDM during pregnancy, then DM2 in 2005; she started insulin in 2008.   Last hemoglobin A1c was:  Lab Results  Component Value Date   HGBA1C 7.9* 12/20/2014   HGBA1C 7.8* 09/13/2014   HGBA1C 8.4* 06/10/2014  ~8% 04/2013.  Pt is on a regimen of: - Lantus 40 units qhs - NovoLog: 26 - 30 units - with breakfast 30 - 35 units - with dinner No Januvia 100 mg daily - cannot afford >> Tradjenta 5 mg in am She was Invokana 100 mg - added 01/2014 >> initially stopped for diarrhea >> restarted 06/2014 >> but N/D >> restarted 10/2014 >> yeast inf >> stopped  She stopped  Metformin XR 500 mg at lunchtime and 1000 mg at dinner - started 09/2013 >> nausea, diarrhea. She stopped Victoza >> nausea, 3 years. She tried Actos. She tried regular  Metformin >> nausea. She tried Prandin. She tried Januvia.  Pt checks her sugars 2-3x a day and they are worse: - am: 112-120 mostly >> 55-110 >> 70-120, 137 >> 77, 90-120 >> 68, 122-260 >> 100-257 >> 116-171 >> 103-293, 330 - 2h after b'fast: 120-140 >> 74, 207 >> 141-157, 197 >> 200 >> 111-228 >> 161-261 >> 232x1 >> 131-195 - before lunch: 120-140 >> 77, 169, 269 >> 164-182, 251 >> 200-248 >> 104 >> 154, 166 >> 133, 136 >> 134-259 - 2h after lunch: 201-210 >> 77, 125-190 >> 108-147, 166, 300 >> 97, 106 >> 104-255 >> 103 >> 52, 76, 108-242 - before dinner: 90-100 >> 102-130, 260 >> 124-161 >> 69x1, 192-232 >> 181-210 >> 104-140 >> 138, 198 >> 212-321 - 2h after dinner:122, 177-260, 395 >> 158, 171-250 >> 190-260 >> see above >> 191-306 >> 329x1 >>155,  238 - bedtime: 189-190 >> 130, 200-275 >> 125-195 >> 125-190 >> see above >> 174-314 >> 142, 143 >> 137-367, 471 - nighttime: n/c >> 180, 194 >> 113, 295 >> 3 am: 55 x1 >> 67, 262 >> n/c >> 228, 250 >> 84-300 >> 157-204 Has lows. Lowest sugar was 84 x1 >> 103 >> 52; she has hypoglycemia awareness at 70.  Highest sugar was 300s >> 400s.  Meter: AccuChek Aviva.  Pt's meals are: - Breakfast: yoghurt + fruit - Lunch: salad with  [place] - Dinner: chicken, broccoli, meat - Snacks: 2-3: fruit, popcorn  She tells me that if she doesn't eat her sugars are higher.  Reviewed latest labs: - no CKD, last BUN/creatinine:  Lab Results  Component Value Date   BUN 16 04/09/2015   CREATININE 0.57 04/09/2015  03/19/2014: 17/0.75 On Losartan. - Has HL.  03/19/2014: 183/138/52/103 - last eye exam was in 01/2014. No DR.  - no numbness and tingling in her feet.  I reviewed pt's medications, allergies, PMH, social hx, family hx, and changes were documented in the history of present illness. Otherwise, unchanged from my initial visit note.  ROS: Constitutional: no weight gain, no fatigue, + hot flushes, + poor sleep, no nocturia, + problems with  concentration Eyes: no blurry vision, no xerophthalmia ENT: no sore throat, no nodules palpated in throat, no dysphagia/odynophagia Cardiovascular: no CP/SOB/no palpitations/leg swelling Respiratory: no cough/no SOB Gastrointestinal: + N/no V/+ D/+ C/no heartburn Musculoskeletal: + muscle aches/no  joint aches Skin: no rash, + hair loss  Neurological: no tremors/numbness/tingling/dizziness, + HA  PE: BP 128/74 mmHg  Pulse 70  Temp(Src) 98.3 F (36.8 C) (Oral)  Resp 12  SpO2 97% Body mass index is 44.05 kg/(m^2).  Wt Readings from Last 3 Encounters:  05/02/15 233 lb (105.688 kg)  12/20/14 234 lb 12.8 oz (106.505 kg)  11/07/14 235 lb (106.595 kg)   Constitutional: overweight, in NAD Eyes: PERRLA, EOMI, no exophthalmos ENT: moist mucous membranes, no thyromegaly, no cervical lymphadenopathy Cardiovascular: RRR, No MRG Respiratory: CTA B Gastrointestinal: abdomen soft, NT, ND, BS+ Musculoskeletal: no deformities, strength intact in all 4 Skin: moist, warm, no rashes Neurological: no tremor with outstretched hands, DTR normal in all 4  ASSESSMENT: 1. DM2, insulin-dependent, uncontrolled, without complications  2. Hypothyroidism - postsurgical - thyroidectomy 2011 - h/o benign MNG  3. Obesity  PLAN:  1. Patient with long-standing, uncontrolled diabetes, on basal-bolus insulin regimen, now off Trajenta (expensive) - as sugars are higher throughout the day >> will increase Lantus. She also would like to try Prandin again >> will start. - I advised her to:  Patient Instructions  Please increase Lantus to 50 units and move it in am.  Please continue: - NovoLog: 26 - 30 units - with breakfast 30 - 35 units - with dinner  Start Prandin 2 mg before each meal.  Please return in 3 months with your sugar log.   Please stop at the lab.  - continue checking sugars at different times of the day - check 3 times a day, rotating checks - needs a new eye exam - check Hba1c  today - Return to clinic in 3 mo with sugar log   2. Hypothyroidism - pt is taking the Naturethroid 130 mg correctly: every day, with water, >30 minutes before breakfast, separated by >4 hours from acid reflux medications, calcium, iron, multivitamins. - Reviewed TFTs from 05/2014 >> TSH high >> we increased the thyroid med dose >> will recheck on the higher dose  3. Obesity - now working with a nurse to help her lose weight!   Office Visit on 05/02/2015  Component Date Value Ref Range Status  . TSH 05/02/2015 5.30* 0.35 - 4.50 uIU/mL Final  . Free T4 05/02/2015 0.61  0.60 - 1.60 ng/dL Final  . Hgb Z6X MFr Bld 05/02/2015 7.7* 4.6 - 6.5 % Final   Glycemic Control Guidelines for People with Diabetes:Non Diabetic:  <6%Goal of Therapy: <7%Additional Action Suggested:  >8%    TSH a little high.  We'll increase her Naturethroid to 146 mg daily. Hemoglobin A1c is better, but still above target. We see changes above.

## 2015-05-02 NOTE — Patient Instructions (Addendum)
Please increase Lantus to 50 units and move it in am.  Please continue: - NovoLog: 26 - 30 units - with breakfast 30 - 35 units - with dinner  Start Prandin 2 mg before each meal.  Please return in 3 months with your sugar log.   Please stop at the lab.

## 2015-05-23 ENCOUNTER — Ambulatory Visit: Payer: 59 | Admitting: Internal Medicine

## 2015-06-30 ENCOUNTER — Other Ambulatory Visit: Payer: Self-pay | Admitting: Internal Medicine

## 2015-07-01 MED ORDER — NATURE-THROID 146.25 MG PO TABS: ORAL_TABLET | ORAL | Status: DC

## 2015-07-24 ENCOUNTER — Ambulatory Visit: Payer: 59 | Admitting: Internal Medicine

## 2015-09-12 ENCOUNTER — Ambulatory Visit (INDEPENDENT_AMBULATORY_CARE_PROVIDER_SITE_OTHER): Payer: 59 | Admitting: Internal Medicine

## 2015-09-12 ENCOUNTER — Encounter: Payer: Self-pay | Admitting: Internal Medicine

## 2015-09-12 VITALS — BP 160/94 | HR 70 | Temp 98.2°F | Resp 16 | Wt 235.1 lb

## 2015-09-12 DIAGNOSIS — E039 Hypothyroidism, unspecified: Secondary | ICD-10-CM

## 2015-09-12 DIAGNOSIS — Z794 Long term (current) use of insulin: Secondary | ICD-10-CM | POA: Diagnosis not present

## 2015-09-12 DIAGNOSIS — IMO0001 Reserved for inherently not codable concepts without codable children: Secondary | ICD-10-CM

## 2015-09-12 DIAGNOSIS — E1165 Type 2 diabetes mellitus with hyperglycemia: Secondary | ICD-10-CM | POA: Diagnosis not present

## 2015-09-12 LAB — POCT GLYCOSYLATED HEMOGLOBIN (HGB A1C): Hemoglobin A1C: 8.3

## 2015-09-12 MED ORDER — CANAGLIFLOZIN 100 MG PO TABS: 100.0000 mg | ORAL_TABLET | Freq: Every day | ORAL | Status: DC

## 2015-09-12 NOTE — Progress Notes (Addendum)
Patient ID: Katie Cook, female   DOB: July 15, 1955, 60 y.o.   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 Last visit 4 mo ago.  Hypothyroidism: Reviewed hx: - she had MNG >> developed compression sxs - thyroidectomy in 2011-2012 - hypothyroidism >> Levothyroxine, then Armour 6 x later: 180 mg 4/7 days and 120 in 3/7 days >> b/c insurance coverage >> switched to Levothyroxine 200 mcg daily >> decreased gradually to 137 mcg.  She takes the NatureThroid 130 >> 146 mg daily: - in am - fasting - eats b'fast >30 min later: yoghurt - she takes Omeprazole prn later in the day - takes MVI in pm or evening  Last TSH:  Lab Results  Component Value Date   TSH 5.30* 05/02/2015   TSH 16.65* 03/05/2015   TSH 7.070* 01/22/2015   TSH 5.380* 12/20/2014   TSH 0.71 06/10/2014   FREET4 0.61 05/02/2015   FREET4 0.54* 03/05/2015   FREET4 0.93 01/22/2015   FREET4 1.40 12/20/2014   FREET4 1.22 06/10/2014  04/2013: TSH 0.4 (at the [facility])  DM2: Patient has been diagnosed with GDM during pregnancy, then DM2 in 2005; she started insulin in 2008.   Last hemoglobin A1c was:  Lab Results  Component Value Date   HGBA1C 7.7* 05/02/2015   HGBA1C 7.9* 12/20/2014   HGBA1C 7.8* 09/13/2014  ~8% 04/2013.  Pt is on a regimen of: - Lantus 50 units in am. - NovoLog: 26 - 30 units - with breakfast 30 units - with dinner - Prandin 2 mg before each meal (only remembers once a day) >> feels this is helping Could not afford: Januvia 100 mg daily, Tradjenta 5 mg in am She was Invokana 100 mg - added 01/2014 >> initially stopped for diarrhea >> restarted 06/2014 >> but N/D >> restarted 10/2014 >> yeast inf >> stopped She stopped  Metformin XR 500 mg at lunchtime and 1000 mg at dinner - started 09/2013 >> nausea,  diarrhea. She stopped Victoza >> nausea, 3 years. She tried Actos. She tried regular Metformin >> nausea. She tried Prandin.  Pt checks her sugars 2-3x a day and they are worse: - am: 55-110 >> 70-120, 137 >> 77, 90-120 >> 68, 122-260 >> 100-257 >> 116-171 >> 103-293, 330 >> 74-238 - 2h after b'fast: 74, 207 >> 141-157, 197 >> 200 >> 111-228 >> 161-261 >> 232x1 >> 131-195 >> 167-254 - before lunch: 77, 169, 269 >> 164-182, 251 >> 200-248 >> 104 >> 154, 166 >> 133, 136 >> 134-259 >> 96, 184 - 2h after lunch:77, 125-190 >> 108-147, 166, 300 >> 97, 106 >> 104-255 >> 103 >> 52, 76, 108-242 >> 101-237, 491 - before dinner: 102-130, 260 >> 124-161 >> 69x1, 192-232 >> 181-210 >> 104-140 >> 138, 198 >> 212-321 >> 283 - 2h after dinner:158, 171-250 >> 190-260 >> see above >> 191-306 >> 329x1 >>155,  238 >> 236-274  - bedtime: 130, 200-275 >> 125-195 >> 125-190 >> see above >> 174-314 >> 142, 143 >> 137-367, 471 >> 85 - nighttime: 180, 194 >> 113, 295 >> 3 am: 55 x1 >> 67, 262 >> n/c >> 228, 250 >> 84-300 >> 157-204 >> 74x1 Has lows. Lowest sugar was 84 x1 >> 103 >> 52 >> 74; she has hypoglycemia awareness at 70.  Highest sugar was 300s >> 400s >> 491.  Meter:  AccuChek Aviva.  Pt's meals are: - Breakfast: yoghurt + fruit - Lunch: salad with [place] - Dinner: chicken, broccoli, meat - Snacks: 2-3: fruit, popcorn  She tells me that if she doesn't eat her sugars are higher.  Reviewed latest labs: - no CKD, last BUN/creatinine:  Lab Results  Component Value Date   BUN 16 04/09/2015   CREATININE 0.57 04/09/2015  03/19/2014: 17/0.75 On Losartan. - Has HL.  04/08/2015: 203/103/58/124 03/19/2014: 183/138/52/103 - last eye exam was in 01/2014. No DR.  - no numbness and tingling in her feet.  I reviewed pt's medications, allergies, PMH, social hx, family hx, and changes were documented in the history of present illness. Otherwise, unchanged from my initial visit note.  ROS: Constitutional: no  weight gain, + fatigue, + hot flushes, no nocturia Eyes: no blurry vision, no xerophthalmia ENT: no sore throat, no nodules palpated in throat, no dysphagia/odynophagia Cardiovascular: no CP/SOB/no palpitations/leg swelling Respiratory: + cough/no SOB Gastrointestinal: + N/no V/no D/+ C/no heartburn Musculoskeletal: no muscle aches/+  joint aches Skin: no rash, no hair loss  Neurological: no tremors/numbness/tingling/dizziness, no HA  PE: BP 160/94 mmHg  Pulse 70  Temp(Src) 98.2 F (36.8 C) (Oral)  Resp 16  Wt 235 lb 2 oz (106.652 kg)  SpO2 97% Body mass index is 44.45 kg/(m^2).  Wt Readings from Last 3 Encounters:  09/12/15 235 lb 2 oz (106.652 kg)  05/02/15 233 lb (105.688 kg)  12/20/14 234 lb 12.8 oz (106.505 kg)   Constitutional: overweight, in NAD Eyes: PERRLA, EOMI, no exophthalmos ENT: moist mucous membranes, no thyromegaly, no cervical lymphadenopathy Cardiovascular: RRR, No MRG Respiratory: CTA B Gastrointestinal: abdomen soft, NT, ND, BS+ Musculoskeletal: no deformities, strength intact in all 4 Skin: moist, warm, no rashes Neurological: no tremor with outstretched hands, DTR normal in all 4  ASSESSMENT: 1. DM2, insulin-dependent, uncontrolled, without complications  2. Hypothyroidism - postsurgical - thyroidectomy 2011 - h/o benign MNG  3. Obesity  PLAN:  1. Patient with long-standing, uncontrolled diabetes, on basal-bolus insulin regimen. + She also wanted to try Prandin again >> restarted at last visit. Sugars are higher now >> I suggested Invokana again, which will also help with her high BP and also her obesity. She agrees. - I advised her to:  Patient Instructions  Please continue:  - Lantus 50 units in am. - NovoLog: 26 - 30 units - with breakfast 30 units - with dinner  Stop Prandin for now.  Please add back Invokana 50 mg in am, before b'fast x 1 week, then increase to 100 mg daily. Stay very well hydrated!  - continue checking sugars at  different times of the day - check 3 times a day, rotating checks - UTD with eye exams - check Hba1c today >> 8.3% (higher) - Return to clinic in 3 mo with sugar log   2. Hypothyroidism - pt is taking the Naturethroid 146 mg  every day, with water, >30 minutes before breakfast, separated by >4 hours from acid reflux medications, calcium, iron, multivitamins. - Reviewed TFTs from 04/2015 >> TSH slightly high >> we increased the thyroid med dose to 146 mg Naturethroid - will recheck TFTs today  Patient Instructions  Please continue NatureThroid 146 mg daily.  Take the thyroid hormone every day, with water, at least 30 minutes before breakfast, separated by at least 4 hours from: - acid reflux medications - calcium - iron - multivitamins  Please come back for a follow-up appointment in 3 months.  3. Obesity -  stable - will add Invokana  Component     Latest Ref Rng 03/05/2015 05/02/2015 09/17/2015  TSH     0.35 - 4.50 uIU/mL 16.65 (H) 5.30 (H) 4.90 (H)  T4,Free(Direct)     0.60 - 1.60 ng/dL 4.780.54 (L) 2.950.61 6.210.57 (L)  Triiodothyronine,Free,Serum     2.3 - 4.2 pg/mL   4.2   TSH improving, but still above normal >> will increase Naturethroid from 146 to 162 mg daily and will recheck TFTs at next visit.

## 2015-09-12 NOTE — Patient Instructions (Addendum)
Please continue:  - Lantus 50 units in am. - NovoLog: 26 - 30 units - with breakfast 30 units - with dinner  Stop Prandin for now.  Please add back Invokana 50 mg in am, before b'fast x 1 week, then increase to 100 mg daily. Stay very well hydrated!  Please continue NatureThroid 146 mg daily.  Take the thyroid hormone every day, with water, at least 30 minutes before breakfast, separated by at least 4 hours from: - acid reflux medications - calcium - iron - multivitamins  Please come back for a follow-up appointment in 3 months.

## 2015-09-17 ENCOUNTER — Other Ambulatory Visit (INDEPENDENT_AMBULATORY_CARE_PROVIDER_SITE_OTHER): Payer: 59

## 2015-09-17 ENCOUNTER — Encounter: Payer: Self-pay | Admitting: Internal Medicine

## 2015-09-17 ENCOUNTER — Other Ambulatory Visit: Payer: Self-pay | Admitting: *Deleted

## 2015-09-17 ENCOUNTER — Telehealth: Payer: Self-pay | Admitting: Internal Medicine

## 2015-09-17 DIAGNOSIS — E039 Hypothyroidism, unspecified: Secondary | ICD-10-CM

## 2015-09-17 LAB — T4, FREE: Free T4: 0.57 ng/dL — ABNORMAL LOW (ref 0.60–1.60)

## 2015-09-17 LAB — TSH: TSH: 4.9 u[IU]/mL — ABNORMAL HIGH (ref 0.35–4.50)

## 2015-09-17 LAB — T3, FREE: T3, Free: 4.2 pg/mL (ref 2.3–4.2)

## 2015-09-17 MED ORDER — INSULIN PEN NEEDLE 32G X 4 MM MISC: Status: DC

## 2015-09-17 NOTE — Telephone Encounter (Signed)
Patient need a prescription for pen needles 4 mm for medication or novalog.

## 2015-09-18 MED ORDER — THYROID 162.5 MG PO TABS: ORAL_TABLET | ORAL | Status: DC

## 2015-09-18 NOTE — Addendum Note (Signed)
Addended by: Carlus PavlovGHERGHE, Dez Stauffer on: 09/18/2015 03:01 PM   Modules accepted: Orders, Medications

## 2015-09-22 ENCOUNTER — Telehealth: Payer: Self-pay | Admitting: Internal Medicine

## 2015-09-22 ENCOUNTER — Telehealth: Payer: Self-pay | Admitting: Family Medicine

## 2015-09-22 MED ORDER — THYROID 162.5 MG PO TABS: ORAL_TABLET | ORAL | Status: DC

## 2015-09-22 NOTE — Telephone Encounter (Signed)
Patient said the pharmacy stated they never received [name] prescription.

## 2015-09-22 NOTE — Telephone Encounter (Signed)
[facility] [address]

## 2015-09-22 NOTE — Telephone Encounter (Signed)
Rx for nature thyroid resubmitted.

## 2015-09-22 NOTE — Telephone Encounter (Signed)
[facility] called, [facility], they said they never received it.

## 2015-09-22 NOTE — Telephone Encounter (Signed)
Rx for nature thyroid resubmitted. 

## 2015-09-25 ENCOUNTER — Telehealth: Payer: Self-pay | Admitting: Internal Medicine

## 2015-09-25 MED ORDER — CANAGLIFLOZIN 100 MG PO TABS: 100.0000 mg | ORAL_TABLET | Freq: Every day | ORAL | Status: DC

## 2015-09-25 NOTE — Telephone Encounter (Signed)
Patient calling to report she was prescribed canagliflozin (INVOKANA) 100 MG TABS tablet and now has a yeast infection.  She is requesting a prescription as she has tried otc creams which have not helped.  [facility] [address]

## 2015-09-26 ENCOUNTER — Telehealth: Payer: Self-pay | Admitting: Internal Medicine

## 2015-09-26 MED ORDER — FLUCONAZOLE 150 MG PO TABS: 150.0000 mg | ORAL_TABLET | Freq: Once | ORAL | Status: DC

## 2015-09-26 NOTE — Telephone Encounter (Signed)
Diflucan 150 mg tablet, one time

## 2015-09-26 NOTE — Telephone Encounter (Signed)
Pt has yeast infection for invokana OTC meds not helping please advise

## 2015-09-26 NOTE — Telephone Encounter (Signed)
rx submitted and pt advised. Pt advised of call back on Monday if her symptoms have not gotten any better.

## 2015-10-30 ENCOUNTER — Other Ambulatory Visit (HOSPITAL_COMMUNITY): Payer: Self-pay | Admitting: Physician Assistant

## 2015-10-30 DIAGNOSIS — R1011 Right upper quadrant pain: Secondary | ICD-10-CM

## 2015-11-06 ENCOUNTER — Ambulatory Visit (HOSPITAL_COMMUNITY): Payer: 59

## 2015-11-06 ENCOUNTER — Telehealth: Payer: Self-pay | Admitting: Internal Medicine

## 2015-11-06 NOTE — Telephone Encounter (Signed)
#   [phone] pharmacy #

## 2015-11-06 NOTE — Telephone Encounter (Signed)
Pt states the lantus is not going to be covered, the toujeo is now the preferred insulin  [facility]

## 2015-11-10 ENCOUNTER — Telehealth: Payer: Self-pay

## 2015-11-10 ENCOUNTER — Other Ambulatory Visit: Payer: Self-pay

## 2015-11-10 MED ORDER — INSULIN GLARGINE 300 UNIT/ML ~~LOC~~ SOPN: 50.0000 [IU] | PEN_INJECTOR | Freq: Every day | SUBCUTANEOUS | 1 refills | Status: DC

## 2015-11-10 NOTE — Telephone Encounter (Signed)
Yes, same dose

## 2015-11-10 NOTE — Telephone Encounter (Signed)
Sent in patient toujeo and discontinued the lantus; per MD.

## 2015-11-10 NOTE — Telephone Encounter (Signed)
PT called back, she stated she has not had any insulin in 2 days, needs to know what Dr. Elvera Lennox wants to do.

## 2015-11-11 ENCOUNTER — Other Ambulatory Visit: Payer: Self-pay

## 2015-11-11 NOTE — Telephone Encounter (Signed)
Checked to make sure same dose was added to [employer] per insurance would not cover Lantus.

## 2015-12-26 ENCOUNTER — Ambulatory Visit: Payer: 59 | Admitting: Internal Medicine

## 2016-01-05 ENCOUNTER — Ambulatory Visit (INDEPENDENT_AMBULATORY_CARE_PROVIDER_SITE_OTHER): Payer: 59 | Admitting: Neurology

## 2016-01-05 ENCOUNTER — Encounter: Payer: Self-pay | Admitting: Neurology

## 2016-01-05 VITALS — BP 162/72 | HR 60 | Ht 61.0 in | Wt 234.0 lb

## 2016-01-05 DIAGNOSIS — R413 Other amnesia: Secondary | ICD-10-CM

## 2016-01-05 DIAGNOSIS — R430 Anosmia: Secondary | ICD-10-CM

## 2016-01-05 DIAGNOSIS — H9319 Tinnitus, unspecified ear: Secondary | ICD-10-CM | POA: Diagnosis not present

## 2016-01-05 DIAGNOSIS — R4189 Other symptoms and signs involving cognitive functions and awareness: Secondary | ICD-10-CM | POA: Diagnosis not present

## 2016-01-05 DIAGNOSIS — G3184 Mild cognitive impairment, so stated: Secondary | ICD-10-CM | POA: Insufficient documentation

## 2016-01-05 DIAGNOSIS — R42 Dizziness and giddiness: Secondary | ICD-10-CM | POA: Diagnosis not present

## 2016-01-05 DIAGNOSIS — F05 Delirium due to known physiological condition: Secondary | ICD-10-CM

## 2016-01-05 DIAGNOSIS — R41840 Attention and concentration deficit: Secondary | ICD-10-CM

## 2016-01-05 DIAGNOSIS — H9193 Unspecified hearing loss, bilateral: Secondary | ICD-10-CM

## 2016-01-05 DIAGNOSIS — R410 Disorientation, unspecified: Secondary | ICD-10-CM

## 2016-01-05 NOTE — Progress Notes (Signed)
[facility]    Provider:  Dr Lucia Gaskins Referring Provider: Gildardo Cranker, MD Primary Care Physician:   Duane Lope, MD  CC:  Confusion and memory loss  HPI:  Katie Cook is a 60 y.o. female here as a referral from Dr. Tenny Craw for confusion and memory loss. PMHx of hypothyroidism, hypertension, type 2 diabetes, chronic pain, morbid obesity, diabetes uncontolled, low baco pain, generalized abdominal pain, stress and adjustment reaction. She has had memory problems one year ago with misplacing things, forgetting little things but worsening in the last 3 months. She is driving and she knows where she is going and she gets lost, she turns around, she misses the exits but she makes it which is annoying. She is having a hard time focusing on what she has to do next. She doesn;t remember what she needs to do. For example if she has to make a lot of phone calls she forgets which ones to make and she forgets why and then she loses the list. She has to think about what she needs to do. She lives a lone. She forgets to pay the electrical bill. Work is not going well. She is making a lot of errors at work. There has been a lot of stress. She is not sleeping well. She has pain in the low back, left leg and chronic abdominal pain as well.  She forgets when she is cooking,more difficulty multi-tasking. Slowly progressive and worsening memory changes. Stress makes it worse. Nothing makes it better. No inciting events of previous head trauma. No other associated symptoms or modifying factors. No FHx of dementia. No hallucinations or delusions. Grandparents and father lived into their 32s. She denies any snoring or other symptoms of OSA.   Reviewed notes, labs and imaging from outside physicians, which showed:  Reviewed notes from [facility]. Patient was referred for therapy however she has not been able to go. She is getting foggy mentally, and forgets what day it is, and has  gotten lost driving. She was diagnosed with memory difficulties, stress and adjustment reaction and chronic pain. Patient needs to have a counseling appointment but has not been able to afford or arrange it. Patient going to reapply for Medicaid. She has also reported in the past dizziness, heart time concentrating, feeling bad. There has been stress at work. Reports dizziness, forgetfulness, pain in the lateral thigh which wakes her up at night. Also reporting hot flashes, profuse sweating at times and chills, hard to concentrate, feeling very stressed by health and home and Money problems, having to move out of her house, and having no water so having to move. She has poor sleep, making mistakes at work and has a hard time concentrating. B12 lab was drawn at pcp.   MRI of the brain 2007 (I only have report no images):   Comparison:  Digitized images 11/14/02  from [facility].  Findings:  Sagittal images show normal pituitary, subfrontal region, and cerebellar tonsils.  T2 weighted images show normal ventricles, cisterns, and sulci.  FLAIR images show no abnormal signal in the cortex or white matter.  Diffusion images are negative.  T1 weighted images show normal anterior and inferior frontal lobes and gyri recti.  No suprasellar, parasellar, or subfrontal mass is seen.  Post infusion there is no abnormal enhancement.  There is only minimal mucosal thickening in the ethmoids.  No orbital masses are seen.  The sphenoid sinus is well aerated.  No nasal abnormality  is specifically observed although the study was not performed as an MRI of the face.  Compared with the prior exam I see no interval change and no new abnormality.  The previous exam was interpreted as normal.  IMPRESSION:  1.  Unremarkable cranial MRI.  2.  Specifically no intracranial abnormalities are seen which might explain the patient's anosmia.  A1c was 7.20 December 2014, BUN 12 and creatinine 0.57 December 2016, TSH 1.14  07/10/2012.  Review of Systems: Patient complains of symptoms per HPI as well as the following symptoms: No CP, No SOB. Pertinent negatives per HPI. All others negative.   Social History   Social History  . Marital status: Legally Separated    Spouse name: N/A  . Number of children: 3  . Years of education: Some college   Occupational History  . Not on file.   Social History Main Topics  . Smoking status: Never Smoker  . Smokeless tobacco: Never Used  . Alcohol use No  . Drug use: No  . Sexual activity: Not on file   Other Topics Concern  . Not on file   Social History Narrative   Lives alone   Caffeine use: Decaf tea. Soda rarely         Family History  Problem Relation Age of Onset  . Ovarian cancer Mother   . Cancer Mother   . Cancer Father     pancreatic   . Stroke Father   . Diabetes Sister   . Cancer Maternal Aunt     breast cancer   . Diabetes Maternal Grandmother   . Cancer Maternal Aunt     thyroid   . Hypertension Sister   . Stroke Paternal Uncle     Past Medical History:  Diagnosis Date  . Diabetes mellitus without complication (HCC)   . Hypertension   . Thyroid disease   . Uncontrolled diabetes mellitus type 2 without complications (HCC) 07/26/2013    Past Surgical History:  Procedure Laterality Date  . CESAREAN SECTION    . THYROID SURGERY      Current Outpatient Prescriptions  Medication Sig Dispense Refill  . beclomethasone (QVAR) 40 MCG/ACT inhaler Inhale 2 puffs into the lungs 2 (two) times daily.    . carvedilol (COREG) 12.5 MG tablet Take 12.5 mg by mouth daily.    Marland Kitchen. glucose blood (ONE TOUCH ULTRA TEST) test strip Use to check blood sugar 3 times per day. 300 each 1  . Insulin Glargine (TOUJEO SOLOSTAR) 300 UNIT/ML SOPN Inject 50 Units into the skin daily with breakfast. 10 pen 1  . Insulin Pen Needle 32G X 4 MM MISC Use to inject insulin 4 times daily. 130 each 2  . Lancets (ONETOUCH ULTRASOFT) lancets Use to test blood sugar 3  times daily as instructed. Dx: E11.65 300 each 3  . losartan-hydrochlorothiazide (HYZAAR) 100-25 MG per tablet Take 1 tablet by mouth daily.    Marland Kitchen. NOVOLOG FLEXPEN 100 UNIT/ML FlexPen Inject 30 Units into the skin 3 (three) times daily with meals.     Marland Kitchen. omeprazole (PRILOSEC) 20 MG capsule Take 20 mg by mouth 2 (two) times daily.    Letta Pate. ONETOUCH VERIO test strip Use to test blood sugar 3  to 4 times daily as  instructed. 400 each 1  . repaglinide (PRANDIN) 2 MG tablet Take 1 tablet (2 mg total) by mouth 3 (three) times daily before meals. 270 tablet 1  . Thyroid (NATURE-THROID) 162.5 MG TABS Take 1 tablet daily in  the morning, fasting 90 tablet 1   No current facility-administered medications for this visit.     Allergies as of 01/05/2016 - Review Complete 09/12/2015  Allergen Reaction Noted  . Ceclor [cefaclor] Rash 06/13/2012  . Sulfa antibiotics Rash 06/13/2012    Vitals: BP (!) 184/81 (BP Location: Right Arm, Patient Position: Sitting, Cuff Size: Normal)   Pulse 64   Ht 5\' 1"  (1.549 m)   Wt 234 lb (106.1 kg)   BMI 44.21 kg/m  Last Weight:  Wt Readings from Last 1 Encounters:  01/05/16 234 lb (106.1 kg)   Last Height:   Ht Readings from Last 1 Encounters:  01/05/16 5\' 1"  (1.549 m)    Physical exam: Exam: Gen: NAD, conversant, well nourised, obese, well groomed                     CV: RRR, no MRG. No Carotid Bruits. No peripheral edema, warm, nontender Eyes: Conjunctivae clear without exudates or hemorrhage  Neuro: Detailed Neurologic Exam  Speech:    Speech is normal; fluent and spontaneous with normal comprehension.  Cognition:  MMSE - Mini Mental State Exam 01/05/2016  Orientation to time 5  Orientation to Place 5  Registration 3  Attention/ Calculation 5  Recall 3  Language- name 2 objects 2  Language- repeat 1  Language- follow 3 step command 3  Language- read & follow direction 1  Write a sentence 1  Copy design 1  Total score 30      The patient is  oriented to person, place, and time;     recent and remote memory intact;     language fluent;     normal attention, concentration,     fund of knowledge Cranial Nerves:    The pupils are equal, round, and reactive to light. The fundi are normal and spontaneous venous pulsations are present. Visual fields are full to finger confrontation. Extraocular movements are intact. Trigeminal sensation is intact and the muscles of mastication are normal. The face is symmetric. The palate elevates in the midline. Hearing intact. Voice is normal. Shoulder shrug is normal. The tongue has normal motion without fasciculations.   Coordination:    Normal finger to nose and heel to shin. Normal rapid alternating movements.   Gait:    Wide based due to large body habitus  Motor Observation:    No asymmetry, no atrophy, and no involuntary movements noted. Tone:    Normal muscle tone.    Posture:    Posture is normal. normal erect    Strength:    Strength is V/V in the upper and lower limbs.      Sensation: intact to LT     Reflex Exam:  DTR's: Absent AJs otherwise deep tendon reflexes in the upper and lower extremities are normal bilaterally.   Toes:    The toes are equivocal bilaterally.   Clonus:    Clonus is absent.      Assessment/Plan:  Katie Cook is a 60 y.o. female here as a referral from Dr. Tenny Craw for confusion and memory loss. PMHx of hypothyroidism, hypertension, type 2 diabetes, chronic pain, morbid obesity, diabetes uncontolled, low baco pain, generalized abdominal pain, stress and adjustment reaction. She has multiple coognitive complaints. Cognitive changes likely multifactorial due to pain, poor sleep, uncontrolled medical conditions, stress and normal cognitive aging. However she needs a workup to evaluate. She needs therapy for her stress and insomnia. She also reports hearing loss and ringing in  the ears and dizziness.   MRI of the brain Discussed therapy, highly  encouraged for her stress and insomnia Can refer to neurocognitive testing Recommend follow up with Dr. Tenny Craw for B12 (notes state it was drawn at last appointment0 as well as management of diabetes and her thyroid (she follows with endocrinologist) Discussed good sleep hygiene.   Cc: Dr. Concepcion Living, MD  [facility] [address] [place], [place] [address]  Phone [phone] Fax [phone]

## 2016-01-05 NOTE — Patient Instructions (Signed)
Remember to drink plenty of fluid, eat healthy meals and do not skip any meals. Try to eat protein with a every meal and eat a healthy snack such as fruit or nuts in between meals. Try to keep a regular sleep-wake schedule and try to exercise daily, particularly in the form of walking, 20-30 minutes a day, if you can.   As far as diagnostic testing: MRI of the brain, Neurocognitive testing. Recommend [facility] for therapy.  I would like to see you back after workup complete, sooner if we need to. Please call us with any interim questions, concerns, problems, updates or refill requests.   Our phone number is [phone]. We also have an after hours call service for urgent matters and there is a physician on-call for urgent questions. For any emergencies you know to call 911 or go to the nearest emergency room

## 2016-01-08 ENCOUNTER — Ambulatory Visit (INDEPENDENT_AMBULATORY_CARE_PROVIDER_SITE_OTHER): Payer: 59 | Admitting: Psychology

## 2016-01-08 DIAGNOSIS — F4323 Adjustment disorder with mixed anxiety and depressed mood: Secondary | ICD-10-CM | POA: Diagnosis not present

## 2016-01-08 DIAGNOSIS — F419 Anxiety disorder, unspecified: Secondary | ICD-10-CM

## 2016-01-09 ENCOUNTER — Ambulatory Visit (INDEPENDENT_AMBULATORY_CARE_PROVIDER_SITE_OTHER): Payer: 59 | Admitting: Internal Medicine

## 2016-01-09 ENCOUNTER — Encounter: Payer: Self-pay | Admitting: Internal Medicine

## 2016-01-09 VITALS — BP 132/78 | Ht 61.0 in | Wt 233.0 lb

## 2016-01-09 DIAGNOSIS — Z794 Long term (current) use of insulin: Secondary | ICD-10-CM

## 2016-01-09 DIAGNOSIS — E039 Hypothyroidism, unspecified: Secondary | ICD-10-CM | POA: Diagnosis not present

## 2016-01-09 DIAGNOSIS — IMO0001 Reserved for inherently not codable concepts without codable children: Secondary | ICD-10-CM

## 2016-01-09 DIAGNOSIS — E1165 Type 2 diabetes mellitus with hyperglycemia: Secondary | ICD-10-CM

## 2016-01-09 DIAGNOSIS — E669 Obesity, unspecified: Secondary | ICD-10-CM

## 2016-01-09 LAB — T4, FREE: Free T4: 0.62 ng/dL (ref 0.60–1.60)

## 2016-01-09 LAB — POCT GLYCOSYLATED HEMOGLOBIN (HGB A1C): Hemoglobin A1C: 7.5

## 2016-01-09 LAB — T3, FREE: T3, Free: 3 pg/mL (ref 2.3–4.2)

## 2016-01-09 LAB — TSH: TSH: 6.06 u[IU]/mL — ABNORMAL HIGH (ref 0.35–4.50)

## 2016-01-09 MED ORDER — BASAGLAR KWIKPEN 100 UNIT/ML ~~LOC~~ SOPN: 50.0000 [IU] | PEN_INJECTOR | Freq: Every day | SUBCUTANEOUS | 3 refills | Status: DC

## 2016-01-09 MED ORDER — NATURE-THROID 195 MG PO TABS: 195.0000 mg | ORAL_TABLET | Freq: Every day | ORAL | 1 refills | Status: DC

## 2016-01-09 MED ORDER — INSULIN LISPRO 200 UNIT/ML ~~LOC~~ SOPN: 20.0000 [IU] | PEN_INJECTOR | Freq: Two times a day (BID) | SUBCUTANEOUS | 3 refills | Status: DC

## 2016-01-09 NOTE — Patient Instructions (Addendum)
Patient Instructions  Please continue:  - Basaglar 50 units in am. - Humalog U200: 20 - 25 units - with breakfast 30 units - with dinner  Please continue NatureThroid 162.5 mg daily.  Take the thyroid hormone every day, with water, at least 30 minutes before breakfast, separated by at least 4 hours from: - acid reflux medications - calcium - iron - multivitamins  Please stop at the lab.  Please come back for a follow-up appointment in 3 months.

## 2016-01-09 NOTE — Progress Notes (Addendum)
Patient ID: Katie Cook, female   DOB: 05-Dec-1955, 60 y.o.   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 Last visit 3.5 mo ago.  Hypothyroidism: Reviewed hx: - she had MNG >> developed compression sxs - thyroidectomy in 2011-2012 - hypothyroidism >> Levothyroxine, then Armour 6 x later: 180 mg 4/7 days and 120 in 3/7 days >> b/c insurance coverage >> switched to Levothyroxine 200 mcg daily >> decreased gradually to 137 mcg.  She takes the NatureThroid 130 >> 146 >> 162 mg daily: - in am - fasting - eats b'fast >30 min later: yoghurt - she takes Omeprazole prn later in the day - takes MVI in pm or evening  Last TSH:  Lab Results  Component Value Date   TSH 4.90 (H) 09/17/2015   TSH 5.30 (H) 05/02/2015   TSH 16.65 (H) 03/05/2015   TSH 7.070 (H) 01/22/2015   TSH 5.380 (H) 12/20/2014   FREET4 0.57 (L) 09/17/2015   FREET4 0.61 05/02/2015   FREET4 0.54 (L) 03/05/2015   FREET4 0.93 01/22/2015   FREET4 1.40 12/20/2014  04/2013: TSH 0.4 (at the [facility])  DM2: Patient has been diagnosed with GDM during pregnancy, then DM2 in 2005; she started insulin in 2008.   Last hemoglobin A1c was:  Lab Results  Component Value Date   HGBA1C 8.3 09/12/2015   HGBA1C 7.7 (H) 05/02/2015   HGBA1C 7.9 (H) 12/20/2014  ~8% 04/2013.  Pt is on a regimen of: - Lantus 50 units in am. [place] Poli x2 mo >> not covered anymore.  - NovoLog: - 20 units - before breakfast - 25-30 units - before dinner Could not afford: Januvia 100 mg daily, Tradjenta 5 mg in am She was Invokana 100 mg - added 01/2014 >> initially stopped for diarrhea >> restarted 06/2014 >> but N/D >> restarted 10/2014 >> yeast inf >> stopped She stopped  Metformin XR 500 mg at lunchtime and 1000 mg at dinner - started 09/2013 >> nausea,  diarrhea. She stopped Victoza >> nausea, 3 years. She tried Actos. She tried regular Metformin >> nausea. She tried Prandin.  Pt checks her sugars 2-3x a day and they are better: - am: \70-120, 137 >> 77, 90-120 >> 68, 122-260 >> 100-257 >> 116-171 >> 103-293, 330 >> 74-238 >> 120-142,160, 200 - 2h after b'fast: 74, 207 >> 141-157, 197 >> 200 >> 111-228 >> 161-261 >> 232x1 >> 131-195 >> 167-254 >> n/c - before lunch: 164-182, 251 >> 200-248 >> 104 >> 154, 166 >> 133, 136 >> 134-259 >> 96, 184 >> 175 - 2h after lunch:108-147, 166, 300 >> 97, 106 >> 104-255 >> 103 >> 52, 76, 108-242 >> 101-237, 491 >> 206 - before dinner: 124-161 >> 69x1, 192-232 >> 181-210 >> 104-140 >> 138, 198 >> 212-321 >> 283 - 2h after dinner:158, 171-250 >> 190-260 >> see above >> 191-306 >> 329x1 >>155,  238 >> 236-274  >> 156 - bedtime: 130, 200-275 >> 125-195 >> 125-190 >> see above >> 174-314 >> 142, 143 >> 137-367, 471 >> 85 >> 180, 249, 262 - nighttime: 180, 194 >> 113, 295 >> 3 am: 55 x1 >> 67, 262 >> n/c >> 228, 250 >> 84-300 >> 157-204 >> 74x1 >> 77, 176 Has lows. Lowest sugar was 84 x1 >> 103 >> 52 >> 74; she has hypoglycemia awareness at 70.  Highest  sugar was 300s >> 400s >> 491.  Meter: AccuChek Aviva.  Pt's meals are better: cut down portions, eating earlier in the day: - Breakfast: yoghurt + fruit - Lunch: salad with [place] - Dinner: chicken, broccoli, meat - Snacks: 2-3: fruit, popcorn Seldom eats out.  She tells me that if she doesn't eat her sugars are higher.  Reviewed latest labs: - no CKD, last BUN/creatinine:  Lab Results  Component Value Date   BUN 16 04/09/2015   CREATININE 0.57 04/09/2015  03/19/2014: 17/0.75 On Losartan. - Has HL.  04/08/2015: 203/103/58/124 03/19/2014: 183/138/52/103 - last eye exam was in 12/2014. No DR.  - no numbness and tingling in her feet.  I reviewed pt's medications, allergies, PMH, social hx, family hx, and changes were documented in the history of  present illness. Otherwise, unchanged from my initial visit note.  ROS: Constitutional: +weight gain, + fatigue, + hot flushes, no nocturia Eyes: + blurry vision, no xerophthalmia ENT: no sore throat, no nodules palpated in throat, no dysphagia/odynophagia Cardiovascular: no CP/SOB/no palpitations/+ leg swelling Respiratory: + cough/no SOB Gastrointestinal: + N/+ V/+ D/+ C/+ heartburn Musculoskeletal: + muscle aches/+  joint aches Skin: no rash, no hair loss  Neurological: no tremors/numbness/tingling/dizziness, + HA  PE: BP 132/78   Ht 5\' 1"  (1.549 m)   Wt 233 lb (105.7 kg)   BMI 44.02 kg/m  Body mass index is 44.02 kg/m.  Wt Readings from Last 3 Encounters:  01/09/16 233 lb (105.7 kg)  01/05/16 234 lb (106.1 kg)  09/12/15 235 lb 2 oz (106.7 kg)   Constitutional: overweight, in NAD Eyes: PERRLA, EOMI, no exophthalmos ENT: moist mucous membranes, no thyromegaly, no cervical lymphadenopathy Cardiovascular: RRR, No MRG Respiratory: CTA B Gastrointestinal: abdomen soft, NT, ND, BS+ Musculoskeletal: no deformities, strength intact in all 4 Skin: moist, warm, no rashes Neurological: no tremor with outstretched hands, DTR normal in all 4  ASSESSMENT: 1. DM2, insulin-dependent, uncontrolled, without complications  2. Hypothyroidism - postsurgical - thyroidectomy 2011 - h/o benign MNG  3. Obesity  PLAN:  1. Patient with long-standing, uncontrolled diabetes, on basal-bolus insulin regimen. I suggested Invokana at last visit, to also help with her high BP and also her obesity, but she did not start afraid of side effects. However, she improved her diet >> Sugars are better. Will switch to Basaglar and U200 Humalog (formulary change). - I advised her to:   Patient Instructions  Please continue:  - Basaglar 50 units in am. - Humalog U200: 20 - 25 units - with breakfast 30 units - with dinner  - continue checking sugars at different times of the day - check 3 times a day,  rotating checks - Needs a new eye exam - refused flu shot - check Hba1c today >> 7.5% (better) - Return to clinic in 3 mo with sugar log   2. Hypothyroidism - pt is taking the Naturethroid 162 mg  every day, with water, >30 minutes before breakfast, separated by >4 hours from acid reflux medications, calcium, iron, multivitamins. - Reviewed TFTs from 09/2015 >> TSH slightly high >> we increased the thyroid med dose to 162 mg Naturethroid - will recheck TFTs today  Patient Instructions  Please continue NatureThroid 162.5 mg daily.  Take the thyroid hormone every day, with water, at least 30 minutes before breakfast, separated by at least 4 hours from: - acid reflux medications - calcium - iron - multivitamins  Please stop at the lab.  Please come back for a follow-up appointment in  3 months.  3. Obesity - weight stable  Needs refills LT4.  Office Visit on 01/09/2016  Component Date Value Ref Range Status  . TSH 01/09/2016 6.06* 0.35 - 4.50 uIU/mL Final  . Free T4 01/09/2016 0.62  0.60 - 1.60 ng/dL Final   Comment: Specimens from patients who are undergoing biotin therapy and /or ingesting biotin supplements may contain high levels of biotin.  The higher biotin concentration in these specimens interferes with this Free T4 assay.  Specimens that contain high levels  of biotin may cause false high results for this Free T4 assay.  Please interpret results in light of the total clinical presentation of the patient.    . T3, Free 01/09/2016 3.0  2.3 - 4.2 pg/mL Final  . Hemoglobin A1C 01/09/2016 7.5   Final   TSH still high. We'll need to increase Naturethroid even more. I will recheck her thyroid tests when she comes back.  Carlus Pavlov, MD PhD [facility]

## 2016-01-09 NOTE — Addendum Note (Signed)
Addended by: Bobbye RiggsWALKER, Mykelti Goldenstein L on: 01/09/2016 04:56 PM   Modules accepted: Orders

## 2016-01-09 NOTE — Addendum Note (Signed)
Addended by: Carlus PavlovGHERGHE, Marnita Poirier on: 01/09/2016 05:53 PM   Modules accepted: Orders

## 2016-01-12 ENCOUNTER — Telehealth: Payer: Self-pay

## 2016-01-12 ENCOUNTER — Ambulatory Visit: Payer: 59 | Admitting: Internal Medicine

## 2016-01-12 NOTE — Telephone Encounter (Signed)
Attempted to contact patient regarding lab results, and increase in medication. No voicemail to leave message, and no answer. Will try again later.

## 2016-01-13 ENCOUNTER — Other Ambulatory Visit: Payer: Self-pay

## 2016-01-13 ENCOUNTER — Telehealth: Payer: Self-pay

## 2016-01-13 ENCOUNTER — Telehealth: Payer: Self-pay | Admitting: Internal Medicine

## 2016-01-13 NOTE — Telephone Encounter (Signed)
    Patient calling for lab results 

## 2016-01-13 NOTE — Telephone Encounter (Signed)
Attempted to contact patient about lab results, no answer and no voicemail to leave message.

## 2016-01-15 ENCOUNTER — Telehealth: Payer: Self-pay | Admitting: Neurology

## 2016-01-15 NOTE — Telephone Encounter (Signed)
Patient called regarding scheduling MRI appointment. Please call.

## 2016-01-16 ENCOUNTER — Telehealth: Payer: Self-pay

## 2016-01-16 NOTE — Telephone Encounter (Signed)
Called patient and gave lab results. Patient had no questions or concerns.  

## 2016-01-21 NOTE — Telephone Encounter (Signed)
Patient's MRI is requiring a P2P. Please call [phone] and select option 3. The Case number is [id number].

## 2016-01-22 NOTE — Telephone Encounter (Signed)
[id number] expires 02/2016

## 2016-01-26 ENCOUNTER — Ambulatory Visit (INDEPENDENT_AMBULATORY_CARE_PROVIDER_SITE_OTHER): Payer: 59 | Admitting: Psychology

## 2016-01-26 ENCOUNTER — Encounter: Payer: Self-pay | Admitting: Psychology

## 2016-01-26 DIAGNOSIS — R413 Other amnesia: Secondary | ICD-10-CM | POA: Diagnosis not present

## 2016-01-26 DIAGNOSIS — F4323 Adjustment disorder with mixed anxiety and depressed mood: Secondary | ICD-10-CM

## 2016-01-26 DIAGNOSIS — R41840 Attention and concentration deficit: Secondary | ICD-10-CM
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
## 2016-01-28 ENCOUNTER — Ambulatory Visit (INDEPENDENT_AMBULATORY_CARE_PROVIDER_SITE_OTHER): Payer: 59 | Admitting: Psychology

## 2016-01-28 DIAGNOSIS — F423 Hoarding disorder: Secondary | ICD-10-CM

## 2016-01-28 DIAGNOSIS — R419 Unspecified symptoms and signs involving cognitive functions and awareness: Secondary | ICD-10-CM | POA: Diagnosis not present

## 2016-01-29 ENCOUNTER — Ambulatory Visit (INDEPENDENT_AMBULATORY_CARE_PROVIDER_SITE_OTHER): Payer: 59 | Admitting: Psychology

## 2016-01-29 DIAGNOSIS — R413 Other amnesia: Secondary | ICD-10-CM | POA: Diagnosis not present

## 2016-01-29 NOTE — Progress Notes (Signed)
   Neuropsychology Note  Katie LionsMaria E Cook returned today for 3 hours of neuropsychological testing with technician, Wallace Kellerana Chamberlain, BS, under the supervision of Dr. Elvis CoilMaryBeth Bailar. The patient did not appear overtly distressed by the testing session, per behavioral observation or via self-report to the technician. Rest breaks were offered. Katie LionsMaria E Cook will return within 2 weeks for a feedback session with Dr. Alinda DoomsBailar at which time her test performances, clinical impressions and treatment recommendations will be reviewed in detail. The patient understands she can contact our office should she require our assistance before this time.  Full report to follow.

## 2016-02-03 ENCOUNTER — Ambulatory Visit
Admission: RE | Admit: 2016-02-03 | Discharge: 2016-02-03 | Disposition: A | Discharge status: Home / Self Care | Payer: 59 | Source: Ambulatory Visit | Attending: Neurology | Admitting: Neurology

## 2016-02-03 DIAGNOSIS — R410 Disorientation, unspecified: Secondary | ICD-10-CM

## 2016-02-03 DIAGNOSIS — R413 Other amnesia: Secondary | ICD-10-CM | POA: Diagnosis not present

## 2016-02-03 DIAGNOSIS — R42 Dizziness and giddiness: Secondary | ICD-10-CM

## 2016-02-03 DIAGNOSIS — H9193 Unspecified hearing loss, bilateral: Secondary | ICD-10-CM

## 2016-02-03 DIAGNOSIS — R41840 Attention and concentration deficit: Secondary | ICD-10-CM

## 2016-02-03 DIAGNOSIS — H9319 Tinnitus, unspecified ear: Secondary | ICD-10-CM

## 2016-02-03 DIAGNOSIS — F05 Delirium due to known physiological condition: Secondary | ICD-10-CM

## 2016-02-03 DIAGNOSIS — R430 Anosmia: Secondary | ICD-10-CM

## 2016-02-03 MED ORDER — GADOBENATE DIMEGLUMINE 529 MG/ML IV SOLN
20.0000 mL | Freq: Once | INTRAVENOUS | Status: AC | PRN
  Administered 2016-02-03: 20 mL via INTRAVENOUS

## 2016-02-03 NOTE — Telephone Encounter (Signed)
Katie HesselbachMaria is having her MRI at [facility] on 02/03/16

## 2016-02-05 ENCOUNTER — Telehealth: Payer: Self-pay | Admitting: *Deleted

## 2016-02-05 NOTE — Telephone Encounter (Signed)
-----   Message from Anson FretAntonia B Ahern, MD sent at 02/04/2016  5:05 PM EDT ----- MRI of the brain is unremarkable. White matter changes were seen. These white matter changes can be due to vascular risk factors such as hypertension, hyperlipidemia, diabetes, smoking but they can also be seen in normal aging and and they have remained relatively stable since her 2007 exam. These white matter changes are very common. There is no cause for patient's symptoms seen on MRI of the brain which is very good.

## 2016-02-05 NOTE — Telephone Encounter (Signed)
Called and spoke to pt about MRI results per Dr Lucia GaskinsAhern note. She verbalized understanding and requested copy be sent to PCP.  Faxed copy of report to PCP as requested. Fax: [phone]. Received confirmation.
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
## 2016-02-10 ENCOUNTER — Ambulatory Visit (INDEPENDENT_AMBULATORY_CARE_PROVIDER_SITE_OTHER): Payer: 59 | Admitting: Psychology

## 2016-02-10 ENCOUNTER — Encounter: Payer: Self-pay | Admitting: Psychology

## 2016-02-10 DIAGNOSIS — F411 Generalized anxiety disorder: Secondary | ICD-10-CM

## 2016-02-10 DIAGNOSIS — F321 Major depressive disorder, single episode, moderate: Secondary | ICD-10-CM

## 2016-02-10 DIAGNOSIS — G3184 Mild cognitive impairment, so stated: Secondary | ICD-10-CM

## 2016-02-10 DIAGNOSIS — R413 Other amnesia: Secondary | ICD-10-CM

## 2016-02-25 ENCOUNTER — Ambulatory Visit (INDEPENDENT_AMBULATORY_CARE_PROVIDER_SITE_OTHER): Payer: Self-pay | Admitting: Psychology

## 2016-02-25 DIAGNOSIS — F321 Major depressive disorder, single episode, moderate: Secondary | ICD-10-CM

## 2016-02-25 DIAGNOSIS — F411 Generalized anxiety disorder: Secondary | ICD-10-CM

## 2016-03-10 ENCOUNTER — Ambulatory Visit (INDEPENDENT_AMBULATORY_CARE_PROVIDER_SITE_OTHER): Payer: Self-pay | Admitting: Psychology

## 2016-03-10 DIAGNOSIS — F411 Generalized anxiety disorder: Secondary | ICD-10-CM

## 2016-03-10 DIAGNOSIS — F320 Major depressive disorder, single episode, mild: Secondary | ICD-10-CM

## 2016-03-24 ENCOUNTER — Ambulatory Visit (INDEPENDENT_AMBULATORY_CARE_PROVIDER_SITE_OTHER): Payer: 59 | Admitting: Psychology

## 2016-03-24 DIAGNOSIS — F411 Generalized anxiety disorder: Secondary | ICD-10-CM | POA: Diagnosis not present

## 2016-03-24 DIAGNOSIS — F330 Major depressive disorder, recurrent, mild: Secondary | ICD-10-CM

## 2016-03-26 ENCOUNTER — Telehealth: Payer: Self-pay | Admitting: Internal Medicine

## 2016-03-26 ENCOUNTER — Other Ambulatory Visit: Payer: Self-pay | Admitting: Internal Medicine

## 2016-03-26 MED ORDER — THYROID 90 MG PO TABS: 90.0000 mg | ORAL_TABLET | Freq: Two times a day (BID) | ORAL | 2 refills | Status: DC

## 2016-03-26 NOTE — Telephone Encounter (Signed)
Katie Cook from [facility]  Stated they could not get the NATURE-THROID 195 MG tablet.   Could you send over a prescription for NP Thyroid. [phone]

## 2016-03-26 NOTE — Telephone Encounter (Signed)
Pt would like us to call in the NP thyroid asap she is out

## 2016-03-26 NOTE — Telephone Encounter (Signed)
Prescriptions submitted.  

## 2016-03-26 NOTE — Telephone Encounter (Signed)
See message and please advise if ok to refill. Thanks!  

## 2016-03-26 NOTE — Telephone Encounter (Signed)
Yes, no pb, we can change.

## 2016-04-23 ENCOUNTER — Ambulatory Visit (INDEPENDENT_AMBULATORY_CARE_PROVIDER_SITE_OTHER): Payer: 59 | Admitting: Internal Medicine

## 2016-04-23 ENCOUNTER — Other Ambulatory Visit: Payer: Self-pay

## 2016-04-23 VITALS — BP 128/88 | HR 83 | Ht 61.0 in | Wt 230.0 lb

## 2016-04-23 DIAGNOSIS — IMO0001 Reserved for inherently not codable concepts without codable children: Secondary | ICD-10-CM

## 2016-04-23 DIAGNOSIS — E1165 Type 2 diabetes mellitus with hyperglycemia: Secondary | ICD-10-CM | POA: Diagnosis not present

## 2016-04-23 DIAGNOSIS — E039 Hypothyroidism, unspecified: Secondary | ICD-10-CM

## 2016-04-23 DIAGNOSIS — Z794 Long term (current) use of insulin: Secondary | ICD-10-CM | POA: Diagnosis not present

## 2016-04-23 LAB — POCT GLYCOSYLATED HEMOGLOBIN (HGB A1C): Hemoglobin A1C: 8.2

## 2016-04-23 NOTE — Patient Instructions (Addendum)
Please stop at the lab.  Patient Instructions  Please continue:  - Basaglar 50 units in am. - Humalog U200: 20-25 units - before breakfast 30 units - before dinner  Please send me your log in 2 weeks.  Please continue NP 180 mg daily.  Take the thyroid hormone every day, with water, at least 30 minutes before breakfast, separated by at least 4 hours from: - acid reflux medications - calcium - iron - multivitamins  Please stop at the lab.  Please come back for a follow-up appointment in 3 months.

## 2016-04-23 NOTE — Progress Notes (Signed)
Patient ID: Katie Cook, female   DOB: 30-Jan-1956, 61 y.o.   MRN: [id number]  HPI: Katie Cook is a 61 y.o.-year-old female, returning for f/u for DM2, insulin-dependent, uncontrolled, without complications and postsurgical hypothyroidism (after she had compression sx with MNG - noncancerous). Last visit 3.5 mo ago.  She had a URI  Hypothyroidism: Reviewed hx: - she had MNG >> developed compression sxs - thyroidectomy in 2011-2012 - hypothyroidism >> Levothyroxine, then Armour 6 x later: 180 mg 4/7 days and 120 in 3/7 days >> b/c insurance coverage >> switched to Levothyroxine 200 mcg daily >> decreased gradually to 137 mcg.  She takes the NP (changed from NatureThroid) 130 >> 146 >> 162 >> 180 mg daily in am: - in am - - eats b'fast >30 min later - she takes Omeprazole prn later in the dayfasting - takes MVI in pm or evening  Last TSH:  Lab Results  Component Value Date   TSH 6.06 (H) 01/09/2016   TSH 4.90 (H) 09/17/2015   TSH 5.30 (H) 05/02/2015   TSH 16.65 (H) 03/05/2015   TSH 7.070 (H) 01/22/2015   FREET4 0.62 01/09/2016   FREET4 0.57 (L) 09/17/2015   FREET4 0.61 05/02/2015   FREET4 0.54 (L) 03/05/2015   FREET4 0.93 01/22/2015  04/2013: TSH 0.4 (at the [facility])  DM2: Patient has been diagnosed with GDM during pregnancy, then DM2 in 2005; she started insulin in 2008.   Last hemoglobin A1c was:  Lab Results  Component Value Date   HGBA1C 7.5 01/09/2016   HGBA1C 8.3 09/12/2015   HGBA1C 7.7 (H) 05/02/2015  ~8% 04/2013.  Pt is on a regimen of: - Lantus 50 units in am. [place] x2 mo >> not covered anymore.  - NovoLog: - 20 units - before breakfast - 25-30 units - before dinner Could not afford: Januvia 100 mg daily, Tradjenta 5 mg in am She was Invokana 100 mg - added 01/2014 >> initially stopped for diarrhea >> restarted 06/2014 >> but N/D >> restarted 10/2014 >> yeast inf >> stopped She stopped  Metformin XR 500 mg at lunchtime and 1000 mg at dinner  - started 09/2013 >> nausea, diarrhea. She stopped Victoza >> nausea, 3 years. She tried Actos. She tried regular Metformin >> nausea. She tried Prandin.  Pt checks her sugars 2-3x a day and they are better: - am: 16-171 >> 103-293, 330 >> 74-238 >> 120-142,160, 200 >> 94-278 - 2h after b'fast: 161-261 >> 232x1 >> 131-195 >> 167-254 >> n/c >> 114, 126-284 - before lunch: 154, 166 >> 133, 136 >> 134-259 >> 96, 184 >> 175 >> 120-258 - 2h after lunch:103 >> 52, 76, 108-242 >> 101-237, 491 >> 206 >> 188-210 - before dinner: 181-210 >> 104-140 >> 138, 198 >> 212-321 >> 283 >> 234 - 2h after dinner: 191-306 >> 329x1 >>155,  238 >> 236-274  >> 156 >> n/c - bedtime: 142, 143 >> 137-367, 471 >> 85 >> 180, 249, 262 >> 183, 336 - nighttime: n/c >> 228, 250 >> 84-300 >> 157-204 >> 74x1 >> 77, 176 >> 85, 100, 287 Has lows. Lowest sugar was 84 x1 >> 103 >> 52 >> 74 >> 85; she has hypoglycemia awareness at 70.  Highest sugar was 300s >> 400s >> 491 >> 336.  Meter: AccuChek Aviva.  Pt's meals are better: cut down portions, eating earlier in the day: - Breakfast: yoghurt + fruit - Lunch: salad with [place] - Dinner: chicken, broccoli, meat - Snacks: 2-3:  fruit, popcorn Seldom eats out.  Reviewed latest labs: - no CKD, last BUN/creatinine:  Lab Results  Component Value Date   BUN 16 04/09/2015   CREATININE 0.57 04/09/2015  03/19/2014: 17/0.75 On Losartan. - Has HL.  04/08/2015: 203/103/58/124 03/19/2014: 183/138/52/103 - last eye exam was in 02/2016. No DR. + Cataract. - no numbness and tingling in her feet.  I reviewed pt's medications, allergies, PMH, social hx, family hx, and changes were documented in the history of present illness. Otherwise, unchanged from my initial visit note.  ROS: Constitutional: + weight loss, + fatigue, + hot flushes, no nocturia Eyes: no blurry vision, no xerophthalmia ENT: no sore throat, no nodules palpated in throat, no  dysphagia/odynophagia Cardiovascular: no CP/SOB/+ palpitations/no leg swelling Respiratory: + cough/no SOB Gastrointestinal: no N/V/D/C/heartburn Musculoskeletal: + muscle aches/+  joint aches Skin: no rash, no hair loss  Neurological: no tremors/numbness/tingling/dizziness, + HA (sinus pressure)  PE: BP 128/88 (BP Location: Left Arm, Patient Position: Sitting)   Pulse 83   Ht _0  (1.549 m)   Wt 230 lb (104.3 kg)   SpO2 98%   BMI 43.46 kg/m  Body mass index is 43.46 kg/m.  Wt Readings from Last 3 Encounters:  04/23/16 230 lb (104.3 kg)  01/09/16 233 lb (105.7 kg)  02/03/16 233 lb (105.7 kg)   Constitutional: overweight, in NAD Eyes: PERRLA, EOMI, no exophthalmos ENT: moist mucous membranes, no thyromegaly, no cervical lymphadenopathy Cardiovascular: RRR, No MRG Respiratory: CTA B Gastrointestinal: abdomen soft, NT, ND, BS+ Musculoskeletal: no deformities, strength intact in all 4 Skin: moist, warm, no rashes Neurological: no tremor with outstretched hands, DTR normal in all 4  ASSESSMENT: 1. DM2, insulin-dependent, uncontrolled, without complications  2. Hypothyroidism - postsurgical - thyroidectomy 2011 - h/o benign MNG  3. Obesity  PLAN:  1. Patient with long-standing, uncontrolled diabetes, on basal-bolus insulin regimen. Sugars are very variable but overall worsened control due to the Holidays and her URI. Will not change regimen as sugars started to improve, but she will send me her log in 2 weeks and I will make adjustments then, if still needed - I suggested Invokana in the past, to also help with her high BP and also her obesity, but she did not start afraid of side effects.  - I advised her to:   Patient Instructions  Please continue:  - Basaglar 50 units in am. - Humalog U200: 20 - 25 units - with breakfast 30 units - with dinner  Please send me your log in 2 weeks.  - continue checking sugars at different times of the day - check 3 times a day,  rotating checks - Needs a new eye exam - refused flu shot - check CMP and Lipids - check Hba1c today >> 8.2% (higher) - Return to clinic in 3 mo with sugar log   2. Hypothyroidism - pt is taking the Naturethroid 162 mg  every day, with water, >30 minutes before breakfast, separated by >4 hours from acid reflux medications, calcium, iron, multivitamins. - Reviewed TFTs from 12/2015 >> TSH slightly high >> we increased the thyroid med dose to 180 mg Naturethroid. She does have some palpitations and slight weight loss. - will recheck TFTs today  Patient Instructions  Please continue NP 180 mg daily.  Take the thyroid hormone every day, with water, at least 30 minutes before breakfast, separated by at least 4 hours from: - acid reflux medications - calcium - iron - multivitamins  Please stop at the lab.  Please come back for a follow-up appointment in 3 months.  3. Obesity - lost 3 lbs  Needs a refill of NP  Component     Latest Ref Rng & Units 04/23/2016  Glucose     65 - 99 mg/dL 172 (H)  BUN     8 - 27 mg/dL 18  Creatinine     0.57 - 1.00 mg/dL 0.50 (L)  EGFR (Non-African Amer.)     >59 mL/min/1.73 106  EGFR (African American)     >59 mL/min/1.73 122  BUN/Creatinine Ratio     12 - 28 36 (H)  Sodium     134 - 144 mmol/L 140  Potassium     3.5 - 5.2 mmol/L 4.1  Chloride     96 - 106 mmol/L 100  CO2     18 - 29 mmol/L 23  Calcium     8.7 - 10.3 mg/dL 9.5  Total Protein     6.0 - 8.5 g/dL 7.4  Albumin     3.6 - 4.8 g/dL 4.4  Globulin, Total     1.5 - 4.5 g/dL 3.0  Albumin/Globulin Ratio     1.2 - 2.2 1.5  Total Bilirubin     0.0 - 1.2 mg/dL 0.3  Alkaline Phosphatase     39 - 117 IU/L 112  AST     0 - 40 IU/L 13  ALT     0 - 32 IU/L 20  Cholesterol, Total     100 - 199 mg/dL 174  Triglycerides     0 - 149 mg/dL 105  HDL Cholesterol     >39 mg/dL 53  VLDL Cholesterol Cal     5 - 40 mg/dL 21  LDL (calc)     0 - 99 mg/dL 100 (H)  Total CHOL/HDL  Ratio     0.0 - 4.4 ratio units 3.3  TSH     0.450 - 4.500 uIU/mL 0.047 (L)  T4,Free(Direct)     0.82 - 1.77 ng/dL 1.40  Hemoglobin A1C      8.2  Triiodothyronine,Free,Serum     2.0 - 4.4 pg/mL 3.1   TSH is suppressed. We'll need to back off the NP dose >> 1.5 tablets a day (135 mg).  Philemon Kingdom, MD PhD [facility] Endocrinology

## 2016-04-24 LAB — COMPREHENSIVE METABOLIC PANEL
ALT: 20 IU/L (ref 0–32)
AST: 13 IU/L (ref 0–40)
Albumin/Globulin Ratio: 1.5 (ref 1.2–2.2)
Albumin: 4.4 g/dL (ref 3.6–4.8)
Alkaline Phosphatase: 112 IU/L (ref 39–117)
BUN/Creatinine Ratio: 36 — ABNORMAL HIGH (ref 12–28)
BUN: 18 mg/dL (ref 8–27)
Bilirubin Total: 0.3 mg/dL (ref 0.0–1.2)
CO2: 23 mmol/L (ref 18–29)
Calcium: 9.5 mg/dL (ref 8.7–10.3)
Chloride: 100 mmol/L (ref 96–106)
Creatinine, Ser: 0.5 mg/dL — ABNORMAL LOW (ref 0.57–1.00)
GFR calc Af Amer: 122 mL/min/{1.73_m2} (ref >59)
GFR calc non Af Amer: 106 mL/min/{1.73_m2} (ref >59)
Globulin, Total: 3 g/dL (ref 1.5–4.5)
Glucose: 172 mg/dL — ABNORMAL HIGH (ref 65–99)
Potassium: 4.1 mmol/L (ref 3.5–5.2)
Sodium: 140 mmol/L (ref 134–144)
Total Protein: 7.4 g/dL (ref 6.0–8.5)

## 2016-04-24 LAB — LIPID PANEL
Chol/HDL Ratio: 3.3 ratio units (ref 0.0–4.4)
Cholesterol, Total: 174 mg/dL (ref 100–199)
HDL: 53 mg/dL (ref >39)
LDL Calculated: 100 mg/dL — ABNORMAL HIGH (ref 0–99)
Triglycerides: 105 mg/dL (ref 0–149)
VLDL Cholesterol Cal: 21 mg/dL (ref 5–40)

## 2016-04-24 LAB — T4, FREE: Free T4: 1.4 ng/dL (ref 0.82–1.77)

## 2016-04-24 LAB — TSH: TSH: 0.047 u[IU]/mL — ABNORMAL LOW (ref 0.450–4.500)

## 2016-04-24 LAB — T3, FREE: T3, Free: 3.1 pg/mL (ref 2.0–4.4)

## 2016-04-27 ENCOUNTER — Encounter: Payer: Self-pay | Admitting: Internal Medicine

## 2016-04-27 ENCOUNTER — Telehealth: Payer: Self-pay

## 2016-04-27 MED ORDER — LEVOTHYROXINE-LIOTHYRONINE 90 MG PO TABS: 135.0000 mg | ORAL_TABLET | Freq: Every day | ORAL | 1 refills | Status: DC

## 2016-04-27 NOTE — Telephone Encounter (Signed)
Left message regarding lab work. Gave call back number to discuss.

## 2016-04-30 ENCOUNTER — Telehealth: Payer: Self-pay | Admitting: Internal Medicine

## 2016-04-30 NOTE — Telephone Encounter (Signed)
Refill of [facility]  [facility] [place] - [id number] [phone] (Phone) [phone] (Fax)

## 2016-05-03 ENCOUNTER — Ambulatory Visit (INDEPENDENT_AMBULATORY_CARE_PROVIDER_SITE_OTHER): Payer: 59 | Admitting: Psychology

## 2016-05-03 DIAGNOSIS — F330 Major depressive disorder, recurrent, mild: Secondary | ICD-10-CM | POA: Diagnosis not present

## 2016-05-03 DIAGNOSIS — F411 Generalized anxiety disorder: Secondary | ICD-10-CM

## 2016-05-04 ENCOUNTER — Other Ambulatory Visit: Payer: Self-pay | Admitting: Internal Medicine

## 2016-05-04 ENCOUNTER — Other Ambulatory Visit: Payer: Self-pay

## 2016-05-04 MED ORDER — GLUCOSE BLOOD VI STRP: ORAL_STRIP | 1 refills | Status: DC

## 2016-05-04 NOTE — Telephone Encounter (Signed)
Ordered 05/04/16 

## 2016-05-19 ENCOUNTER — Telehealth: Payer: Self-pay

## 2016-05-19 NOTE — Telephone Encounter (Signed)
Please advise. Thank you

## 2016-05-19 NOTE — Telephone Encounter (Signed)
Patient is having surgery tomorrow  and would like to know how much insulin to takeInsulin   Lispro (HUMALOG KWIKPEN) 200 UNIT/ML SOPN Insulin Glargine (BASAGLAR KWIKPEN) 100 UNIT/ML SOPN  Please advise today

## 2016-05-19 NOTE — Telephone Encounter (Signed)
Called and LVM for patient advising of insulin changes. Spoke with Dr.Gherghe verbally, she states that she can take no insulin the day of the surgery, and if she able to eat afterward she can do a low dose of 20-25 units of humalog. Advised patient to not take her basaglar until the day after the surgery. Gave call back number if patient needed to discuss further.

## 2016-05-20 NOTE — Telephone Encounter (Signed)
Take long acting insulin the day before surgery and the day after, but skip it in the am of the surgery. No short acting insulin in the day of the surgery until she can eat. Even then, for the day of the surgery, she needs to sty with lower doses: 20-25 units per meal.

## 2016-05-20 NOTE — Telephone Encounter (Signed)
Called and LVM advising patient of this verbal discussion yesterday. 05/19/16

## 2016-05-28 ENCOUNTER — Other Ambulatory Visit (INDEPENDENT_AMBULATORY_CARE_PROVIDER_SITE_OTHER): Payer: 59

## 2016-05-28 DIAGNOSIS — E039 Hypothyroidism, unspecified: Secondary | ICD-10-CM | POA: Diagnosis not present

## 2016-05-28 LAB — T4, FREE: Free T4: 0.81 ng/dL (ref 0.60–1.60)

## 2016-05-28 LAB — T3, FREE: T3, Free: 4.9 pg/mL — ABNORMAL HIGH (ref 2.3–4.2)

## 2016-05-28 LAB — TSH: TSH: 0.29 u[IU]/mL — ABNORMAL LOW (ref 0.35–4.50)

## 2016-05-31 ENCOUNTER — Telehealth: Payer: Self-pay

## 2016-05-31 NOTE — Telephone Encounter (Signed)
Called and LVM for patient to return call to discuss lab work and medication changes, left call back number.

## 2016-05-31 NOTE — Telephone Encounter (Signed)
-----   Message from Carlus Pavlovristina Gherghe, MD sent at 05/31/2016  7:40 AM EST ----- Raynelle FanningJulie, can you please call pt: Thyroid tests have improved, that they are still a little bit abnormal. Please advise her to take 1.5 tablets of NP thyroid 5 out of 7 days, and in 2 out of 7 days only take 1 tablet. For example, take 1 tablet on Wednesdays and Sundays. I will recheck her tests when she comes back in April.

## 2016-05-31 NOTE — Telephone Encounter (Signed)
Pt is aware of Dr. Charlean SanfilippoGherghe's message.

## 2016-06-15 ENCOUNTER — Other Ambulatory Visit: Payer: Self-pay

## 2016-06-15 MED ORDER — BASAGLAR KWIKPEN 100 UNIT/ML ~~LOC~~ SOPN: 50.0000 [IU] | PEN_INJECTOR | Freq: Every day | SUBCUTANEOUS | 3 refills | Status: DC

## 2016-06-27 ENCOUNTER — Other Ambulatory Visit: Payer: Self-pay | Admitting: Internal Medicine

## 2016-07-20 ENCOUNTER — Other Ambulatory Visit (HOSPITAL_COMMUNITY): Payer: Self-pay | Admitting: General Surgery

## 2016-07-20 DIAGNOSIS — R1011 Right upper quadrant pain: Secondary | ICD-10-CM

## 2016-07-23 ENCOUNTER — Ambulatory Visit: Payer: Self-pay | Admitting: Internal Medicine

## 2016-07-28 ENCOUNTER — Encounter: Payer: Self-pay | Admitting: Internal Medicine

## 2016-07-28 ENCOUNTER — Ambulatory Visit (INDEPENDENT_AMBULATORY_CARE_PROVIDER_SITE_OTHER): Payer: 59 | Admitting: Internal Medicine

## 2016-07-28 VITALS — BP 138/74 | HR 79 | Temp 98.1°F | Resp 12 | Wt 231.8 lb

## 2016-07-28 DIAGNOSIS — IMO0001 Reserved for inherently not codable concepts without codable children: Secondary | ICD-10-CM

## 2016-07-28 DIAGNOSIS — E661 Drug-induced obesity: Secondary | ICD-10-CM | POA: Insufficient documentation

## 2016-07-28 DIAGNOSIS — E669 Obesity, unspecified: Secondary | ICD-10-CM | POA: Diagnosis not present

## 2016-07-28 DIAGNOSIS — E039 Hypothyroidism, unspecified: Secondary | ICD-10-CM | POA: Diagnosis not present

## 2016-07-28 DIAGNOSIS — E1165 Type 2 diabetes mellitus with hyperglycemia: Secondary | ICD-10-CM | POA: Diagnosis not present

## 2016-07-28 DIAGNOSIS — Z6841 Body Mass Index (BMI) 40.0 and over, adult: Secondary | ICD-10-CM

## 2016-07-28 DIAGNOSIS — Z794 Long term (current) use of insulin: Secondary | ICD-10-CM

## 2016-07-28 LAB — POCT GLYCOSYLATED HEMOGLOBIN (HGB A1C): Hemoglobin A1C: 8.6

## 2016-07-28 MED ORDER — DULAGLUTIDE 0.75 MG/0.5ML ~~LOC~~ SOAJ: SUBCUTANEOUS | 1 refills | Status: DC

## 2016-07-28 MED ORDER — INSULIN PEN NEEDLE 32G X 4 MM MISC: 2 refills | Status: DC

## 2016-07-28 NOTE — Patient Instructions (Addendum)
Please continue:  - Basaglar 50 units in am. - Humalog U200: 20-25 units - before breakfast 30 units - before dinner  Please start Trulicity 0.75 mg weekly. Let me know when you are close to running out to call in the higher dose to your pharmacy (1.5 mg).  Please continue NP 90 mg (1.5 tab 5/7 days and 1 tab 2/7 days).  Take the thyroid hormone every day, with water, at least 30 minutes before breakfast, separated by at least 4 hours from: - acid reflux medications - calcium - iron - multivitamins  Please stop at the lab.  Please come back for a follow-up appointment in 3 months.

## 2016-07-28 NOTE — Progress Notes (Signed)
Patient ID: Katie Cook, female   DOB: [birth year], 61 y.o.   MRN: [id number]  HPI: Katie Cook is a 61 y.o.-year-old female, returning for f/u for DM2, insulin-dependent, uncontrolled, without complications, on insulin since 2008 and postsurgical hypothyroidism (after she had compression sx with MNG - noncancerous). Last visit 3 mo ago.  She started a new diet 1 mo ago: salads, eating dinner earlier.  She had cataract sx. since last visit.  She has problems at work as she is making too many mistakes.  Hypothyroidism: Reviewed hx: - she had MNG >> developed compression sxs - thyroidectomy in 2011-2012 - hypothyroidism >> Levothyroxine, then Armour 6 x later: 180 mg 4/7 days and 120 in 3/7 days >> b/c insurance coverage >> switched to Levothyroxine 200 mcg daily >> decreased gradually to 137 mcg.  She takes the NP (changed from NatureThroid) 130 >> 146 >> 162 >> 180 >> 90 mg (1.5 tab 5/7 days and 1 tab 2/7 days): - in am, fasting - eats b'fast >30 min later - she takes Omeprazole as needed later in the day - takes MVI later in the day  Last TSH:  Lab Results  Component Value Date   TSH 0.29 (L) 05/28/2016   TSH 0.047 (L) 04/23/2016   TSH 6.06 (H) 01/09/2016   TSH 4.90 (H) 09/17/2015   TSH 5.30 (H) 05/02/2015   FREET4 0.81 05/28/2016   FREET4 1.40 04/23/2016   FREET4 0.62 01/09/2016   FREET4 0.57 (L) 09/17/2015   FREET4 0.61 05/02/2015  04/2013: TSH 0.4 (at the [facility])  DM2: - h/o GDM during pregnancy, then DM2 in 2005  Last hemoglobin A1c was:  Lab Results  Component Value Date   HGBA1C 8.2 04/23/2016   HGBA1C 7.5 01/09/2016   HGBA1C 8.3 09/12/2015  ~8% 04/2013.  Pt is on a regimen of: - Lantus 50 units in am. [place] x2 mo >> not covered anymore.  - Humalog: - 20 units - before breakfast - 25-30 units - before dinner - Prandin 2 mg before dinner - she restarted since last visit Could not afford: Januvia 100 mg daily, Tradjenta 5 mg in am She was  Invokana 100 mg - added 01/2014 >> initially stopped for diarrhea >> restarted 06/2014 >> but N/D >> restarted 10/2014 >> yeast inf >> stopped She stopped  Metformin XR 500 mg at lunchtime and 1000 mg at dinner - started 09/2013 >> nausea, diarrhea. She stopped Victoza >> nausea, 3 years. She tried Actos. She tried regular Metformin >> nausea. She tried Prandin.  Pt checks her sugars 2-3x a day and they are higher (defective Humalog pen) : - am: 16-171 >> 103-293, 330 >> 74-238 >> 120-142,160, 200 >> 94-278 >> 96, 127, 175, 216 - 2h after b'fast: 161-261 >> 232x1 >> 131-195 >> 167-254 >> n/c >> 114, 126-284 >> 221, 292 - before lunch: 154, 166 >> 133, 136 >> 134-259 >> 96, 184 >> 175 >> 120-258 >> 164, 182, 231, 314 - 2h after lunch:103 >> 52, 76, 108-242 >> 101-237, 491 >> 206 >> 188-210 >> 395 - before dinner: 181-210 >> 104-140 >> 138, 198 >> 212-321 >> 283 >> 234 >> 132, 183, 272, 298 - 2h after dinner: 191-306 >> 329x1 >>155,  238 >> 236-274  >> 156 >> n/c - bedtime: 142, 143 >> 137-367, 471 >> 85 >> 180, 249, 262 >> 183, 336 >> 99, 216, 316 - nighttime: n/c >> 228, 250 >> 84-300 >> 157-204 >> 74x1 >> 77, 176 >>  85, 100, 287 >> 103 Has lows. Lowest sugar was 84 x1 >> 103 >> 52 >> 74 >> 85 >> 99; she has hypoglycemia awareness at 70.  Highest sugar was 300s >> 400s >> 491 >> 336 >> 316.  Meter: AccuChek Aviva.  Pt's meals are better: cut down portions, eating earlier in the day: - Breakfast: yoghurt + fruit - Lunch: salad with [place] - Dinner: chicken, broccoli, meat - Snacks: 2-3: fruit, popcorn Seldom eats out.  Reviewed latest labs: - no CKD, last BUN/creatinine:  Lab Results  Component Value Date   BUN 18 04/23/2016   CREATININE 0.50 (L) 04/23/2016  03/19/2014: 17/0.75 On Losartan. - Has HL.  Lab Results  Component Value Date   CHOL 174 04/23/2016   HDL 53 04/23/2016   LDLCALC 100 (H) 04/23/2016   TRIG 105 04/23/2016   CHOLHDL 3.3 04/23/2016  04/08/2015:  203/103/58/124 03/19/2014: 183/138/52/103 - last eye exam was in 02/2016. No DR. + Cataract. - no numbness and tingling in her feet.  I reviewed pt's medications, allergies, PMH, social hx, family hx, and changes were documented in the history of present illness. Otherwise, unchanged from my initial visit note.  ROS: Constitutional: no weight loss or gain, + fatigue, + hot flushes, no nocturia Eyes: no blurry vision, no xerophthalmia ENT: no sore throat, no nodules palpated in throat, no dysphagia/odynophagia Cardiovascular: no CP/SOB/+ palpitations/+ leg swelling Respiratory: + cough/no SOB/+ wheezing Gastrointestinal: no N/V/D/C/heartburn Musculoskeletal: + muscle aches/no joint aches Skin: no rash, + hair loss  Neurological: no tremors/numbness/tingling/dizziness, + HA  PE: BP 138/74 (BP Location: Left Arm, Patient Position: Sitting, Cuff Size: Normal)   Pulse 79   Temp 98.1 F (36.7 C) (Oral)   Resp 12   Wt 231 lb 12.8 oz (105.1 kg)   SpO2 97%   BMI 43.80 kg/m  Body mass index is 43.8 kg/m.  Wt Readings from Last 3 Encounters:  07/28/16 231 lb 12.8 oz (105.1 kg)  04/23/16 230 lb (104.3 kg)  01/09/16 233 lb (105.7 kg)   Constitutional: overweight, in NAD Eyes: PERRLA, EOMI, no exophthalmos ENT: moist mucous membranes, no thyromegaly, no cervical lymphadenopathy Cardiovascular: RRR, No MRG Respiratory: CTA B Gastrointestinal: abdomen soft, NT, ND, BS+ Musculoskeletal: no deformities, strength intact in all 4 Skin: moist, warm, no rashes Neurological: no tremor with outstretched hands, DTR normal in all 4  ASSESSMENT: 1. DM2, insulin-dependent, uncontrolled, without complications  2. Hypothyroidism - postsurgical - thyroidectomy 2011 - h/o benign MNG  3. Obesity  PLAN:  1. Patient with long-standing, uncontrolled diabetes, on basal-bolus insulin regimen. Sugars are still very variable but overall higher, unclear if this is due to the change of insulin per  insurance formulary change (from NovoLog to Humalog) or from a defective Humalog Pen. She did start the new diet and I'm hoping that this will improve her sugars, but for now she needs extra help. I suggested to try to elicit, which is usually covered by her insurance. - I suggested Invokana in the past, to also help with her high BP and also her obesity, but she did not start afraid of side effects.  - I advised her to:  Patient Instructions  Please continue:  - Basaglar 50 units in am. - Humalog U200: 20-25 units - before breakfast 30 units - before dinner  Please start Trulicity 0.75 mg weekly. Let me know when you are close to running out to call in the higher dose to your pharmacy (1.5 mg).  Please come back  for a follow-up appointment in 3 months.  - continue checking sugars at different times of the day - check 3 times a day, rotating checks - Again advised her to schedule a new eye exam - refused flu shot - check Hba1c today >> 8.6% (higher) - Return to clinic in 3 mo with sugar log   2. Hypothyroidism - pt is taking the NP thyroid 135 mg 5/7 days and 90 mg 2/7 days. Take the medication correctly every day, with water, >30 minutes before breakfast, separated by >4 hours from acid reflux medications, calcium, iron, multivitamins. - Reviewed TFTs from last visit >> TSH suppressed >> we decreased the thyroid med dose to the above doses - will recheck TFTs today  3. Obesity - Did not lose weight yet on the new diet. I did advise her to continue though.  Carlus Pavlov, MD PhD [facility]

## 2016-07-29 LAB — T4, FREE: Free T4: 0.73 ng/dL (ref 0.60–1.60)

## 2016-07-29 LAB — T3, FREE: T3, Free: 3.8 pg/mL (ref 2.3–4.2)

## 2016-07-29 LAB — TSH: TSH: 1.24 u[IU]/mL (ref 0.35–4.50)

## 2016-08-01 IMAGING — US US ABDOMEN LIMITED
1 series · 14 of 25 positions shown · non-contrast
Comparison: Ultrasound 05/15/2010.

CLINICAL DATA: Back spasms.

EXAM:
US ABDOMEN LIMITED - RIGHT UPPER QUADRANT

[Series 1: us abdomen limited · 0.22mm/px · 14 of 41 slices shown]
[im 1/41]
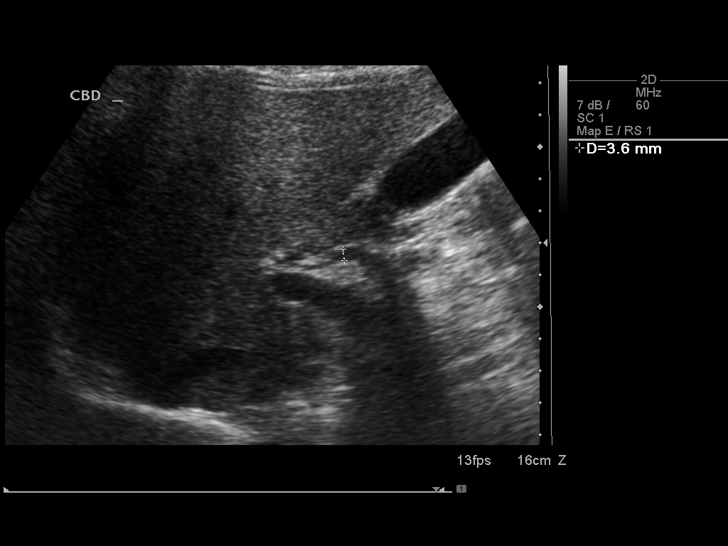
[im 4/41]
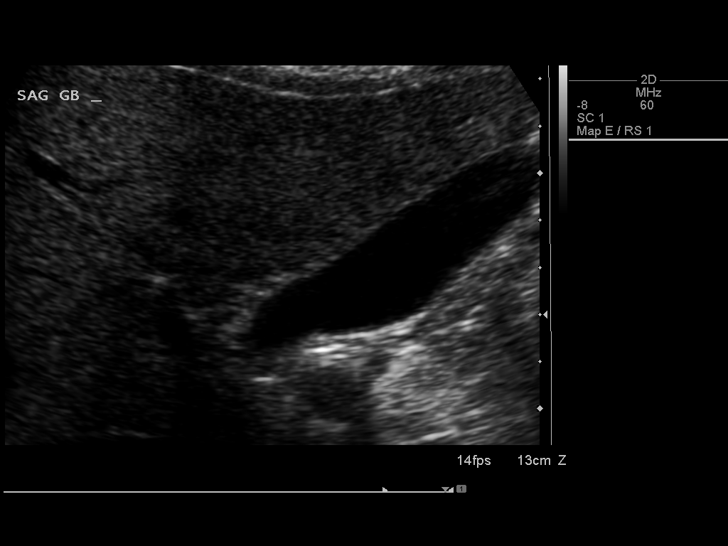
[im 7/41]
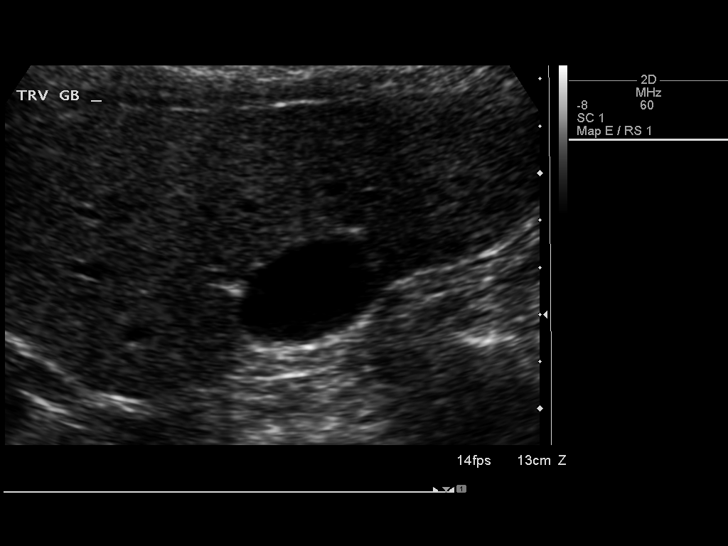
[im 11/41]
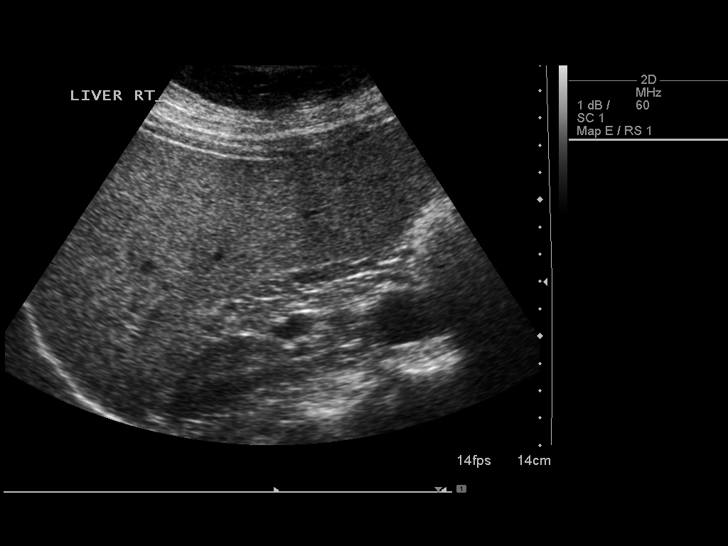
[im 14/41]
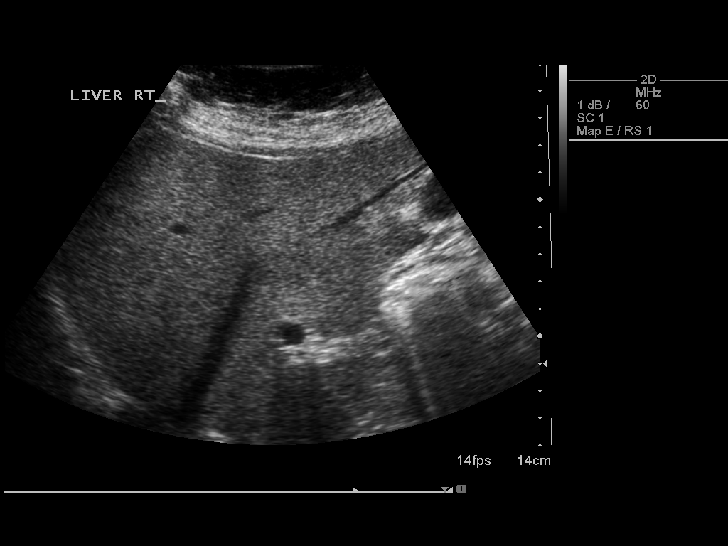
[im 16/41]
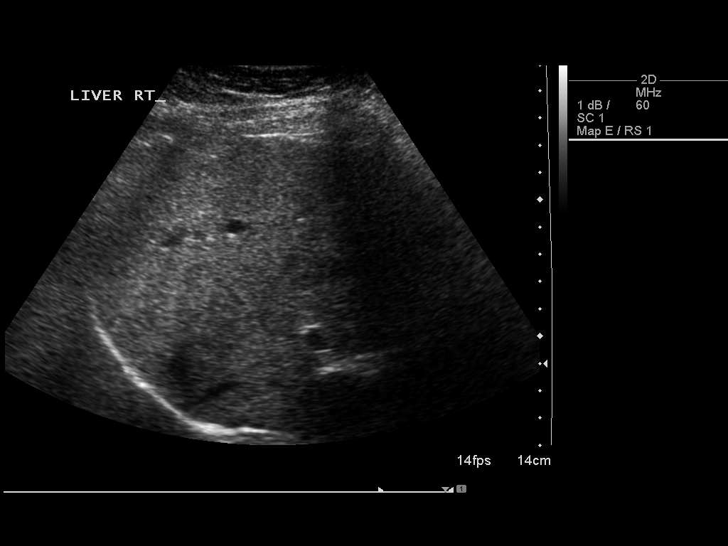
[im 19/41]
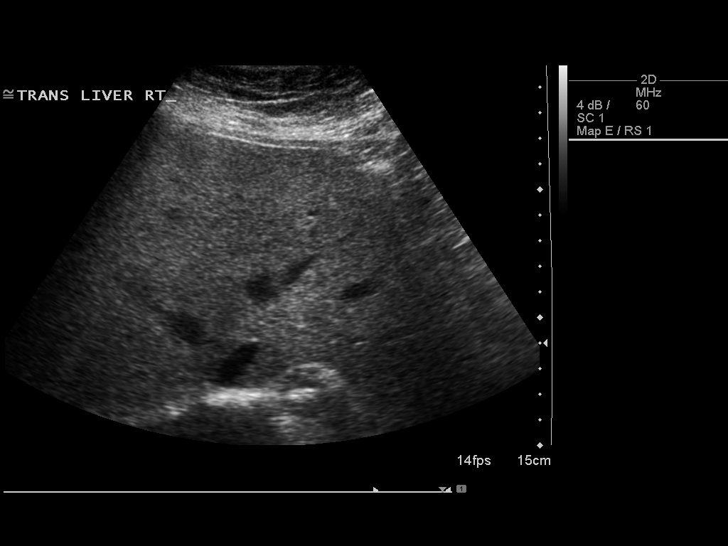
[im 22/41]
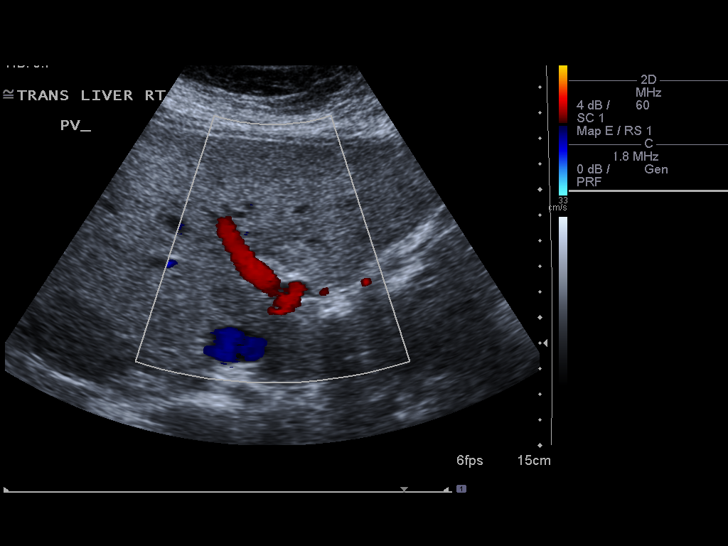
[im 26/41]
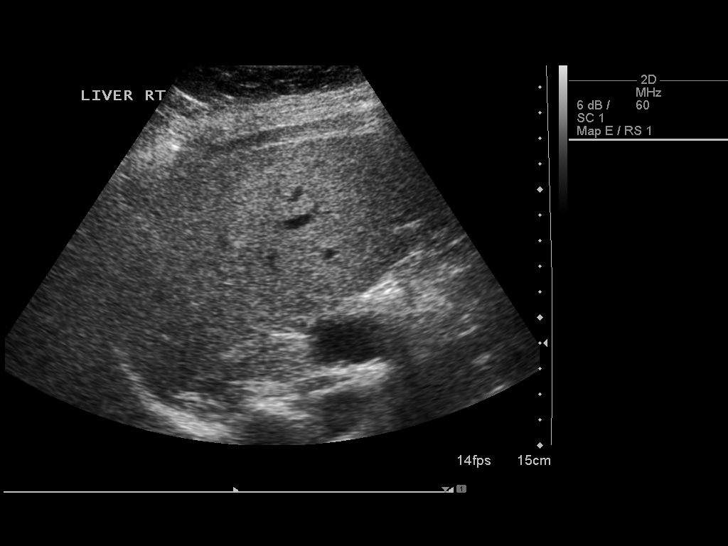
[im 27/41]
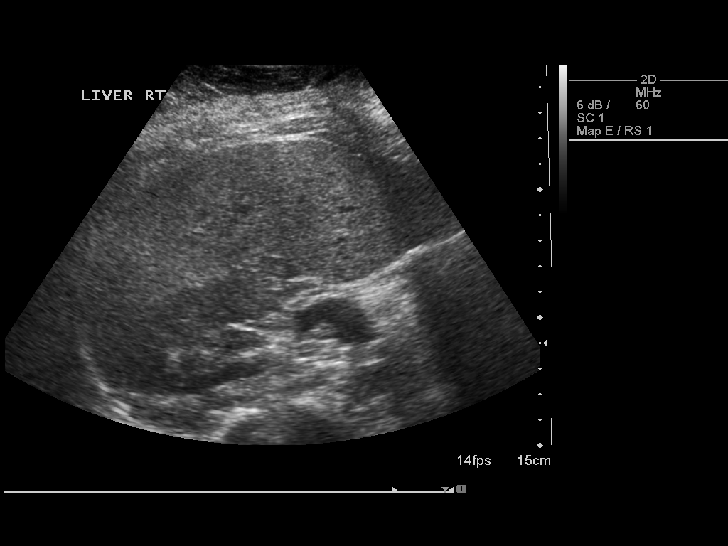
[im 31/41]
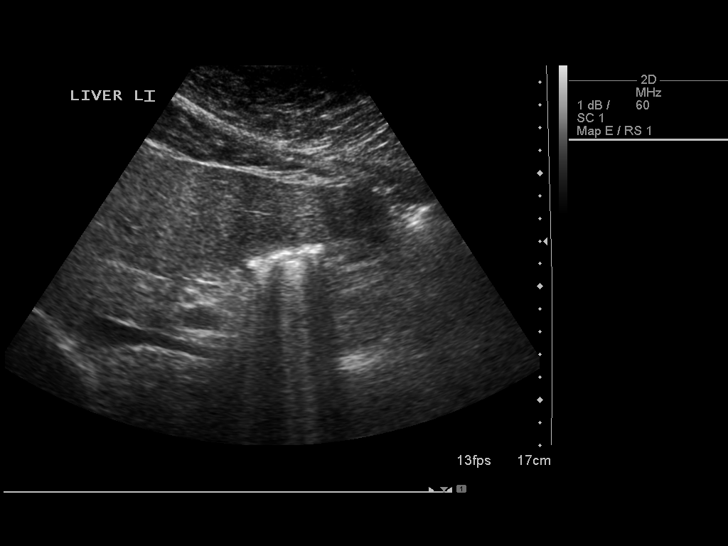
[im 34/41]
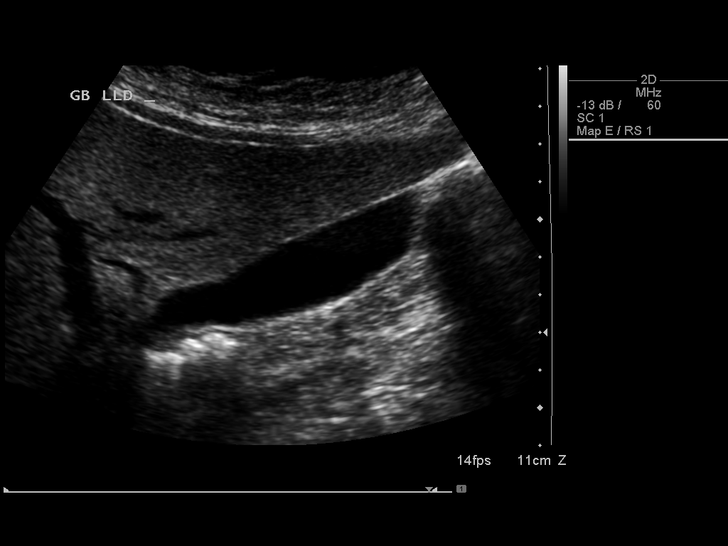
[im 37/41]
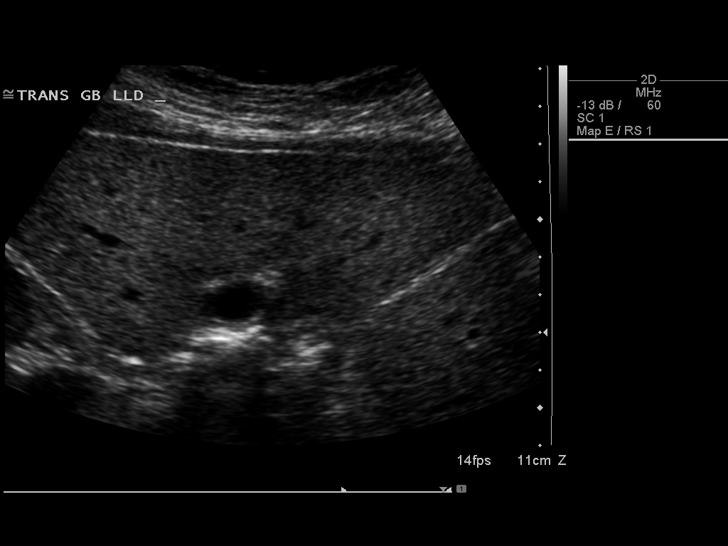
[im 41/41]
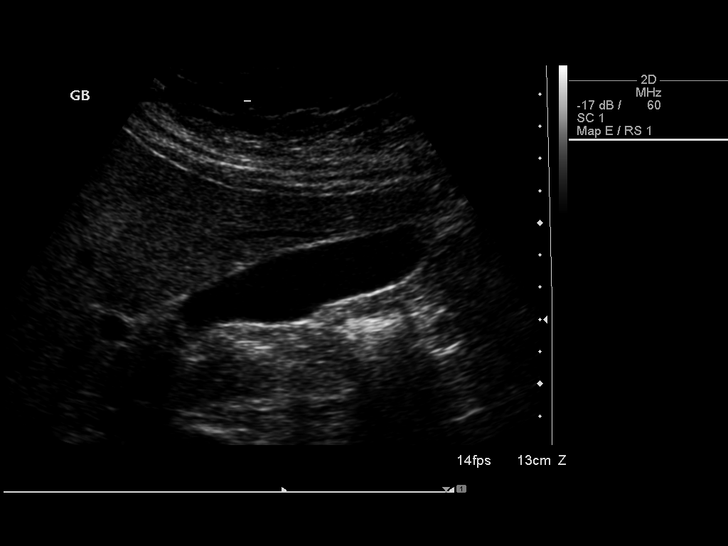

[14 of 25 positions shown; findings below may reference images not displayed]

FINDINGS: Gallbladder:

Liver is echogenic consistent fatty infiltration and/or
hepatocellular disease. No focal hepatic abnormality identified.

Common bile duct:

Diameter: 3.6 mm

Liver:

New liver is echogenic suggesting fatty infiltration and/or
hepatocellular disease. Similar finding noted on prior exam. No
focal hepatic abnormality identified.
IMPRESSION: 1. Echogenic liver consistent fatty infiltration and/or
hepatocellular disease. Similar findings noted on prior exam. No
focal hepatic abnormality.
2.  No acute abnormality.  No gallstones .

## 2016-08-02 ENCOUNTER — Telehealth: Payer: Self-pay

## 2016-08-02 NOTE — Telephone Encounter (Signed)
LVM, gave lab results. Gave call back number if any questions or concerns.  

## 2016-08-02 NOTE — Telephone Encounter (Signed)
-----   Message from Carlus Pavlov, MD sent at 07/29/2016 12:15 PM EDT ----- [name], can you please call pt: Katie Cook tests are perfect! Stay on the same dose of NP Katie Cook.

## 2016-08-18 ENCOUNTER — Ambulatory Visit (INDEPENDENT_AMBULATORY_CARE_PROVIDER_SITE_OTHER): Payer: 59 | Admitting: Psychology

## 2016-08-18 DIAGNOSIS — F341 Dysthymic disorder: Secondary | ICD-10-CM

## 2016-08-18 DIAGNOSIS — F411 Generalized anxiety disorder: Secondary | ICD-10-CM

## 2016-09-14 ENCOUNTER — Ambulatory Visit (HOSPITAL_COMMUNITY)
Admission: RE | Admit: 2016-09-14 | Discharge: 2016-09-14 | Disposition: A | Discharge status: Home / Self Care | Payer: 59 | Source: Ambulatory Visit | Attending: General Surgery | Admitting: General Surgery

## 2016-09-14 DIAGNOSIS — M4807 Spinal stenosis, lumbosacral region: Secondary | ICD-10-CM | POA: Diagnosis not present

## 2016-09-14 DIAGNOSIS — K760 Fatty (change of) liver, not elsewhere classified: Secondary | ICD-10-CM | POA: Insufficient documentation

## 2016-09-14 DIAGNOSIS — J9811 Atelectasis: Secondary | ICD-10-CM | POA: Diagnosis not present

## 2016-09-14 DIAGNOSIS — M48061 Spinal stenosis, lumbar region without neurogenic claudication: Secondary | ICD-10-CM | POA: Diagnosis not present

## 2016-09-14 DIAGNOSIS — N281 Cyst of kidney, acquired: Secondary | ICD-10-CM | POA: Insufficient documentation

## 2016-09-14 DIAGNOSIS — I2510 Atherosclerotic heart disease of native coronary artery without angina pectoris: Secondary | ICD-10-CM | POA: Insufficient documentation

## 2016-09-14 DIAGNOSIS — K439 Ventral hernia without obstruction or gangrene: Secondary | ICD-10-CM | POA: Diagnosis not present

## 2016-09-14 DIAGNOSIS — R1011 Right upper quadrant pain: Secondary | ICD-10-CM | POA: Insufficient documentation

## 2016-09-14 DIAGNOSIS — K5730 Diverticulosis of large intestine without perforation or abscess without bleeding: Secondary | ICD-10-CM | POA: Diagnosis not present

## 2016-09-14 LAB — POCT I-STAT CREATININE: Creatinine, Ser: 0.5 mg/dL (ref 0.44–1.00)

## 2016-09-14 MED ORDER — IOPAMIDOL (ISOVUE-300) INJECTION 61%
INTRAVENOUS | Status: AC
  Filled 2016-09-14: qty 100

## 2016-09-14 MED ORDER — IOPAMIDOL (ISOVUE-300) INJECTION 61%
100.0000 mL | Freq: Once | INTRAVENOUS | Status: AC | PRN
  Administered 2016-09-14: 100 mL via INTRAVENOUS

## 2016-09-16 ENCOUNTER — Telehealth: Payer: Self-pay | Admitting: *Deleted

## 2016-09-17 ENCOUNTER — Ambulatory Visit (HOSPITAL_COMMUNITY): Payer: 59

## 2016-09-22 NOTE — Progress Notes (Signed)
Please let patient know that Ct is negative for pancreatic mass.

## 2016-09-27 ENCOUNTER — Ambulatory Visit (HOSPITAL_COMMUNITY)
Admission: RE | Admit: 2016-09-27 | Discharge: 2016-09-27 | Disposition: A | Discharge status: Home / Self Care | Payer: 59 | Source: Ambulatory Visit | Attending: General Surgery | Admitting: General Surgery

## 2016-09-27 DIAGNOSIS — R1011 Right upper quadrant pain: Secondary | ICD-10-CM

## 2016-09-27 MED ORDER — TECHNETIUM TC 99M MEBROFENIN IV KIT
5.2000 | PACK | Freq: Once | INTRAVENOUS | Status: AC | PRN
  Administered 2016-09-27: 5.2 via INTRAVENOUS

## 2016-09-28 ENCOUNTER — Telehealth: Payer: Self-pay | Admitting: Family Medicine

## 2016-09-28 ENCOUNTER — Other Ambulatory Visit: Payer: Self-pay

## 2016-09-28 MED ORDER — INSULIN PEN NEEDLE 32G X 5 MM MISC: 5 refills | Status: DC

## 2016-09-28 NOTE — Telephone Encounter (Signed)
**  Remind patient they can make refill requests via MyChart**  Medication refill request (Name & Dosage):  Insulin Pen Needle 32G X 4 MM MISC  Preferred pharmacy (Name & Address):  [facility] [phone] - [place], [place] - [address] [facility] [address] [facility] [address]  Other comments (if applicable):   Completely out of needs and would like 32G x 5 or 6 and not 4 b/c it's too short.

## 2016-09-28 NOTE — Telephone Encounter (Signed)
Submitted with 5mm needle.

## 2016-10-01 ENCOUNTER — Other Ambulatory Visit: Payer: Self-pay | Admitting: Internal Medicine

## 2016-10-09 ENCOUNTER — Other Ambulatory Visit: Payer: Self-pay | Admitting: Internal Medicine

## 2016-10-25 ENCOUNTER — Other Ambulatory Visit: Payer: Self-pay | Admitting: Internal Medicine

## 2016-11-11 ENCOUNTER — Other Ambulatory Visit: Payer: Self-pay | Admitting: Endocrinology

## 2016-11-26 ENCOUNTER — Ambulatory Visit (INDEPENDENT_AMBULATORY_CARE_PROVIDER_SITE_OTHER): Payer: 59 | Admitting: Psychology

## 2016-11-26 DIAGNOSIS — F341 Dysthymic disorder: Secondary | ICD-10-CM

## 2016-11-26 DIAGNOSIS — F411 Generalized anxiety disorder: Secondary | ICD-10-CM

## 2016-12-20 ENCOUNTER — Other Ambulatory Visit: Payer: Self-pay | Admitting: Internal Medicine

## 2016-12-23 ENCOUNTER — Ambulatory Visit: Payer: Self-pay | Admitting: Psychology

## 2016-12-30 ENCOUNTER — Other Ambulatory Visit (INDEPENDENT_AMBULATORY_CARE_PROVIDER_SITE_OTHER): Payer: 59

## 2016-12-30 DIAGNOSIS — E1165 Type 2 diabetes mellitus with hyperglycemia: Secondary | ICD-10-CM

## 2016-12-30 DIAGNOSIS — Z794 Long term (current) use of insulin: Secondary | ICD-10-CM

## 2016-12-30 DIAGNOSIS — E039 Hypothyroidism, unspecified: Secondary | ICD-10-CM

## 2016-12-30 DIAGNOSIS — IMO0001 Reserved for inherently not codable concepts without codable children: Secondary | ICD-10-CM

## 2016-12-30 LAB — COMPLETE METABOLIC PANEL WITH GFR
AG Ratio: 1.6 (calc) (ref 1.0–2.5)
ALT: 21 U/L (ref 6–29)
AST: 17 U/L (ref 10–35)
Albumin: 4.3 g/dL (ref 3.6–5.1)
Alkaline phosphatase (APISO): 100 U/L (ref 33–130)
BUN: 12 mg/dL (ref 7–25)
CO2: 31 mmol/L (ref 20–32)
Calcium: 9.6 mg/dL (ref 8.6–10.4)
Chloride: 100 mmol/L (ref 98–110)
Creat: 0.58 mg/dL (ref 0.50–0.99)
GFR, Est African American: 115 mL/min/{1.73_m2} (ref >=60)
GFR, Est Non African American: 99 mL/min/{1.73_m2} (ref >=60)
Globulin: 2.7 g/dL (calc) (ref 1.9–3.7)
Glucose, Bld: 86 mg/dL (ref 65–99)
Potassium: 4 mmol/L (ref 3.5–5.3)
Sodium: 138 mmol/L (ref 135–146)
Total Bilirubin: 0.4 mg/dL (ref 0.2–1.2)
Total Protein: 7 g/dL (ref 6.1–8.1)

## 2016-12-30 LAB — LIPID PANEL
Cholesterol: 175 mg/dL (ref 0–200)
HDL: 66.5 mg/dL (ref >39.00)
LDL Cholesterol: 92 mg/dL (ref 0–99)
NonHDL: 108.56
Total CHOL/HDL Ratio: 3
Triglycerides: 83 mg/dL (ref 0.0–149.0)
VLDL: 16.6 mg/dL (ref 0.0–40.0)

## 2016-12-30 LAB — TSH: TSH: 5.89 u[IU]/mL — ABNORMAL HIGH (ref 0.35–4.50)

## 2016-12-30 LAB — T3, FREE: T3, Free: 3.5 pg/mL (ref 2.3–4.2)

## 2016-12-30 LAB — T4, FREE: Free T4: 0.71 ng/dL (ref 0.60–1.60)

## 2016-12-31 ENCOUNTER — Other Ambulatory Visit: Payer: Self-pay

## 2017-01-03 ENCOUNTER — Encounter: Payer: 59 | Attending: Family Medicine | Admitting: Dietician

## 2017-01-03 ENCOUNTER — Encounter: Payer: Self-pay | Admitting: Dietician

## 2017-01-03 DIAGNOSIS — Z881 Allergy status to other antibiotic agents status: Secondary | ICD-10-CM | POA: Diagnosis not present

## 2017-01-03 DIAGNOSIS — Z6841 Body Mass Index (BMI) 40.0 and over, adult: Secondary | ICD-10-CM | POA: Diagnosis not present

## 2017-01-03 DIAGNOSIS — Z882 Allergy status to sulfonamides status: Secondary | ICD-10-CM | POA: Diagnosis not present

## 2017-01-03 DIAGNOSIS — Z794 Long term (current) use of insulin: Secondary | ICD-10-CM

## 2017-01-03 DIAGNOSIS — E1165 Type 2 diabetes mellitus with hyperglycemia: Secondary | ICD-10-CM

## 2017-01-03 DIAGNOSIS — Z800 Family history of malignant neoplasm of digestive organs: Secondary | ICD-10-CM | POA: Diagnosis not present

## 2017-01-03 DIAGNOSIS — Z713 Dietary counseling and surveillance: Secondary | ICD-10-CM | POA: Insufficient documentation

## 2017-01-03 DIAGNOSIS — E669 Obesity, unspecified: Secondary | ICD-10-CM | POA: Insufficient documentation

## 2017-01-03 DIAGNOSIS — IMO0001 Reserved for inherently not codable concepts without codable children: Secondary | ICD-10-CM

## 2017-01-03 NOTE — Patient Instructions (Signed)
Aim to be active most days. 30 minutes walking or armchair exercises. Try to plan your lunch   Salad with protein, fruit  hummus with raw vegetables, crackers, fruit   Small amount of protein with your fruit Be mindful of your rice portion.  Aim for 2-3 Carb Choices per meal (30-45 grams) +/- 1 either way  Aim for 0-1 Carbs per snack if hungry  Include protein in moderation with your meals and snacks Consider reading food labels for Total Carbohydrate and Fat Grams of foods Consider checking BG at alternate times per day as directed by MD  Continue taking medication as directed by MD

## 2017-01-04 NOTE — Progress Notes (Signed)
Diabetes Self-Management Education  Visit Type: First/Initial  Appt. Start Time: 1550 Appt. End Time: 1655  01/04/2017  Katie Cook, identified by name and date of birth, is a 61 y.o. female with a diagnosis of Diabetes: Type 2.  Other hx includes HNT, hypothyroidism (after thyroid removal), GDM, and fatty liver.  She states that she is very frustrated and depressed about her weight.  She states that she has been trying to eat a healthy diet but has been unable to lose weight.  Her daughter is currently in town and they eat healthy but patient does not eat lunch often and breakfast is often rushed.  She does not eat out.  She is a patient of Dr. Elvera Lennox.   Medications include Humalog 25-35 units bid with meals and basalar 50 units each am.  ASSESSMENT  Height  (1.549 m), weight 234 lb (106.1 kg). Body mass index is 44.21 kg/m.      Diabetes Self-Management Education - 01/03/17 1618      Visit Information   Visit Type First/Initial     Initial Visit   Diabetes Type Type 2   Are you currently following a meal plan? Yes   What type of meal plan do you follow? less processed, low fat, less carbs   Are you taking your medications as prescribed? Yes   Date Diagnosed 2005  Insulin since 2008     Health Coping   How would you rate your overall health? Fair     Psychosocial Assessment   Patient Belief/Attitude about Diabetes Motivated to manage diabetes   Self-care barriers None   Self-management support Family;Doctor's office   Other persons present Patient;Family Member  daughter   Patient Concerns Nutrition/Meal planning;Weight Control;Glycemic Control   Special Needs None   Preferred Learning Style No preference indicated   Learning Readiness Ready   How often do you need to have someone help you when you read instructions, pamphlets, or other written materials from your doctor or pharmacy? 1 - Never   What is the last grade level you completed in school? 2  years college     Pre-Education Assessment   Patient understands the diabetes disease and treatment process. Demonstrates understanding / competency   Patient understands incorporating nutritional management into lifestyle. Needs Review   Patient undertands incorporating physical activity into lifestyle. Needs Review   Patient understands using medications safely. Needs Review   Patient understands monitoring blood glucose, interpreting and using results Needs Review   Patient understands prevention, detection, and treatment of acute complications. Needs Review   Patient understands prevention, detection, and treatment of chronic complications. Needs Review   Patient understands how to develop strategies to address psychosocial issues. Needs Review   Patient understands how to develop strategies to promote health/change behavior. Needs Review     Complications   Last HgB A1C per patient/outside source 8.6 %  07/28/16   How often do you check your blood sugar? 3-4 times/day   Fasting Blood glucose range (mg/dL) >161   Postprandial Blood glucose range (mg/dL) 096-045;409-811   Number of hypoglycemic episodes per month 1   Can you tell when your blood sugar is low? Yes   What do you do if your blood sugar is low? drinks regular soda or juice   Number of hyperglycemic episodes per week 7   Can you tell when your blood sugar is high? Yes   What do you do if your blood sugar is high? takes more Humalog and  drinks water     Dietary Intake   Breakfast 2 boiled eggs, cheese, spinach and occasional [employer] toast with butter OR yogurt OR regular oatmeal, 1 T honey, berries or 1/2 banana, cinnamon   Snack (morning) orange   Lunch yogurt, fruit OR salad   Snack (afternoon) orange   Dinner soup or stew with crackers OR salad with meat OR meat, salad or vegetable and rice or potato  5:30-6   Snack (evening) watermelon   Beverage(s) water, herb tea with 1 tsp honey     Exercise   Exercise Type Light  (walking / raking leaves)   How many days per week to you exercise? 3   How many minutes per day do you exercise? 30   Total minutes per week of exercise 90     Patient Education   Previous Diabetes Education Yes (please comment)   Disease state  Definition of diabetes, type 1 and 2, and the diagnosis of diabetes   Nutrition management  Role of diet in the treatment of diabetes and the relationship between the three main macronutrients and blood glucose level;Food label reading, portion sizes and measuring food.;Meal options for control of blood glucose level and chronic complications.;Meal timing in regards to the patients' current diabetes medication.   Physical activity and exercise  Role of exercise on diabetes management, blood pressure control and cardiac health.;Helped patient identify appropriate exercises in relation to his/her diabetes, diabetes complications and other health issue.   Medications Reviewed patients medication for diabetes, action, purpose, timing of dose and side effects.   Monitoring Purpose and frequency of SMBG.;Daily foot exams;Yearly dilated eye exam   Acute complications Taught treatment of hypoglycemia - the 15 rule.;Discussed and identified patients' treatment of hyperglycemia.   Chronic complications Relationship between chronic complications and blood glucose control;Retinopathy and reason for yearly dilated eye exams   Psychosocial adjustment Worked with patient to identify barriers to care and solutions;Role of stress on diabetes;Identified and addressed patients feelings and concerns about diabetes   Personal strategies to promote health Lifestyle issues that need to be addressed for better diabetes care     Individualized Goals (developed by patient)   Nutrition General guidelines for healthy choices and portions discussed   Physical Activity Exercise 5-7 days per week;30 minutes per day   Medications take my medication as prescribed   Monitoring  test my  blood glucose as discussed   Problem Solving balanced meals   Reducing Risk examine blood glucose patterns   Health Coping discuss diabetes with (comment)  MD/RD/family     Post-Education Assessment   Patient understands the diabetes disease and treatment process. Demonstrates understanding / competency   Patient understands incorporating nutritional management into lifestyle. Demonstrates understanding / competency   Patient undertands incorporating physical activity into lifestyle. Demonstrates understanding / competency   Patient understands using medications safely. Demonstrates understanding / competency   Patient understands monitoring blood glucose, interpreting and using results Demonstrates understanding / competency   Patient understands prevention, detection, and treatment of acute complications. Demonstrates understanding / competency   Patient understands prevention, detection, and treatment of chronic complications. Demonstrates understanding / competency   Patient understands how to develop strategies to address psychosocial issues. Demonstrates understanding / competency   Patient understands how to develop strategies to promote health/change behavior. Demonstrates understanding / competency     Outcomes   Expected Outcomes Demonstrated interest in learning. Expect positive outcomes   Future DMSE PRN   Program Status Completed  Individualized Plan for Diabetes Self-Management Training:   Learning Objective:  Patient will have a greater understanding of diabetes self-management. Patient education plan is to attend individual and/or group sessions per assessed needs and concerns.   Plan:   Patient Instructions  Aim to be active most days. 30 minutes walking or armchair exercises. Try to plan your lunch   Salad with protein, fruit  hummus with raw vegetables, crackers, fruit   Small amount of protein with your fruit Be mindful of your rice portion.  Aim for  2-3 Carb Choices per meal (30-45 grams) +/- 1 either way  Aim for 0-1 Carbs per snack if hungry  Include protein in moderation with your meals and snacks Consider reading food labels for Total Carbohydrate and Fat Grams of foods Consider checking BG at alternate times per day as directed by MD  Continue taking medication as directed by MD      Expected Outcomes:  Demonstrated interest in learning. Expect positive outcomes  Education material provided: Living Well with Diabetes, Food label handouts, A1C conversion sheet, Meal plan card, My Plate and Snack sheet  If problems or questions, patient to contact team via:  Phone  Future DSME appointment: PRN

## 2017-01-06 ENCOUNTER — Encounter: Payer: Self-pay | Admitting: Internal Medicine

## 2017-01-06 ENCOUNTER — Ambulatory Visit (INDEPENDENT_AMBULATORY_CARE_PROVIDER_SITE_OTHER): Payer: 59 | Admitting: Internal Medicine

## 2017-01-06 VITALS — BP 130/82 | HR 66 | Ht 61.0 in | Wt 237.0 lb

## 2017-01-06 DIAGNOSIS — Z794 Long term (current) use of insulin: Secondary | ICD-10-CM

## 2017-01-06 DIAGNOSIS — E039 Hypothyroidism, unspecified: Secondary | ICD-10-CM

## 2017-01-06 DIAGNOSIS — E1165 Type 2 diabetes mellitus with hyperglycemia: Secondary | ICD-10-CM

## 2017-01-06 DIAGNOSIS — Z6841 Body Mass Index (BMI) 40.0 and over, adult: Secondary | ICD-10-CM

## 2017-01-06 DIAGNOSIS — IMO0001 Reserved for inherently not codable concepts without codable children: Secondary | ICD-10-CM

## 2017-01-06 DIAGNOSIS — E669 Obesity, unspecified: Secondary | ICD-10-CM | POA: Diagnosis not present

## 2017-01-06 LAB — POCT GLYCOSYLATED HEMOGLOBIN (HGB A1C): Hemoglobin A1C: 8.9

## 2017-01-06 MED ORDER — INSULIN DEGLUDEC 200 UNIT/ML ~~LOC~~ SOPN: 50.0000 [IU] | PEN_INJECTOR | Freq: Every day | SUBCUTANEOUS | 5 refills | Status: DC

## 2017-01-06 MED ORDER — DULAGLUTIDE 0.75 MG/0.5ML ~~LOC~~ SOAJ: SUBCUTANEOUS | 1 refills | Status: DC

## 2017-01-06 NOTE — Progress Notes (Signed)
Patient ID: Katie Cook, female   DOB: [birth year], 61 y.o.   MRN: [id number]  HPI: Katie Cook is a 61 y.o.-year-old female, returning for f/u for DM2, insulin-dependent, uncontrolled, without complications, on insulin since 2008 and postsurgical hypothyroidism (after she had compression sx with MNG - noncancerous). Last visit 5 mo ago.  Hypothyroidism: Reviewed hx: - she had MNG >> developed compression sxs - thyroidectomy in 2011-2012 - hypothyroidism >> Levothyroxine, then Armour 6 x later: 180 mg 4/7 days and 120 in 3/7 days >> b/c insurance coverage >> switched to Levothyroxine 200 mcg daily >> decreased gradually to 137 mcg.  She takes the NP (changed from NatureThroid) 130 >> 146 >> 162 >> 180 >> 90 mg (1.5 tab 5/7 days and 1 tab 2/7 days): - in am - fasting - at least 30 min from b'fast - no Ca, Fe - + MVI, PPIs - later in the day - not on Biotin  Her TFTs have been fluctuating:  Lab Results  Component Value Date   TSH 5.89 (H) 12/30/2016   TSH 1.24 07/28/2016   TSH 0.29 (L) 05/28/2016   TSH 0.047 (L) 04/23/2016   TSH 6.06 (H) 01/09/2016   FREET4 0.71 12/30/2016   FREET4 0.73 07/28/2016   FREET4 0.81 05/28/2016   FREET4 1.40 04/23/2016   FREET4 0.62 01/09/2016  04/2013: TSH 0.4 (at the [facility])  DM2: - h/o GDM during pregnancy, then DM2 in 2005  Last hemoglobin A1c was:  Lab Results  Component Value Date   HGBA1C 8.6 07/28/2016   HGBA1C 8.2 04/23/2016   HGBA1C 7.5 01/09/2016  ~8% 04/2013.  Pt is on a regimen of: - Lantus 50 units in am. [place] Poli x2 mo >> not covered anymore. We switched to Basaglar 50 units daily in am. - Humalog 20-27 units 2x a day - may skip  She did not start Trulicity 0.75 mg weekly >> recommended 07/2016  Could not afford: Januvia 100 mg daily, Tradjenta 5 mg in am She was Invokana 100 mg - added 01/2014 >> initially stopped for diarrhea >> restarted 06/2014 >> but N/D >> restarted 10/2014 >> yeast inf >> stopped She  stopped  Metformin XR 500 mg at lunchtime and 1000 mg at dinner - started 09/2013 >> nausea, diarrhea. She stopped Victoza >> nausea, 3 years. She tried Actos. She tried regular Metformin >> nausea. She tried Prandin.  Pt checks her sugars 2-3x a day >> higher: - am:  94-278 >> 96, 127, 175, 216 >> 170-180 - 2h after b'fast: 114, 126-284 >> 221, 292 >> n/c - before lunch: 120-258 >> 164, 182, 231, 314 >> n/c - 2h after lunch: 206 >> 188-210 >> 395 >> 140-150s - before dinner:  234 >> 132, 183, 272, 298 >> n/c - 2h after dinner: 236-274  >> 156 >> n/c >> 170-250s - bedtime: 183, 336 >> 99, 216, 316 low 200s - nighttime:  77, 176 >> 85, 100, 287 >> 103 >> n/c Lowest sugar was  99 >> 60s ; she has hypoglycemia awareness at 70s   Highest sugar was 316 >> 300 x1.   Meter: AccuChek Aviva.  Pt's meals are better: cut down portions, eating earlier in the day: - Breakfast: yoghurt + fruit - Lunch: salad with [place] - Dinner: chicken, broccoli, meat - Snacks: 2-3: fruit, popcorn  Reviewed latest labs: - No CKD, last BUN/creatinine:  Lab Results  Component Value Date   BUN 12 12/30/2016   CREATININE 0.58 12/30/2016  03/19/2014:  17/0.75 On Losartan. - Has HL.  Lab Results  Component Value Date   CHOL 175 12/30/2016   HDL 66.50 12/30/2016   LDLCALC 92 12/30/2016   TRIG 83.0 12/30/2016   CHOLHDL 3 12/30/2016  04/08/2015: 203/103/58/124 03/19/2014: 183/138/52/103 - last eye exam was in 02/2016 >> No DR. + Cataract. - denies numbness and tingling in her feet.  ROS: Constitutional: no weight gain/no weight loss, + fatigue, no subjective hyperthermia, no subjective hypothermia Eyes: no blurry vision, no xerophthalmia ENT: no sore throat, no nodules palpated in throat, no dysphagia, no odynophagia, + hoarseness Cardiovascular: no CP/no SOB/no palpitations/no leg swelling Respiratory: no cough/no SOB/no wheezing Gastrointestinal: + N/no V/no D/no C/no acid reflux Musculoskeletal: +  muscle aches/+joint aches Skin: no rashes, no hair loss Neurological: no tremors/no numbness/no tingling/no dizziness  I reviewed pt's medications, allergies, PMH, social hx, family hx, and changes were documented in the history of present illness. Otherwise, unchanged from my initial visit note.  PE: BP 130/82 (BP Location: Left Arm, Patient Position: Sitting)   Pulse 66   Ht  (1.549 m)   Wt 237 lb (107.5 kg)   SpO2 97%   BMI 44.78 kg/m  Body mass index is 44.78 kg/m.  Wt Readings from Last 3 Encounters:  01/06/17 237 lb (107.5 kg)  01/03/17 234 lb (106.1 kg)  07/28/16 231 lb 12.8 oz (105.1 kg)   Constitutional: obese, in NAD Eyes: PERRLA, EOMI, no exophthalmos ENT: moist mucous membranes, no thyromegaly, no cervical lymphadenopathy Cardiovascular: RRR, No MRG Respiratory: CTA B Gastrointestinal: abdomen soft, NT, ND, BS+ Musculoskeletal: no deformities, strength intact in all 4 Skin: moist, warm, no rashes Neurological: no tremor with outstretched hands, DTR normal in all 4  ASSESSMENT: 1. DM2, insulin-dependent, uncontrolled, without complications  2. Hypothyroidism - postsurgical - thyroidectomy 2011 - h/o benign MNG  3. Obesity  PLAN:  1. Patient with long-standing, uncontrolled diabetes, on basal-bolus insulin regimen. Sugars are overall higher. She stopped Prandin and did not add Trulicity. - I suggested Invokana in the past, to also help with her high BP and also her obesity, but she did not start afraid of side effects.  - I suggested Trulicity at last visit >> did not start b/c high cost >> we discussed to try a coupon card. Discussed that this will also help her with weight loss. - she thinks Basaglar not helping much >> will try to see if [place] covered. This would be preferred also b/c of her tendency for low CBGs, which Evaristo Bury may reduce. - I advised her to:  Patient Instructions  Try to switch from:  - Basaglar to [place] U200 50 units  day.  Please continue Humalog 20-25 units 2x a day  Please start Trulicity 0.75 mg weekly. Let me know when you are close to running out to call in the higher dose to your pharmacy (1.5 mg).  Please return in 3 months with your sugar log.   - today, HbA1c is 8.9% (higher) - continue checking sugars at different times of the day - check 3x a day, rotating checks - advised for yearly eye exams >> she is UTD - Return to clinic in 3 mo with sugar log   2. Hypothyroidism - on NP thyroid 135 mg 5/7 days and 90 mg 2/7 days.  - latest thyroid labs reviewed with pt >> slightly high  - because of the previous fluctuations >> will keep thyroid hh dose stable for now >> will recheck at next visit -  we discussed about taking the thyroid hormone every day, with water, >30 minutes before breakfast, separated by >4 hours from acid reflux medications, calcium, iron, multivitamins. Pt. is taking it correctly  3. Obesity - gained 6 lbs since last visit.Marland Kitchen. - will start Trulicity  Carlus Pavlov, MD PhD [facility] Endocrinology

## 2017-01-06 NOTE — Patient Instructions (Addendum)
Try to switch from:  - Basaglar to [place] U200 50 units day.  Please continue Humalog 20-25 units 2x a day  Please start Trulicity 0.75 mg weekly. Let me know when you are close to running out to call in the higher dose to your pharmacy (1.5 mg).  Please return in 3 months with your sugar log.

## 2017-01-14 ENCOUNTER — Other Ambulatory Visit: Payer: Self-pay | Admitting: Internal Medicine

## 2017-01-20 ENCOUNTER — Ambulatory Visit: Payer: Self-pay | Admitting: Psychology

## 2017-01-26 ENCOUNTER — Other Ambulatory Visit: Payer: Self-pay | Admitting: Internal Medicine

## 2017-01-27 ENCOUNTER — Telehealth: Payer: Self-pay | Admitting: Internal Medicine

## 2017-01-27 NOTE — Telephone Encounter (Signed)
Pt called stating that her insurance will not cover Trulicity. She wanted to know if it could be changed to Victoza?  She would also like a refill on Basaglar Kwikpen 100 unit/ml. The pt [facility] [address].

## 2017-01-28 ENCOUNTER — Other Ambulatory Visit: Payer: Self-pay

## 2017-01-28 ENCOUNTER — Telehealth: Payer: Self-pay

## 2017-01-28 MED ORDER — BASAGLAR KWIKPEN 100 UNIT/ML ~~LOC~~ SOPN: PEN_INJECTOR | SUBCUTANEOUS | 0 refills | Status: DC

## 2017-01-28 MED ORDER — LIRAGLUTIDE 18 MG/3ML ~~LOC~~ SOPN: PEN_INJECTOR | SUBCUTANEOUS | 1 refills | Status: DC

## 2017-01-28 NOTE — Telephone Encounter (Signed)
Sent in for the AetnaBasalgar pens, please advise on the Trulicity question. Thank you!

## 2017-01-28 NOTE — Telephone Encounter (Signed)
Called and LVM advising patient of the note from MD on medication. Left call back number if any questions 

## 2017-01-28 NOTE — Telephone Encounter (Signed)
OK to send Victoza instead of Trulicity but this is once a day, before b'fast. Start at 1.2 mg daily and after few days can increase to 1.8 mg.

## 2017-01-28 NOTE — Telephone Encounter (Signed)
Called and LVM advising patient of the note from MD on medication. Left call back number if any questions

## 2017-02-02 ENCOUNTER — Ambulatory Visit (INDEPENDENT_AMBULATORY_CARE_PROVIDER_SITE_OTHER): Payer: Self-pay | Admitting: Psychology

## 2017-02-02 DIAGNOSIS — F411 Generalized anxiety disorder: Secondary | ICD-10-CM

## 2017-02-02 DIAGNOSIS — F341 Dysthymic disorder: Secondary | ICD-10-CM

## 2017-03-14 ENCOUNTER — Ambulatory Visit (INDEPENDENT_AMBULATORY_CARE_PROVIDER_SITE_OTHER): Payer: 59 | Admitting: Psychology

## 2017-03-14 DIAGNOSIS — F341 Dysthymic disorder: Secondary | ICD-10-CM | POA: Diagnosis not present

## 2017-03-14 DIAGNOSIS — F411 Generalized anxiety disorder: Secondary | ICD-10-CM | POA: Diagnosis not present

## 2017-03-31 ENCOUNTER — Ambulatory Visit: Payer: Self-pay | Admitting: Internal Medicine

## 2017-04-20 ENCOUNTER — Telehealth: Payer: Self-pay | Admitting: Internal Medicine

## 2017-04-28 ENCOUNTER — Ambulatory Visit (INDEPENDENT_AMBULATORY_CARE_PROVIDER_SITE_OTHER): Payer: 59 | Admitting: Psychology

## 2017-04-28 DIAGNOSIS — F411 Generalized anxiety disorder: Secondary | ICD-10-CM | POA: Diagnosis not present

## 2017-04-28 DIAGNOSIS — F341 Dysthymic disorder: Secondary | ICD-10-CM

## 2017-05-02 ENCOUNTER — Other Ambulatory Visit: Payer: Self-pay | Admitting: Internal Medicine

## 2017-05-04 ENCOUNTER — Other Ambulatory Visit: Payer: Self-pay | Admitting: Internal Medicine

## 2017-05-04 NOTE — Telephone Encounter (Signed)
Need refill of Thyroid (LEVOTHYROXINE-LIOTHYRONINE) 90 MG TABS [[id number]][[id number]]   Send to    [id number] - [place] - [address] AT [facility] OF [address] [phone] (Phone) [phone] (Fax)

## 2017-05-09 ENCOUNTER — Other Ambulatory Visit: Payer: Self-pay | Admitting: Internal Medicine

## 2017-05-18 ENCOUNTER — Ambulatory Visit (INDEPENDENT_AMBULATORY_CARE_PROVIDER_SITE_OTHER): Payer: 59 | Admitting: Psychology

## 2017-05-18 DIAGNOSIS — F341 Dysthymic disorder: Secondary | ICD-10-CM | POA: Diagnosis not present

## 2017-05-18 DIAGNOSIS — F411 Generalized anxiety disorder: Secondary | ICD-10-CM | POA: Diagnosis not present

## 2017-05-25 DIAGNOSIS — H8111 Benign paroxysmal vertigo, right ear: Secondary | ICD-10-CM | POA: Insufficient documentation

## 2017-05-25 DIAGNOSIS — H903 Sensorineural hearing loss, bilateral: Secondary | ICD-10-CM | POA: Insufficient documentation

## 2017-05-25 DIAGNOSIS — M26609 Unspecified temporomandibular joint disorder, unspecified side: Secondary | ICD-10-CM | POA: Insufficient documentation

## 2017-06-01 ENCOUNTER — Ambulatory Visit (INDEPENDENT_AMBULATORY_CARE_PROVIDER_SITE_OTHER): Payer: 59 | Admitting: Psychology

## 2017-06-01 ENCOUNTER — Ambulatory Visit: Payer: 59 | Admitting: Internal Medicine

## 2017-06-01 ENCOUNTER — Encounter: Payer: Self-pay | Admitting: Internal Medicine

## 2017-06-01 VITALS — BP 134/72 | HR 77 | Ht 61.0 in | Wt 236.0 lb

## 2017-06-01 DIAGNOSIS — F331 Major depressive disorder, recurrent, moderate: Secondary | ICD-10-CM | POA: Diagnosis not present

## 2017-06-01 DIAGNOSIS — E1165 Type 2 diabetes mellitus with hyperglycemia: Secondary | ICD-10-CM

## 2017-06-01 DIAGNOSIS — Z6841 Body Mass Index (BMI) 40.0 and over, adult: Secondary | ICD-10-CM | POA: Diagnosis not present

## 2017-06-01 DIAGNOSIS — Z794 Long term (current) use of insulin: Secondary | ICD-10-CM

## 2017-06-01 DIAGNOSIS — E039 Hypothyroidism, unspecified: Secondary | ICD-10-CM

## 2017-06-01 DIAGNOSIS — F411 Generalized anxiety disorder: Secondary | ICD-10-CM | POA: Diagnosis not present

## 2017-06-01 LAB — T3, FREE: T3, Free: 2.9 pg/mL (ref 2.3–4.2)

## 2017-06-01 LAB — T4, FREE: Free T4: 0.64 ng/dL (ref 0.60–1.60)

## 2017-06-01 LAB — TSH: TSH: 5.17 u[IU]/mL — ABNORMAL HIGH (ref 0.35–4.50)

## 2017-06-01 MED ORDER — LIRAGLUTIDE 18 MG/3ML ~~LOC~~ SOPN: PEN_INJECTOR | SUBCUTANEOUS | 3 refills | Status: DC

## 2017-06-01 MED ORDER — INSULIN LISPRO 200 UNIT/ML ~~LOC~~ SOPN: 25.0000 [IU] | PEN_INJECTOR | Freq: Three times a day (TID) | SUBCUTANEOUS | 3 refills | Status: DC

## 2017-06-01 MED ORDER — INSULIN DEGLUDEC 200 UNIT/ML ~~LOC~~ SOPN: 50.0000 [IU] | PEN_INJECTOR | Freq: Every day | SUBCUTANEOUS | 5 refills | Status: DC

## 2017-06-01 NOTE — Patient Instructions (Addendum)
Please change Basaglar to:  - Tresiba U200 50 units day.  Please continue:  - Humalog 25-30 units 2x a day  Restart:  - Victoza 0.6 mg daily in am before b'fast >> increase to 1.2 mg in 1 week, then 1.8 mg daily  Please return in 3 months with your sugar log.

## 2017-06-01 NOTE — Progress Notes (Signed)
Patient ID: MCKYNLIE VANDERSLICE, female   DOB: 12-05-1955, 62 y.o.   MRN: [id number]  HPI: Katie Cook is a 62 y.o.-year-old female, returning for f/u for DM2, insulin-dependent, uncontrolled, without long term complications, on insulin since 2008 and postsurgical hypothyroidism (after she had compression sx with MNG - noncancerous). Last visit 5 mo ago.  She has BPPV >> was admitted recently.  Hypothyroidism: Reviewed hx:: - she had MNG >> developed compression sxs - thyroidectomy in 2011-2012 - hypothyroidism >> Levothyroxine, then Armour 6 x later: 180 mg 4/7 days and 120 in 3/7 days >> b/c insurance coverage >> switched to Levothyroxine 200 mcg daily >> decreased gradually to 137 mcg.  She takes the NP (changed from NatureThroid) 90 mg (1.5 tab 5/7 days and 1 tab 2/7 days): - daily - in am - fasting - at least 30 min from b'fast - no Ca, Fe  + MVI, PPIs - later in the day - not on Biotin  Her TFTs have been uncontrolled: Lab Results  Component Value Date   TSH 5.89 (H) 12/30/2016   TSH 1.24 07/28/2016   TSH 0.29 (L) 05/28/2016   TSH 0.047 (L) 04/23/2016   TSH 6.06 (H) 01/09/2016   FREET4 0.71 12/30/2016   FREET4 0.73 07/28/2016   FREET4 0.81 05/28/2016   FREET4 1.40 04/23/2016   FREET4 0.62 01/09/2016  04/2013: TSH 0.4 (at the [facility])  DM2: - h/o GDM during pregnancy, then DM2 in 2005  Last hemoglobin A1c was:  Lab Results  Component Value Date   HGBA1C 8.9 01/06/2017   HGBA1C 8.6 07/28/2016   HGBA1C 8.2 04/23/2016  ~8% 04/2013.  Pt is on a regimen of: - Lantus 50 units in am. [place] Poli x2 mo >> not covered anymore >> Basaglar 50 units daily in am >> >> did not get this - Humalog 25-30 units 2x a day  - may skip  >> stopped as she ran out Trulicity was not covered.  Could not afford: Januvia 100 mg daily, Tradjenta 5 mg in am She was Invokana 100 mg - added 01/2014 >> initially stopped for diarrhea >> restarted 06/2014 >> but N/D >> restarted 10/2014  >> yeast inf >> stopped She stopped  Metformin XR 500 mg at lunchtime and 1000 mg at dinner - started 09/2013 >> nausea, diarrhea. She stopped Victoza >> nausea, 3 years. She tried Actos. She tried regular Metformin >> nausea. She tried Prandin.  Pt checks her sugars  2-3x a day - reviewed meter download together: - am:  96, 127, 175, 216 >> 170-180 >> 128, 159-286, 314  - before lunch: 120-258 >> 164, 182, 231, 314 >> n/c >> 95, 148-329 - 2h after lunch: 206 >> 188-210 >> 395 >> 140-150s >> 133, 198-220 - before dinner:  234 >> 132, 183, 272, 298 >> n/c >> 62, 146-222 - 2h after dinner: 236-274  >> 156 >> n/c >> 170-250s >> 279, 419 - bedtime: 183, 336 >> 99, 216, 316 low 200s >> n/c - nighttime:  77, 176 >> 85, 100, 287 >> 103 >> n/c Lowest sugar was  99 >> 60s  >> 62; she has hypoglycemia awareness in the 70s.  Highest sugar was 316 >> 300 x1 >> 419  Meter: AccuChek [employer]iva.  Pt's meals: - Breakfast: yoghurt + fruit - Lunch: salad with [place] - Dinner: chicken, broccoli, meat - Snacks: 2-3: fruit, popcorn  - No CKD, last BUN/creatinine:  Lab Results  Component Value Date   BUN 12 12/30/2016  CREATININE 0.58 12/30/2016  03/19/2014: 17/0.75 On Losartan. - + HL.  Lab Results  Component Value Date   CHOL 175 12/30/2016   HDL 66.50 12/30/2016   LDLCALC 92 12/30/2016   TRIG 83.0 12/30/2016   CHOLHDL 3 12/30/2016  04/08/2015: 203/103/58/124 03/19/2014: 183/138/52/103 Not on a statin. - last eye exam was in 2018 >> No DR. +  Cataract. Dr. Emily FilbertGould. - no numbness and tingling in her feet.  ROS: Constitutional: + weight loss (1 LB per our scale), + fatigue, no subjective hyperthermia, no subjective hypothermia Eyes: no blurry vision, no xerophthalmia ENT: no sore throat, no nodules palpated in throat, no dysphagia, no odynophagia, + hoarseness Cardiovascular: no CP/no SOB/no palpitations/no leg swelling Respiratory:+ cough/no SOB/no wheezing Gastrointestinal: + N/no V/+ D/+  C/no acid reflux Musculoskeletal: + muscle aches/no joint aches Skin: no rashes, + hair loss, + bruise Neurological: no tremors/no numbness/no tingling/no dizziness, + HA  I reviewed pt's medications, allergies, PMH, social hx, family hx, and changes were documented in the history of present illness. Otherwise, unchanged from my initial visit note.  PE: BP 134/72 (BP Location: Right Arm, Patient Position: Sitting, Cuff Size: Large)   Pulse 77   Ht 5\' 1"  (1.549 m)   Wt 236 lb (107 kg)   BMI 44.59 kg/m  Body mass index is 44.59 kg/m.  Wt Readings from Last 3 Encounters:  06/01/17 236 lb (107 kg)  01/06/17 237 lb (107.5 kg)  01/03/17 234 lb (106.1 kg)   Constitutional: overweight, in NAD Eyes: PERRLA, EOMI, no exophthalmos ENT: moist mucous membranes, no neck masses, no cervical lymphadenopathy Cardiovascular: RRR, No MRG Respiratory: CTA B Gastrointestinal: abdomen soft, NT, ND, BS+ Musculoskeletal: no deformities, strength intact in all 4 Skin: moist, warm, no rashes Neurological: no tremor with outstretched hands, DTR normal in all 4   ASSESSMENT: 1. DM2, insulin-dependent, uncontrolled, without long term complications, but with hyperglycemia - I suggested Invokana in the past, to also help with her high BP and also her obesity, but she did not start afraid of side effects.  - I suggested Trulicity in the past >> did not start b/c high cost >> we discussed to try a coupon card. Discussed that this will also help her with weight loss. - she thinks [facility] doctor[facility]aglar not helping much >> changed to [place]. This would be preferred also b/c of her tendency for low CBGs, which Evaristo Buryresiba may reduce.  2. Hypothyroidism - postsurgical - thyroidectomy 2011 - h/o benign MNG  3. Obesity  PLAN:  1. Patient with long-standing uncontrolled DM2, on basal-bolus insulin regimen and started GLP1 R agonist at last visit as sugars were higher. At this visit >> sugars are even higher. Today, HbA1c is  9.4% (higher) - Trulicity was not covered >> I suggested Victoza. She started (tolerated this well) but did not continue after she ran out... Will restart. She also did not obtain Tresiba from the pharmacy as suggested at last visit >> will re-send it and also given her a written Rx.  - I advised her to:  Patient Instructions  Please change Basaglar to:  - Tresiba U200 50 units day.  Please continue:  - Humalog 25-30 units 2x a day  Restart:  - Victoza 0.6 mg daily in am before b'fast >> increase to 1.2 mg in 1 week, then 1.8 mg daily  Please return in 3 months with your sugar log.   - continue checking sugars at different times of the day - check 3x a day, rotating  checks - advised for yearly eye exams >> she is UTD - Return to clinic in 3 mo with sugar log   2. Hypothyroidism - continues NP thyroid 135 mg 5/7 days and 90 mg 2/7 days.  - uncontrolled >> most likely 2/2 noncompliance - pt feels good on the above dose. - we discussed about taking the thyroid hormone every day, with water, >30 minutes before breakfast, separated by >4 hours from acid reflux medications, calcium, iron, multivitamins. Pt. Reports taking it correctly. - will check thyroid tests today: TSH, fT3, and fT4 - If labs are abnormal, she will need to return for repeat TFTs in 1.5 months  3. Obesity - started GLP1 at last visit, but she did not continue as she ran out... She tolerated Victoza well >> will restart. She thinks she had more nausea with weekly injectables in the past. She is also working on improving diet.  Component     Latest Ref Rng & Units 06/01/2017  TSH     0.35 - 4.50 uIU/mL 5.17 (H)  T4,Free(Direct)     0.60 - 1.60 ng/dL 1.61  Triiodothyronine,Free,Serum     2.3 - 4.2 pg/mL 2.9   TSH improved, only slightly above ULN >> can continue the current dose of NP thyroid  Carlus Pavlov, MD PhD [facility]

## 2017-06-03 ENCOUNTER — Telehealth: Payer: Self-pay | Admitting: Internal Medicine

## 2017-06-03 ENCOUNTER — Other Ambulatory Visit: Payer: Self-pay | Admitting: Internal Medicine

## 2017-06-03 LAB — POCT GLYCOSYLATED HEMOGLOBIN (HGB A1C): Hemoglobin A1C: 9.4

## 2017-06-03 MED ORDER — NP THYROID 90 MG PO TABS: ORAL_TABLET | ORAL | 3 refills | Status: DC

## 2017-06-03 NOTE — Telephone Encounter (Signed)
See separate msg - I sent them

## 2017-06-03 NOTE — Telephone Encounter (Signed)
Patient is waiting for lab results because Dr. Elvera LennoxGherghe told her to wait until lab results were in for her thyroid before she got a refill on her thyroid MP medication (in case Dr wanted to change the medication). She is out of her thyroid medication. Please send script for thyroid medication to [facility] on [address]. Patient would like to be informed once script has been sent to pharmacy.

## 2017-06-03 NOTE — Telephone Encounter (Signed)
Please advise on below  

## 2017-06-09 ENCOUNTER — Ambulatory Visit: Payer: Self-pay | Admitting: Internal Medicine

## 2017-06-13 ENCOUNTER — Ambulatory Visit (INDEPENDENT_AMBULATORY_CARE_PROVIDER_SITE_OTHER): Payer: 59 | Admitting: Psychology

## 2017-06-13 DIAGNOSIS — F331 Major depressive disorder, recurrent, moderate: Secondary | ICD-10-CM | POA: Diagnosis not present

## 2017-06-13 DIAGNOSIS — F411 Generalized anxiety disorder: Secondary | ICD-10-CM

## 2017-06-29 ENCOUNTER — Ambulatory Visit: Payer: 59 | Admitting: Psychology

## 2017-07-07 ENCOUNTER — Ambulatory Visit (INDEPENDENT_AMBULATORY_CARE_PROVIDER_SITE_OTHER): Payer: 59 | Admitting: Psychology

## 2017-07-07 DIAGNOSIS — F411 Generalized anxiety disorder: Secondary | ICD-10-CM | POA: Diagnosis not present

## 2017-07-07 DIAGNOSIS — F331 Major depressive disorder, recurrent, moderate: Secondary | ICD-10-CM

## 2017-08-04 ENCOUNTER — Ambulatory Visit: Payer: 59 | Admitting: Psychology

## 2017-08-05 ENCOUNTER — Telehealth: Payer: Self-pay | Admitting: Internal Medicine

## 2017-08-05 NOTE — Telephone Encounter (Signed)
Pt called about no longer having insurance and needs a some samples until her medicaid goes through. Pt does some humalog left. Please advise?  Insulin Lispro (HUMALOG KWIKPEN) 200 UNIT/ML SOPN  Insulin Degludec (TRESIBA FLEXTOUCH) 200 UNIT/ML SOPN  Thyroid (LEVOTHYROXINE-LIOTHYRONINE) 90 MG TABS  Call pt @ [phone]. Thank you!

## 2017-08-08 NOTE — Telephone Encounter (Signed)
Pt requesting samples of:   Humalog Durenda Hurt Flextouch Thyroid 90 mg suggestions  Advised pt samples available of Humalog KwikPen, discount card for [place], and gave [employer] for Thyroid . Pt was very grateful.

## 2017-08-08 NOTE — Telephone Encounter (Signed)
Called pt. No answer °

## 2017-08-09 NOTE — Telephone Encounter (Signed)
Great! Thank you so much!

## 2017-08-19 ENCOUNTER — Telehealth: Payer: Self-pay | Admitting: Internal Medicine

## 2017-08-19 DIAGNOSIS — E039 Hypothyroidism, unspecified: Secondary | ICD-10-CM

## 2017-08-19 MED ORDER — LEVOTHYROXINE SODIUM 200 MCG PO TABS: 200.0000 ug | ORAL_TABLET | Freq: Every day | ORAL | 0 refills | Status: DC

## 2017-08-19 NOTE — Telephone Encounter (Signed)
OK, let's send a Rx for LT4 200 mcg daily. She will need labs in 1.5 mo >> TSH, fT4. Can you please order?

## 2017-08-19 NOTE — Telephone Encounter (Signed)
New rx sent. See meds.  Labs ordered. Pt informed of below.

## 2017-08-19 NOTE — Telephone Encounter (Signed)
Patient need a new prescription for synthroid generic brand because patient do not have insurance and she can pay for, the levothyroxine   Pharmacy:  [id number] - [place], [place] - [address] AT [facility] [address] [facility] [address] DEA #:  [id number]

## 2017-08-25 ENCOUNTER — Ambulatory Visit: Payer: Medicaid Other | Admitting: Psychology

## 2017-08-30 ENCOUNTER — Ambulatory Visit: Payer: Self-pay | Admitting: Internal Medicine

## 2017-09-07 ENCOUNTER — Ambulatory Visit: Payer: Medicaid Other | Admitting: Psychology

## 2017-09-08 ENCOUNTER — Telehealth: Payer: Self-pay | Admitting: Internal Medicine

## 2017-09-08 NOTE — Telephone Encounter (Signed)
I do not remember telling her this, since I do not think we have them.  We can give her some samples of NovoLog or Humalog U100 if we have

## 2017-09-08 NOTE — Telephone Encounter (Signed)
We do not have U200 samples at the moment but U100 please advise

## 2017-09-08 NOTE — Telephone Encounter (Signed)
Patient stated that Gherghe told her that she can come in and get a sample for the  Insulin Lispro (HUMALOG KWIKPEN) 200 UNIT/ML SOPN [[id number]] . Please let he know when to come pick it up

## 2017-09-09 NOTE — Telephone Encounter (Signed)
Gave samples of the U100 Humalog [employer]

## 2017-10-19 ENCOUNTER — Telehealth: Payer: Self-pay | Admitting: Internal Medicine

## 2017-10-19 NOTE — Telephone Encounter (Signed)
? -   I am not sure if her Humalog arrived... We can give her a sample if not.

## 2017-10-19 NOTE — Telephone Encounter (Signed)
Please advise 

## 2017-10-19 NOTE — Telephone Encounter (Signed)
Patient needs Humalog from our office. Wants to pick up Humalog here 10/20/17. Please call patient at [phone] when ready for pick up.

## 2017-10-20 NOTE — Telephone Encounter (Signed)
We can give her Humalog U100, or NovoLog, or Apidra, which ever we have

## 2017-10-20 NOTE — Telephone Encounter (Signed)
Did not see anything for her and we do not have samples for humalog 200 which is what she is on

## 2017-10-21 ENCOUNTER — Telehealth: Payer: Self-pay | Admitting: Internal Medicine

## 2017-10-21 ENCOUNTER — Encounter: Payer: Self-pay | Admitting: Internal Medicine

## 2017-10-21 NOTE — Telephone Encounter (Signed)
Per [facility]-Caller (patient) states she needs to get a refill on her Humalog

## 2017-10-21 NOTE — Telephone Encounter (Signed)
Patient calling back about samples of insulin.  Please advise

## 2017-10-21 NOTE — Progress Notes (Signed)
Pt stated she would like to wait on this plan she will bring her patient assistance forms on Monday and then if that does not work out she will call so that we can send in the regimen.

## 2017-10-21 NOTE — Telephone Encounter (Signed)
Two pens for pt, pt is aware we can not continue with the samples and she may need to see Dr Elvera LennoxGherghe to have medication changed to something she can afford

## 2017-10-21 NOTE — Progress Notes (Signed)
Patient does not have insurance.  She cannot afford her analog insulins.  We gave her samples, but will also try the following regimen in case she cannot get patient assistance:  Insulin Before breakfast Before lunch Before dinner  Regular 25-30 10-20 25-30  NPH 35  30  Inject the insulin 30 min before meals.

## 2017-10-21 NOTE — Progress Notes (Signed)
LMTCB

## 2017-10-25 NOTE — Progress Notes (Signed)
Pt came by today and dropped off her [employer] application. She stated she only has 1 pen left of the Humalog and her sugars are still high. She also says she can not afford to [facility] and would like to know which one is the best for her to buy until her Patient assistance arrives? Money is a big issue for this patient and I am unsure of what options to give her maybe [facility]? Please advise

## 2017-10-31 ENCOUNTER — Telehealth: Payer: Self-pay | Admitting: Emergency Medicine

## 2017-10-31 DIAGNOSIS — Z794 Long term (current) use of insulin: Principal | ICD-10-CM

## 2017-10-31 DIAGNOSIS — E1165 Type 2 diabetes mellitus with hyperglycemia: Secondary | ICD-10-CM

## 2017-10-31 NOTE — Telephone Encounter (Signed)
Pt called and wants to know if she can get a refill on her Insulin Lispro (HUMALOG KWIKPEN) 200 UNIT/ML SOPN. [facility] [address]. Thanks.

## 2017-10-31 NOTE — Telephone Encounter (Signed)
Spoke with pt and she will be picking up the [facility] counter [facility] at [facility]

## 2017-11-02 ENCOUNTER — Telehealth: Payer: Self-pay | Admitting: Internal Medicine

## 2017-11-02 MED ORDER — INSULIN NPH (HUMAN) (ISOPHANE) 100 UNIT/ML ~~LOC~~ SUSP: SUBCUTANEOUS | 3 refills | Status: DC

## 2017-11-02 MED ORDER — INSULIN REGULAR HUMAN 100 UNIT/ML IJ SOLN: INTRAMUSCULAR | 3 refills | Status: DC

## 2017-11-02 NOTE — Telephone Encounter (Signed)
Patient stated she would like a call back to discuss a medication she is on. Please advise

## 2017-11-02 NOTE — Telephone Encounter (Signed)
Sent medications as a RX to see if cheaper and placed forms on Gherghes desk to be completed for pt

## 2017-11-04 ENCOUNTER — Telehealth: Payer: Self-pay | Admitting: Internal Medicine

## 2017-11-04 NOTE — Telephone Encounter (Signed)
Patient is calling on the status of paperwork that was sent back by stephanie  Please advise.

## 2017-11-04 NOTE — Telephone Encounter (Signed)
Spoke with pt forms have been faxed on the 24th and we heard back from only one company so far, [employer], and they needed to talk with pt. Pt was given phone number to reach out to them

## 2017-11-15 ENCOUNTER — Telehealth: Payer: Self-pay | Admitting: Internal Medicine

## 2017-11-15 NOTE — Telephone Encounter (Signed)
Patient would like a call back to discuss her Humalog.   Please advise

## 2017-11-15 NOTE — Telephone Encounter (Signed)
LMTCB

## 2017-11-15 NOTE — Telephone Encounter (Signed)
Spoke with pt and she is completely out of medication. She stated that her house is getting forclosed on and she has no money. She was approved for pt assistance but it has not arrived. Gave her samples of Humalog to last her till the patient assistance come in.

## 2017-11-17 ENCOUNTER — Telehealth: Payer: Self-pay | Admitting: Internal Medicine

## 2017-11-17 NOTE — Telephone Encounter (Signed)
Pt is aware.  

## 2017-11-17 NOTE — Telephone Encounter (Signed)
LM for pt to come get the humalog samples that were delivered today from patient assistance.

## 2017-11-28 ENCOUNTER — Telehealth: Payer: Self-pay

## 2017-11-28 NOTE — Telephone Encounter (Signed)
Received patient assistance from [employer], called and LVM for pt letting her know

## 2017-12-23 IMAGING — CT CT ABD-PELV W/ CM
2 of 5 series · 15 of 46 positions shown, 17 images · IV contrast (APPLIED)
Comparison: December 27, 2007

CLINICAL DATA: Right upper quadrant pain

EXAM:
CT ABDOMEN AND PELVIS WITH CONTRAST
TECHNIQUE: Multidetector CT imaging of the abdomen and pelvis was performed
using the standard protocol following bolus administration of
intravenous contrast. Oral contrast was also administered.
CONTRAST:  100mL SZQI69-VTT IOPAMIDOL (SZQI69-VTT) INJECTION 61%

[Series 2: axial st · axial · 0.83mm/px · z∈[+1266,+1646]mm · 12 of 90 slices shown, 14 images]
[im 7/90  soft-tissue]
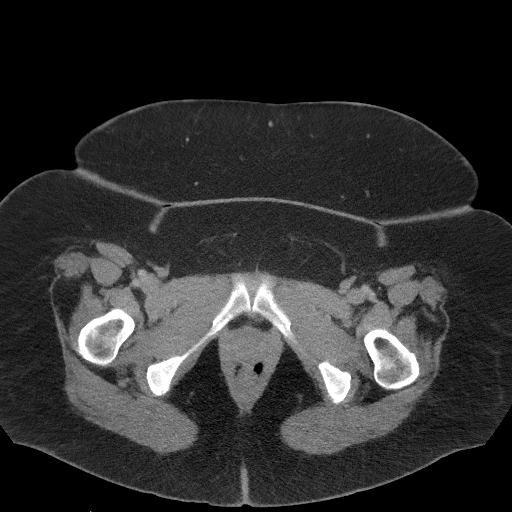
[im 7/90  bone]
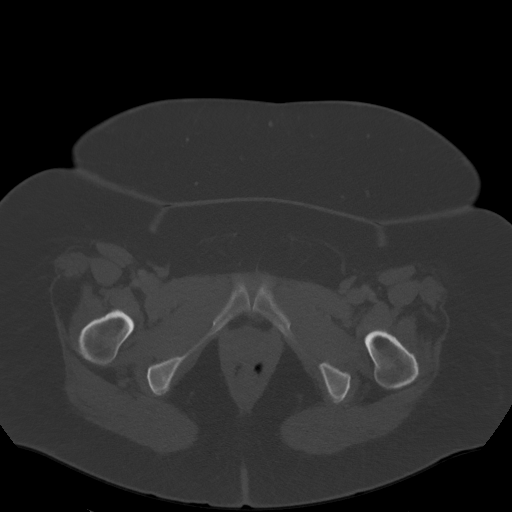
[im 14/90  soft-tissue]
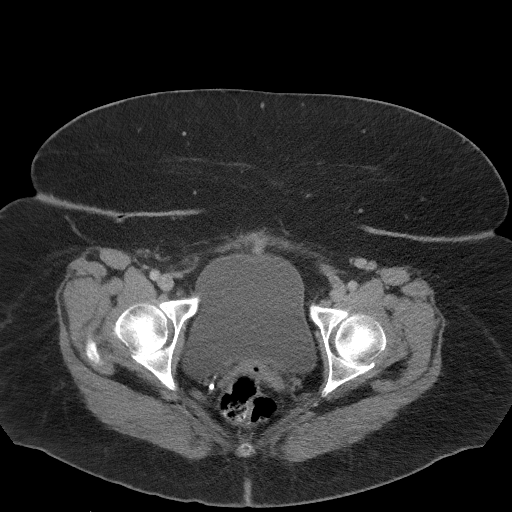
[im 21/90  soft-tissue]
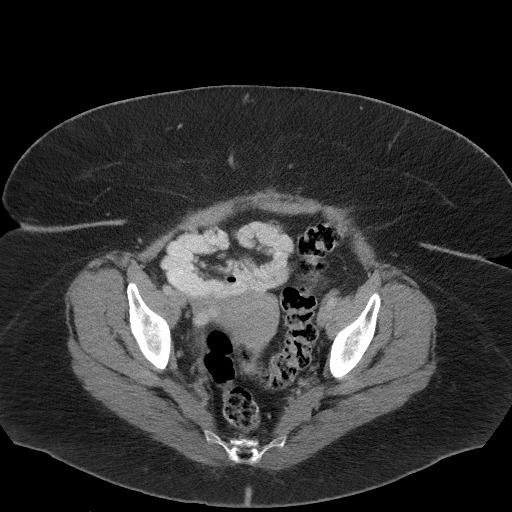
[im 28/90  soft-tissue]
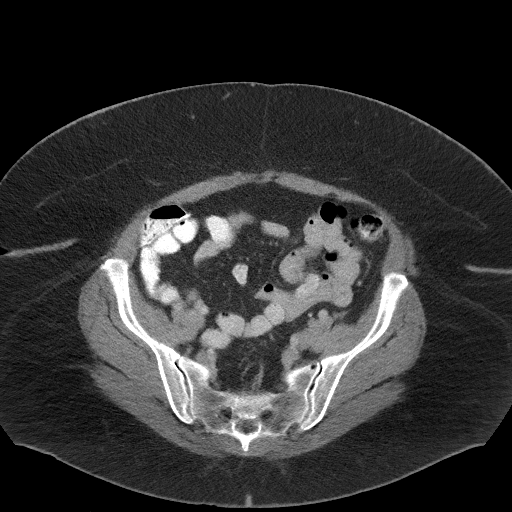
[im 35/90  soft-tissue]
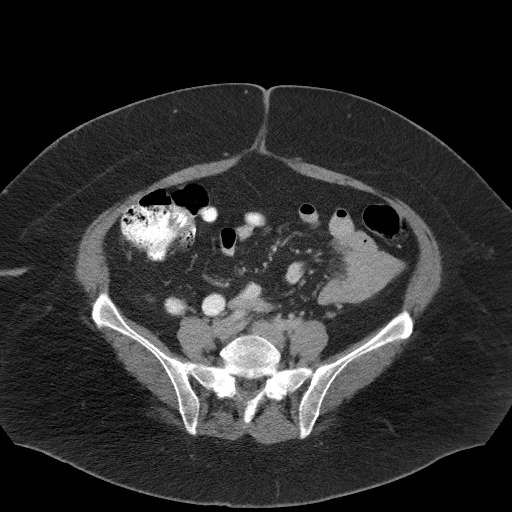
[im 42/90  soft-tissue]
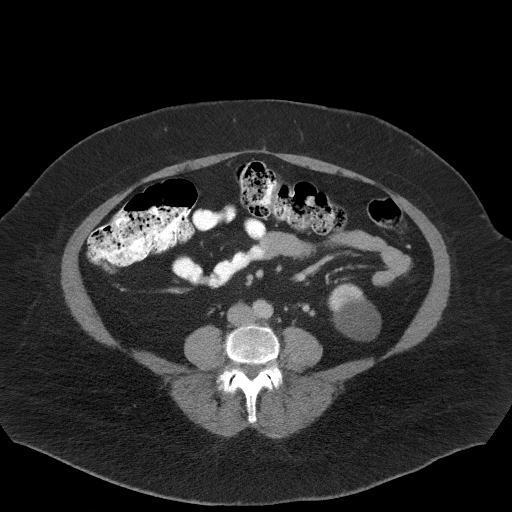
[im 48/90  soft-tissue]
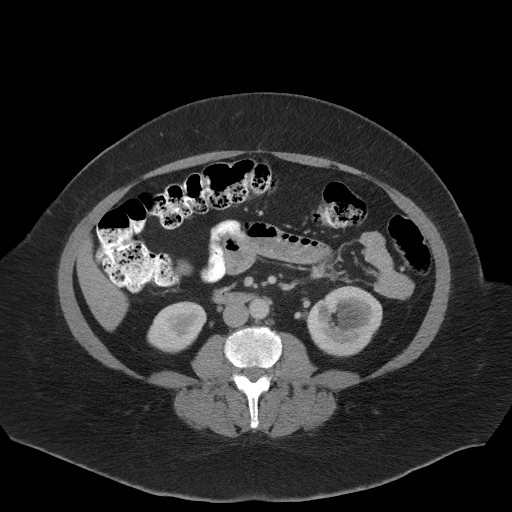
[im 55/90  soft-tissue]
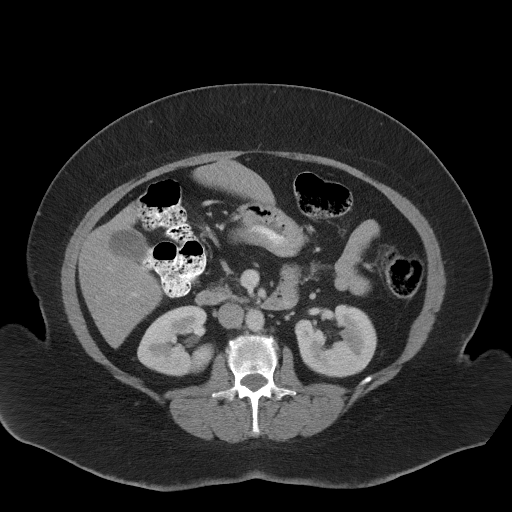
[im 62/90  soft-tissue]
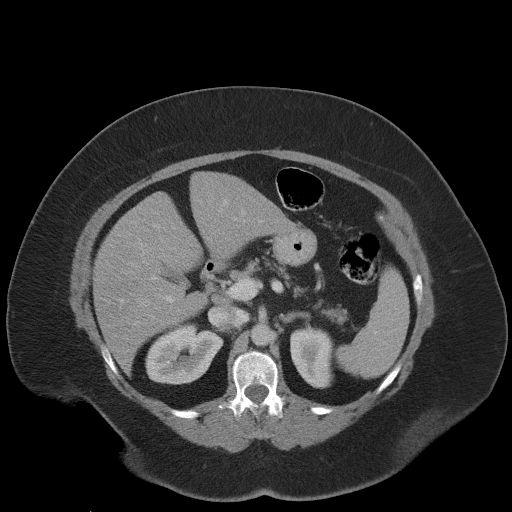
[im 62/90  bone]
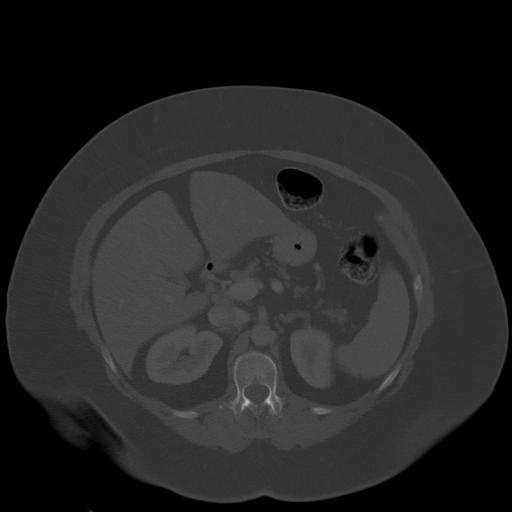
[im 69/90  soft-tissue]
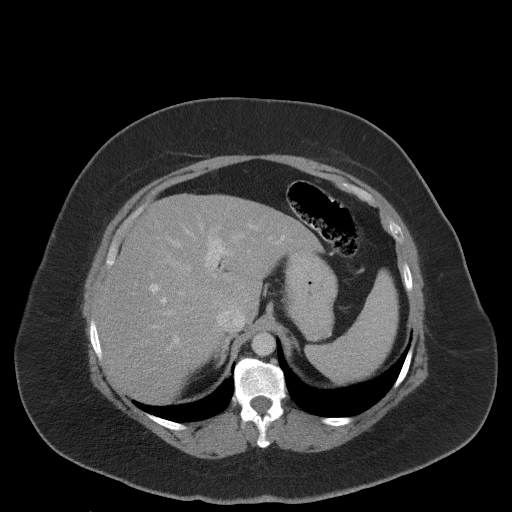
[im 76/90  soft-tissue]
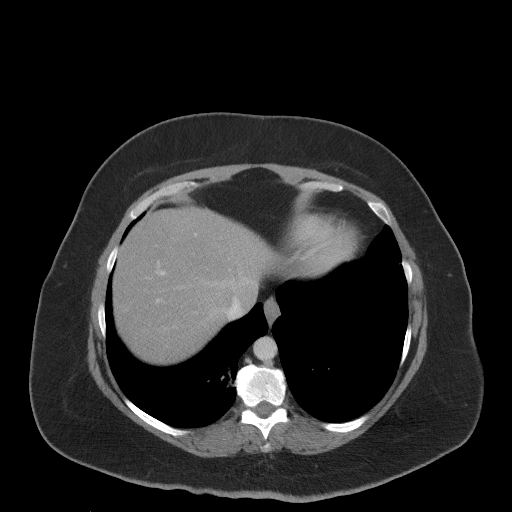
[im 83/90  soft-tissue]
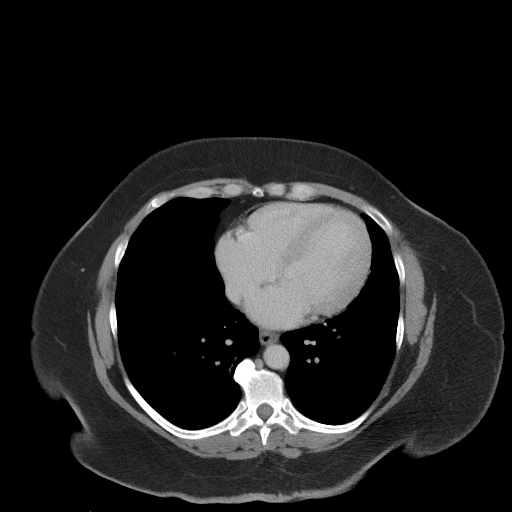

[Series 5: coronal st · coronal · 0.69mm/px · 3 of 108 slices shown]
[im 36/108  soft-tissue]
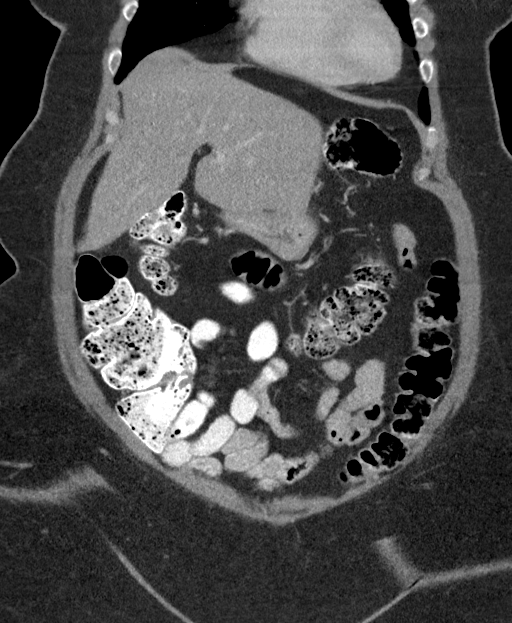
[im 48/108  soft-tissue]
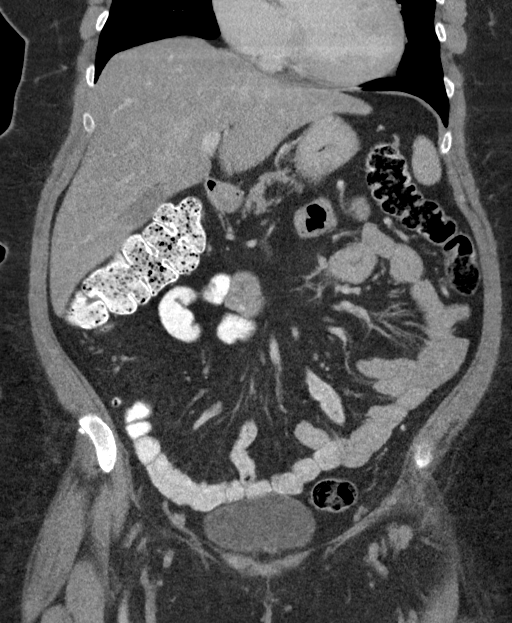
[im 60/108  soft-tissue]
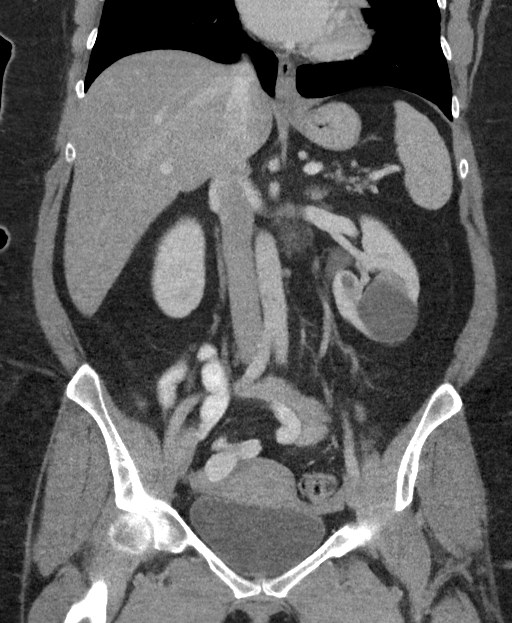

[15 of 46 positions shown; findings below may reference images not displayed]

FINDINGS: Lower chest: There is atelectatic change in the medial right base.
There is mild atelectasis in the inferior lingula as well. Lung
bases elsewhere are clear. There are foci of coronary artery
calcification.

Hepatobiliary: Liver measures 19.9 cm in length. There is hepatic
steatosis. No focal liver lesions are apparent. Gallbladder wall is
not appreciably thickened. There is no biliary duct dilatation.

Pancreas: There is no pancreatic mass or inflammatory focus.

Spleen: No splenic lesions are evident.

Adrenals/Urinary Tract: Adrenals appear normal. There is a cyst
arising from the lower pole the left kidney measuring 4.2 x 3.7 cm.
There is no hydronephrosis on either side. There is no renal or
ureteral calculus on either side. Urinary bladder is midline with
wall thickness within normal limits.

Stomach/Bowel: There are sigmoid diverticulum without
diverticulitis. There is no appreciable bowel wall or mesenteric
thickening. There is no bowel obstruction. No free air or portal
venous air.

Vascular/Lymphatic: There is no abdominal aortic aneurysm. No
vascular lesions apparent. No adenopathy is appreciable in the
abdomen or pelvis.

Reproductive: Uterus is anteverted.  There is no pelvic mass.

Other: Appendix appears normal. There is no ascites or abscess in
the abdomen or pelvis. There is a small ventral hernia containing
only fat.

Musculoskeletal: There is degenerative change in the lumbar spine.
There is spinal stenosis at L4-5 due to a combination of bony
hypertrophy and diffuse disc protrusion. There is moderate disc
protrusion at L5-S1 as well with borderline spinal stenosis. There
are no blastic or lytic bone lesions. There is no intramuscular or
abdominal wall lesion.
IMPRESSION: Prominent liver with hepatic steatosis. No focal liver lesions
evident. Gallbladder wall does not appear appreciably thickened by
CT.

Sigmoid diverticulum without diverticulitis. No bowel obstruction or
bowel wall thickening. No abscess. Appendix appears normal.

No renal or ureteral calculus.  No hydronephrosis.

Spinal stenosis at L4-5 and borderline spinal stenosis at L5-S1,
multifactorial.

Small ventral hernia containing only fat.

## 2017-12-26 ENCOUNTER — Encounter: Payer: Self-pay | Admitting: Internal Medicine

## 2017-12-26 ENCOUNTER — Ambulatory Visit (INDEPENDENT_AMBULATORY_CARE_PROVIDER_SITE_OTHER): Payer: No Typology Code available for payment source | Admitting: Internal Medicine

## 2017-12-26 VITALS — BP 150/82 | HR 86 | Ht 61.0 in | Wt 232.0 lb

## 2017-12-26 DIAGNOSIS — Z794 Long term (current) use of insulin: Secondary | ICD-10-CM

## 2017-12-26 DIAGNOSIS — E1165 Type 2 diabetes mellitus with hyperglycemia: Secondary | ICD-10-CM

## 2017-12-26 DIAGNOSIS — E039 Hypothyroidism, unspecified: Secondary | ICD-10-CM

## 2017-12-26 DIAGNOSIS — E661 Drug-induced obesity: Secondary | ICD-10-CM

## 2017-12-26 LAB — T4, FREE: Free T4: 1.38 ng/dL (ref 0.60–1.60)

## 2017-12-26 LAB — TSH: TSH: 0.04 u[IU]/mL — ABNORMAL LOW (ref 0.35–4.50)

## 2017-12-26 LAB — POCT GLYCOSYLATED HEMOGLOBIN (HGB A1C): Hemoglobin A1C: 8.3 % — AB (ref 4.0–5.6)

## 2017-12-26 LAB — T3, FREE: T3, Free: 3 pg/mL (ref 2.3–4.2)

## 2017-12-26 MED ORDER — LEVOTHYROXINE SODIUM 175 MCG PO TABS: 175.0000 ug | ORAL_TABLET | Freq: Every day | ORAL | 3 refills | Status: DC

## 2017-12-26 NOTE — Patient Instructions (Addendum)
Please increase: - Tresiba U200 to 54 units day.  Please continue: - Humalog 25-30 units 2x a day - Victoza 1.8 mg daily   Please continue: Levothyroxine 200 mcg daily.  Please stop at the lab.  Please return in 3-4 months with your sugar log.

## 2017-12-26 NOTE — Progress Notes (Signed)
Patient ID: Katie Cook, female   DOB: June 29, 1955, 62 y.o.   MRN: [id number]  HPI: [name] is a 62 y.o.-year-old female, returning for f/u for DM2, insulin-dependent, uncontrolled, without long term complications, on insulin since 2008 and postsurgical hypothyroidism (after she had compression sx with MNG - noncancerous). Last visit 7 mo ago.  Her sugars improved in last 1 mo after she restarted to take her meds as prescribed. She was w/o insurance and finally obtained patient assistance from [employer] and [employer].  She started the  recommended regimen only approximately 3 weeks ago.  Sugars improved significantly.  She also feels better.  Hypothyroidism: Reviewed and addended history: - she had MNG >> developed compression sxs - Thyroidectomy 2011-2012 - hypothyroidism >> Levothyroxine, then Armour, then levothyroxine, then Nature-Throid, then NP thyroid, and now levothyroxine due to price.  She is on levothyroxine 200 mcg daily: - in am - fasting - at least 30 min from b'fast - no Ca, Fe - + MVI, PPIs -later in the day - not on Biotin  Her TFTs remain uncontrolled: Lab Results  Component Value Date   TSH 5.17 (H) 06/01/2017   TSH 5.89 (H) 12/30/2016   TSH 1.24 07/28/2016   TSH 0.29 (L) 05/28/2016   TSH 0.047 (L) 04/23/2016   FREET4 0.64 06/01/2017   FREET4 0.71 12/30/2016   FREET4 0.73 07/28/2016   FREET4 0.81 05/28/2016   FREET4 1.40 04/23/2016  04/2013: TSH 0.4 (at the [facility])  DM2: - h/o GDM during pregnancy, then DM2 in 2005  Last hemoglobin A1c was:  Lab Results  Component Value Date   HGBA1C 9.4% 06/03/2017   HGBA1C 8.9 01/06/2017   HGBA1C 8.6 07/28/2016  ~8% 04/2013.  Pt is on a regimen of: - Tresiba U200 50 units day. - she restarted this 3 weeks ago, before, only on Humalog - Humalog 25-30 units 2x a day  - Victoza 1.8 mg daily - restarted 11/2017 Trulicity was not covered.  Could not afford: Januvia 100 mg daily, Tradjenta 5 mg in am She was  Invokana 100 mg - added 01/2014 >> initially stopped for diarrhea >> restarted 06/2014 >> but N/D >> restarted 10/2014 >> yeast inf >> stopped She stopped  Metformin XR 500 mg at lunchtime and 1000 mg at dinner - started 09/2013 >> nausea, diarrhea. She stopped Victoza >> nausea, 3 years. She tried Actos. She tried regular Metformin >> nausea. She tried Prandin.  Pt checks her sugars 2-3 times a day: - am: 170-180 >> 128, 159-286, 314  >> 133-221, 231 - before lunch: 164, 182, 231, 314 >> n/c >> 95, 148-329 >> 78, 160 - 2h after lunch:  395 >> 140-150s >> 133, 198-220 >> n/c - before dinner:  132, 183, 272, 298 >> n/c >> 62, 146-222 >> 126, 154 - 2h after dinner: 156 >> n/c >> 170-250s >> 279, 419 >> n/c - bedtime: 183, 336 >> 99, 216, 316 low 200s >> n/c >> 89, 101-200, 217 - nighttime:  77, 176 >> 85, 100, 287 >> 103 >> n/c >> 288 Lowest sugar was   62 >> ; she has hypoglycemia awareness in the 70s. Highest sugar was 419 >> 288  Meter: AccuChek [employer].  Pt's meals: - Breakfast: yoghurt + fruit - Lunch: salad with [place] - Dinner: chicken, broccoli, meat - Snacks: 2-3: fruit, popcorn  -No CKD, last BUN/creatinine:  Lab Results  Component Value Date   BUN 12 12/30/2016   CREATININE 0.58 12/30/2016  03/19/2014:  17/0.75 On losartan. -+ HL.  Lab Results  Component Value Date   CHOL 175 12/30/2016   HDL 66.50 12/30/2016   LDLCALC 92 12/30/2016   TRIG 83.0 12/30/2016   CHOLHDL 3 12/30/2016  04/08/2015: 203/103/58/124 03/19/2014: 183/138/52/103 Not on a statin. - last eye exam was in 2019: No DR, + cataract; Dr. Emily Filbert. -No numbness and tingling in her feet.  ROS: Constitutional: no weight gain/no weight loss, + fatigue, + hot flashes, no subjective hypothermia Eyes: no blurry vision, no xerophthalmia ENT: + Sore throat, no nodules palpated in throat, no dysphagia, no odynophagia, no hoarseness Cardiovascular: no CP/no SOB/no palpitations/no leg swelling Respiratory: no  cough/no SOB/no wheezing Gastrointestinal: + N/no V/no D/no C/+ acid reflux Musculoskeletal: + Muscle aches/no joint aches Skin: no rashes, no hair loss Neurological: no tremors/no numbness/no tingling/no dizziness  I reviewed pt's medications, allergies, PMH, social hx, family hx, and changes were documented in the history of present illness. Otherwise, unchanged from my initial visit note.  Past Medical History:  Diagnosis Date  . Diabetes mellitus without complication (HCC)   . Hypertension   . Thyroid disease   . Uncontrolled diabetes mellitus type 2 without complications (HCC) 07/26/2013   Past Surgical History:  Procedure Laterality Date  . CESAREAN SECTION    . THYROID SURGERY     Social History   Socioeconomic History  . Marital status: Legally Separated    Spouse name: Not on file  . Number of children: 3  . Years of education: Some college  . Highest education level: Not on file  Occupational History  . Not on file  Social Needs  . Financial resource strain: Not on file  . Food insecurity:    Worry: Not on file    Inability: Not on file  . Transportation needs:    Medical: Not on file    Non-medical: Not on file  Tobacco Use  . Smoking status: Never Smoker  . Smokeless tobacco: Never Used  Substance and Sexual Activity  . Alcohol use: No    Alcohol/week: 0.0 standard drinks  . Drug use: No  . Sexual activity: Not on file  Lifestyle  . Physical activity:    Days per week: Not on file    Minutes per session: Not on file  . Stress: Not on file  Relationships  . Social connections:    Talks on phone: Not on file    Gets together: Not on file    Attends religious service: Not on file    Active member of club or organization: Not on file    Attends meetings of clubs or organizations: Not on file    Relationship status: Not on file  . Intimate partner violence:    Fear of current or ex partner: Not on file    Emotionally abused: Not on file     Physically abused: Not on file    Forced sexual activity: Not on file  Other Topics Concern  . Not on file  Social History Narrative   Lives alone   Caffeine use: Decaf tea. Soda rarely        Current Outpatient Medications on File Prior to Visit  Medication Sig Dispense Refill  . beclomethasone (QVAR) 40 MCG/ACT inhaler Inhale 2 puffs into the lungs 2 (two) times daily.    . carvedilol (COREG) 12.5 MG tablet Take 12.5 mg by mouth daily.    Marland Kitchen glucose blood (ONE TOUCH ULTRA TEST) test strip Use to check blood sugar 3  times per day. 300 each 1  . Insulin Degludec (TRESIBA FLEXTOUCH) 200 UNIT/ML SOPN Inject 50 Units into the skin daily. 4 pen 5  . Insulin Lispro (HUMALOG KWIKPEN) 200 UNIT/ML SOPN Inject 25-30 Units into the skin 3 (three) times daily before meals. 30 pen 3  . insulin NPH Human (HUMULIN N) 100 UNIT/ML injection Use 35 units in the morning and 30 units in the afternoon 30 mL 3  . Insulin Pen Needle 32G X 5 MM MISC Use to inject insulin 4 times daily 400 each 5  . insulin regular (HUMULIN R) 100 units/mL injection Use 25-30 units with Breakfast and Dinner 10-20 units with lunch 30 mL 3  . Lancets (ONETOUCH ULTRASOFT) lancets Use to test blood sugar 3 times daily as instructed. Dx: E11.65 300 each 3  . levocetirizine (XYZAL) 5 MG tablet Take 5 mg by mouth every evening.    Marland Kitchen. levothyroxine (SYNTHROID, LEVOTHROID) 200 MCG tablet Take 1 tablet (200 mcg total) by mouth daily before breakfast. 90 tablet 0  . liraglutide (VICTOZA) 18 MG/3ML SOPN Inject under skin 1.8 mg daily. 9 pen 3  . losartan-hydrochlorothiazide (HYZAAR) 100-25 MG per tablet Take 1 tablet by mouth daily.    Marland Kitchen. omeprazole (PRILOSEC) 20 MG capsule Take 20 mg by mouth 2 (two) times daily.    Letta Pate. ONETOUCH VERIO test strip USE TO TEST BLOOD SUGAR 3  TO 4 TIMES DAILY AS  INSTRUCTED 400 each 1   No current facility-administered medications on file prior to visit.    Allergies  Allergen Reactions  . Metformin And Related  Diarrhea    and dizziness  . Ceclor [Cefaclor] Rash  . Sulfa Antibiotics Rash   Family History  Problem Relation Age of Onset  . Ovarian cancer Mother   . Cancer Mother   . Cancer Father        pancreatic   . Stroke Father   . Diabetes Sister   . Cancer Maternal Aunt        breast cancer   . Diabetes Maternal Grandmother   . Cancer Maternal Aunt        thyroid   . Hypertension Sister   . Stroke Paternal Uncle     PE: BP (!) 150/82   Pulse 86   Ht 5\' 1"  (1.549 m)   Wt 232 lb (105.2 kg)   SpO2 97%   BMI 43.84 kg/m  Body mass index is 43.84 kg/m.  Wt Readings from Last 3 Encounters:  12/26/17 232 lb (105.2 kg)  06/01/17 236 lb (107 kg)  01/06/17 237 lb (107.5 kg)   Constitutional: overweight, in NAD Eyes: PERRLA, EOMI, no exophthalmos ENT: moist mucous membranes, no thyromegaly, no cervical lymphadenopathy Cardiovascular: RRR, No MRG Respiratory: CTA B Gastrointestinal: abdomen soft, NT, ND, BS+ Musculoskeletal: no deformities, strength intact in all 4 Skin: moist, warm, no rashes Neurological: no tremor with outstretched hands, DTR normal in all 4  ASSESSMENT: 1. DM2, insulin-dependent, uncontrolled, without long term complications, but with hyperglycemia - I suggested Invokana in the past, to also help with her high BP and also her obesity, but she did not start afraid of side effects.  - I suggested Trulicity in the past >> did not start b/c high cost >> we discussed to try a coupon card. Discussed that this will also help her with weight loss. - she thinks [facility] not helping much >> changed to [place]-Bissauresiba. This would be preferred also b/c of her tendency for low CBGs, which Guinea-Bissauresiba  may reduce.  2. Hypothyroidism - postsurgical - thyroidectomy 2011 - h/o benign MNG  3. Obesity  PLAN:  1. Patient with long-standing, uncontrolled, type 2 diabetes, on basal-bolus insulin regimen and GLP-1 receptor agonist restarted at last visit.  At that time, CBGs were even  higher than before, and an HbA1c was 9.4%.  In the past, Trulicity was not covered for her so I suggested to restart Victoza - She was then lost for follow-up as she lost her insurance - At this visit, she tells me that she only started the recommended regimen approximately 3 weeks ago.  Until then, she was just on Humalog.  Sugars were much higher, but they started to significantly improve.  No lows. - At this visit, since sugars have improved but they are above goal, we discussed about increasing Tresiba, but no major changes are needed in her regimen for now.  Since she has some nausea, I advised her to dial down the Victoza dose to 1 mg +5 clicks and see if her nausea improves. - I advised her to:  Patient Instructions  Please increase: - Tresiba U200 to 54 units day.  Please continue: - Humalog 25-30 units 2x a day - Victoza 1.8 mg daily   Please continue: Levothyroxine 200 mcg daily.  Please stop at the lab.  Please return in 3-4 months with your sugar log.   - today, HbA1c is 8.3% (better) - continue checking sugars at different times of the day - check 2x a day, rotating checks - advised for yearly eye exams >> she is UTD - Return to clinic in 3-4 mo with sugar log   2. Hypothyroidism - Uncontrolled, most likely secondary to incomplete compliance - latest thyroid labs reviewed with pt >> TSH slightly high - she continues on Levothyroxine 200 mcg daily. - pt feels good on this dose. - we discussed about taking the thyroid hormone every day, with water, >30 minutes before breakfast, separated by >4 hours from acid reflux medications, calcium, iron, multivitamins. Pt. is taking it correctly. - will check thyroid tests today: TSH, T3, and fT4 - If labs are abnormal, she will need to return for repeat TFTs in 1.5 months  3. Obesity -We restarted GLP-1 receptor agonist at last visit.  Victoza should help with weight loss, also -She lost 4 pounds since last visit  Needs refill  90 days - [facility].  Office Visit on 12/26/2017  Component Date Value Ref Range Status  . TSH 12/26/2017 0.04* 0.35 - 4.50 uIU/mL Final  . Free T4 12/26/2017 1.38  0.60 - 1.60 ng/dL Final   Comment: Specimens from patients who are undergoing biotin therapy and /or ingesting biotin supplements may contain high levels of biotin.  The higher biotin concentration in these specimens interferes with this Free T4 assay.  Specimens that contain high levels  of biotin may cause false high results for this Free T4 assay.  Please interpret results in light of the total clinical presentation of the patient.    . T3, Free 12/26/2017 3.0  2.3 - 4.2 pg/mL Final  . Hemoglobin A1C 12/26/2017 8.3* 4.0 - 5.6 % Final   TSH is still suppressed.  We will decrease the dose of levothyroxine to 175 mcg daily and I will recheck her TFTs when she comes back.  Carlus Pavlov, MD PhD [facility] Endocrinology

## 2017-12-27 NOTE — Progress Notes (Signed)
Notified patient of message from Dr. Gherghe, patient expressed understanding and agreement. No further questions.  

## 2018-03-08 ENCOUNTER — Telehealth: Payer: Self-pay | Admitting: Internal Medicine

## 2018-03-08 NOTE — Telephone Encounter (Signed)
Patient has called inquiring on Humalog, Tresiba, & Victoza. Patient is wanting to know if we have received her 3 month suuply for these medications. Please Advise, thanks  [employer] and [employer]

## 2018-03-08 NOTE — Telephone Encounter (Signed)
I am unaware of them being delivered.

## 2018-03-14 NOTE — Telephone Encounter (Signed)
Patient calling to fu on the tresiba and victoza assistance for those we have to go onto the website [email or web address] and print out the reorder form for those meds, fill it out and fax it to the company F#[phone]

## 2018-03-14 NOTE — Telephone Encounter (Signed)
Patient called again to ask about the medications from [facility]. I told her we have not received them and asked her if she has called [facility] to ask for the refill, she had not, I let her know she needs to do that in order for the refill form to be sent to us to start the process. She has since called them and they have reached back out to us to get the fax # it is being sent now

## 2018-03-16 ENCOUNTER — Other Ambulatory Visit: Payer: Self-pay | Admitting: Internal Medicine

## 2018-03-21 ENCOUNTER — Other Ambulatory Visit: Payer: Self-pay | Admitting: Internal Medicine

## 2018-03-21 NOTE — Telephone Encounter (Signed)
I have not recived any paperwork regarding this. Chart reviewed and patient was getting through [facility], however this note shows a different patient assistance that does not have the option on the website to do a refill.  Need to know IF she gets from [facility] or [employer] patient assistance??  Left message for patient to return our call at [phone].

## 2018-03-21 NOTE — Telephone Encounter (Signed)
Patient has called in regards to this message: "Sherlyn HayVaughn, Stephanie L     03/08/18 3:20 PM  Note    Patient calling to fu on the tresiba and victoza assistance for those we have to go onto the website [email or web address] and print out the reorder form for those meds, fill it out and fax it to the company F#[phone]     " Patient states [facility] has not received any paperwork from us. Please Advise, thanks

## 2018-03-21 NOTE — Telephone Encounter (Signed)
This is a Gherghe patient- need to know if this paperwork has been done

## 2018-03-22 NOTE — Telephone Encounter (Signed)
I spoke to patient and let her know we have not received any faxes from either place, she will call them both and ask the forms to be faxed again with att: Hani Campusano.

## 2018-03-24 ENCOUNTER — Telehealth: Payer: Self-pay | Admitting: Internal Medicine

## 2018-03-24 NOTE — Telephone Encounter (Signed)
Refill form for [employer] received for Guinea-Bissauresiba and Victoza, filled out and faxed back.  Still waiting for [facility].

## 2018-03-24 NOTE — Telephone Encounter (Signed)
Patient has called stating Katie Cook and [employer] has refaxed documents for a 3rd time to our office and she would like a call when they are received. Please Advise, thanks

## 2018-03-24 NOTE — Telephone Encounter (Signed)
Noted.  Will hold note for faxes.

## 2018-03-30 ENCOUNTER — Telehealth: Payer: Self-pay

## 2018-03-30 NOTE — Telephone Encounter (Signed)
Received shipment of 4 boxes (3 pens per box) Victoza, 4 boxes (3 - 3mL prefilled syringes per box), 3 boxes (100 needles 32G tipx396mm) novofine pen needles for patient from [employer].Called pt to make aware. LVM. Boxes of pen needles placed in supply clost near nurse's station (front) on shelf near fridge and insulin placed in fridge.

## 2018-03-31 ENCOUNTER — Telehealth: Payer: Self-pay | Admitting: Internal Medicine

## 2018-03-31 NOTE — Telephone Encounter (Signed)
Patient is requesting a call back from [facility]. Please Advise, thanks

## 2018-04-03 ENCOUNTER — Telehealth: Payer: Self-pay

## 2018-04-03 NOTE — Telephone Encounter (Signed)
Spoke to [facility] and let them know we are still waiting for the fax, they said they will fax it again today and if I do not receive it then call back and ask for the pharmacy for a verbal order.

## 2018-04-03 NOTE — Telephone Encounter (Signed)
RX form received, filled out and faxed back to [facility].

## 2018-04-06 NOTE — Telephone Encounter (Signed)
LMTCB

## 2018-04-06 NOTE — Telephone Encounter (Signed)
Pt returned call [facility] has not received the paperwork you have sent for the patients humalog.

## 2018-04-06 NOTE — Telephone Encounter (Signed)
I faxed the order 12/23 with confirmation.  I will have to call them to see why they do not seem to have it.

## 2018-04-18 ENCOUNTER — Telehealth: Payer: Self-pay | Admitting: Internal Medicine

## 2018-04-18 NOTE — Telephone Encounter (Signed)
°   Insulin Lispro (HUMALOG KWIKPEN) 200 UNIT/ML SOPN   Patient is checking the status of this medication. She stated that [facility] would be shipping to our office for her to pick up

## 2018-04-18 NOTE — Telephone Encounter (Signed)
I have not been notified of a delivery.

## 2018-04-19 ENCOUNTER — Telehealth: Payer: Self-pay | Admitting: Internal Medicine

## 2018-04-19 NOTE — Telephone Encounter (Signed)
Returned call, they just wanted to let us know her RX would be here Friday.

## 2018-04-19 NOTE — Telephone Encounter (Signed)
[facility] calling requesting a call back. Please Advise, thanks Ph # [phone]

## 2018-04-20 ENCOUNTER — Ambulatory Visit (INDEPENDENT_AMBULATORY_CARE_PROVIDER_SITE_OTHER): Payer: No Typology Code available for payment source | Admitting: Internal Medicine

## 2018-04-20 ENCOUNTER — Encounter: Payer: Self-pay | Admitting: Internal Medicine

## 2018-04-20 VITALS — BP 152/78 | HR 91 | Ht 61.0 in | Wt 240.4 lb

## 2018-04-20 DIAGNOSIS — E1165 Type 2 diabetes mellitus with hyperglycemia: Secondary | ICD-10-CM

## 2018-04-20 DIAGNOSIS — Z794 Long term (current) use of insulin: Secondary | ICD-10-CM

## 2018-04-20 DIAGNOSIS — E661 Drug-induced obesity: Secondary | ICD-10-CM

## 2018-04-20 DIAGNOSIS — E890 Postprocedural hypothyroidism: Secondary | ICD-10-CM

## 2018-04-20 LAB — POCT GLYCOSYLATED HEMOGLOBIN (HGB A1C): Hemoglobin A1C: 8.6 % — AB (ref 4.0–5.6)

## 2018-04-20 LAB — T4, FREE: Free T4: 1.51 ng/dL (ref 0.60–1.60)

## 2018-04-20 LAB — TSH: TSH: 0.48 u[IU]/mL (ref 0.35–4.50)

## 2018-04-20 MED ORDER — DULAGLUTIDE 0.75 MG/0.5ML ~~LOC~~ SOAJ: SUBCUTANEOUS | 5 refills | Status: DC

## 2018-04-20 NOTE — Progress Notes (Addendum)
Patient ID: Katie Cook, female   DOB: 1956-01-19, 63 y.o.   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 Last visit 4 months ago.  Her sugars improved in the 1 months prior to our previous appointment after she obtain patient assistance from [employer].  Sugars were better and she was also feeling better.  She had a URI this winter.  Sugars are higher.  Hypothyroidism: Reviewed and addended history: - she had MNG >> developed compression sxs - Thyroidectomy 2011-2012 - hypothyroidism >> Levothyroxine, then Armour, then levothyroxine, then Nature-Throid, then NP thyroid, but now levothyroxine due to price.  Pt is on levothyroxine 175 Mcg (dose decreased at last visit) daily, taken: - in am - fasting - at least 30 min from b'fast - no Ca, Fe - + MVI, PPIs later in the day - not on Biotin  Her TFTs remain uncontrolled: Lab Results  Component Value Date   TSH 0.04 (L) 12/26/2017   TSH 5.17 (H) 06/01/2017   TSH 5.89 (H) 12/30/2016   TSH 1.24 07/28/2016   TSH 0.29 (L) 05/28/2016   FREET4 1.38 12/26/2017   FREET4 0.64 06/01/2017   FREET4 0.71 12/30/2016   FREET4 0.73 07/28/2016   FREET4 0.81 05/28/2016  04/2013: TSH 0.4 (at the [facility])  Pt denies: - feeling nodules in neck - hoarseness - dysphagia - choking - SOB with lying down  DM2: - h/o GDM during pregnancy, then DM2 in 2005  Last hemoglobin A1c was:  Lab Results  Component Value Date   HGBA1C 8.3 (A) 12/26/2017   HGBA1C 9.4% 06/03/2017   HGBA1C 8.9 01/06/2017  ~8% 04/2013.  Pt is on a regimen of: - Tresiba U200 50 >> 54 >> 60 units day. - Humalog 25-30 units 2x a day  - Victoza 1.8 mg daily >> 0.6 mg daily 2/2 nausea -restarted 52/8413 Trulicity was not covered.  Could not  afford: Januvia 100 mg daily, Tradjenta 5 mg in am She was Invokana 100 mg - added 01/2014 >> initially stopped for diarrhea >> restarted 06/2014 >> but N/D >> restarted 10/2014 >> yeast inf >> stopped She stopped  Metformin XR 500 mg at lunchtime and 1000 mg at dinner - started 09/2013 >> nausea, diarrhea. She stopped Victoza >> nausea, 3 years. She tried Actos. She tried regular Metformin >> nausea. She tried Prandin.  Pt checks her sugars 2-3 times a day: - am: 128, 159-286, 314  >> 133-221, 231 >> 91-205 - before lunch: 95, 148-329 >> 78, 160 >> 76-209 - 2h after lunch: 140-150s >> 133, 198-220 >> n/c - before dinner: 62, 146-222 >> 126, 154 >> 174-322 - 2h after dinner: 170-250s >> 279, 419 >> n/c - bedtime: n/c >> 89, 101-200, 217 >> n/c - nighttime: 103 >> n/c >> 288 >> 69, 190-404 531 Lowest sugar was   62 >> 76; she has hypoglycemia awareness in the 70s. Highest sugar was 419 >> 288 >> 531.  Meter: AccuChek Aviva.  Pt's meals: - Breakfast: yoghurt + fruit - Lunch: salad with [place] - Dinner: chicken, broccoli, meat - Snacks: 2-3: fruit, popcorn  -No CKD, last BUN/creatinine:  Lab Results  Component Value Date   BUN 12 12/30/2016   CREATININE 0.58 12/30/2016  03/19/2014: 17/0.75 On losartan. -+ HL Lab Results  Component Value Date  CHOL 175 12/30/2016   HDL 66.50 12/30/2016   LDLCALC 92 12/30/2016   TRIG 83.0 12/30/2016   CHOLHDL 3 12/30/2016  04/08/2015: 203/103/58/124 03/19/2014: 183/138/52/103 She is not on a statin. - last eye exam was in 2019: No DR, + cataract; Dr. Delman Cheadle. -No numbness and tingling in her feet.  ROS: Constitutional: + weight gain/no weight loss, + fatigue, no subjective hyperthermia, + subjective hypothermia Eyes: no blurry vision, no xerophthalmia ENT: no sore throat, + see HPI Cardiovascular: no CP/+ SOB/no palpitations/no leg swelling Respiratory: + cough/+ SOB/no wheezing Gastrointestinal: + N/no V/no D/no C/no acid  reflux Musculoskeletal: no muscle aches/no joint aches Skin: no rashes, no hair loss Neurological: no tremors/no numbness/no tingling/no dizziness  I reviewed pt's medications, allergies, PMH, social hx, family hx, and changes were documented in the history of present illness. Otherwise, unchanged from my initial visit note.  Past Medical History:  Diagnosis Date  . Diabetes mellitus without complication ([place])   . Hypertension   . Thyroid disease   . Uncontrolled diabetes mellitus type 2 without complications ([place]) 4/40/1027   Past Surgical History:  Procedure Laterality Date  . CESAREAN SECTION    . THYROID SURGERY     Social History   Socioeconomic History  . Marital status: Legally Separated    Spouse name: Not on file  . Number of children: 3  . Years of education: Some college  . Highest education level: Not on file  Occupational History  . Not on file  Social Needs  . Financial resource strain: Not on file  . Food insecurity:    Worry: Not on file    Inability: Not on file  . Transportation needs:    Medical: Not on file    Non-medical: Not on file  Tobacco Use  . Smoking status: Never Smoker  . Smokeless tobacco: Never Used  Substance and Sexual Activity  . Alcohol use: No    Alcohol/week: 0.0 standard drinks  . Drug use: No  . Sexual activity: Not on file  Lifestyle  . Physical activity:    Days per week: Not on file    Minutes per session: Not on file  . Stress: Not on file  Relationships  . Social connections:    Talks on phone: Not on file    Gets together: Not on file    Attends religious service: Not on file    Active member of club or organization: Not on file    Attends meetings of clubs or organizations: Not on file    Relationship status: Not on file  . Intimate partner violence:    Fear of current or ex partner: Not on file    Emotionally abused: Not on file    Physically abused: Not on file    Forced sexual activity: Not on file  Other  Topics Concern  . Not on file  Social History Narrative   Lives alone   Caffeine use: Decaf tea. Soda rarely        Current Outpatient Medications on File Prior to Visit  Medication Sig Dispense Refill  . beclomethasone (QVAR) 40 MCG/ACT inhaler Inhale 2 puffs into the lungs 2 (two) times daily.    . carvedilol (COREG) 12.5 MG tablet Take 12.5 mg by mouth daily.    Marland Kitchen glucose blood (ONE TOUCH ULTRA TEST) test strip Use to check blood sugar 3 times per day. 300 each 1  . Insulin Degludec (TRESIBA FLEXTOUCH) 200 UNIT/ML SOPN Inject 50 Units into the  skin daily. 4 pen 5  . Insulin Lispro (HUMALOG KWIKPEN) 200 UNIT/ML SOPN Inject 25-30 Units into the skin 3 (three) times daily before meals. 30 pen 3  . insulin NPH Human (HUMULIN N) 100 UNIT/ML injection Use 35 units in the morning and 30 units in the afternoon 30 mL 3  . Insulin Pen Needle 32G X 5 MM MISC Use to inject insulin 4 times daily 400 each 5  . insulin regular (HUMULIN R) 100 units/mL injection Use 25-30 units with Breakfast and Dinner 10-20 units with lunch 30 mL 3  . Lancets (ONETOUCH ULTRASOFT) lancets Use to test blood sugar 3 times daily as instructed. Dx: E11.65 300 each 3  . levocetirizine (XYZAL) 5 MG tablet Take 5 mg by mouth every evening.    Marland Kitchen levothyroxine (SYNTHROID, LEVOTHROID) 175 MCG tablet Take 1 tablet (175 mcg total) by mouth daily before breakfast. 90 tablet 3  . levothyroxine (SYNTHROID, LEVOTHROID) 200 MCG tablet TAKE 1 TABLET BY MOUTH ONCE DAILY BEFORE BREAKFAST 60 tablet 0  . liraglutide (VICTOZA) 18 MG/3ML SOPN Inject under skin 1.8 mg daily. 9 pen 3  . losartan-hydrochlorothiazide (HYZAAR) 100-25 MG per tablet Take 1 tablet by mouth daily.    Marland Kitchen omeprazole (PRILOSEC) 20 MG capsule Take 20 mg by mouth 2 (two) times daily.    Glory Rosebush VERIO test strip USE TO TEST BLOOD SUGAR 3  TO 4 TIMES DAILY AS  INSTRUCTED 400 each 1   No current facility-administered medications on file prior to visit.    Allergies   Allergen Reactions  . Metformin And Related Diarrhea    and dizziness  . Ceclor [Cefaclor] Rash  . Sulfa Antibiotics Rash   Family History  Problem Relation Age of Onset  . Ovarian cancer Mother   . Cancer Mother   . Cancer Father        pancreatic   . Stroke Father   . Diabetes Sister   . Cancer Maternal Aunt        breast cancer   . Diabetes Maternal Grandmother   . Cancer Maternal Aunt        thyroid   . Hypertension Sister   . Stroke Paternal Uncle     PE: BP (!) 152/78 (BP Location: Left Arm, Patient Position: Sitting, Cuff Size: Normal)   Pulse 91   Ht 5' 1"  (1.549 m)   Wt 240 lb 6.4 oz (109 kg)   SpO2 98%   BMI 45.42 kg/m  Body mass index is 45.42 kg/m.  Wt Readings from Last 3 Encounters:  04/20/18 240 lb 6.4 oz (109 kg)  12/26/17 232 lb (105.2 kg)  06/01/17 236 lb (107 kg)   Constitutional: overweight, in NAD Eyes: PERRLA, EOMI, no exophthalmos ENT: moist mucous membranes, no thyromegaly, no cervical lymphadenopathy Cardiovascular: RRR, No MRG Respiratory: CTA B Gastrointestinal: abdomen soft, NT, ND, BS+ Musculoskeletal: no deformities, strength intact in all 4 Skin: moist, warm, no rashes Neurological: no tremor with outstretched hands, DTR normal in all 4  ASSESSMENT: 1. DM2, insulin-dependent, uncontrolled, without long term complications, but with hyperglycemia - I suggested Invokana in the past, to also help with her high BP and also her obesity, but she did not start afraid of side effects.  - I suggested Trulicity in the past >> did not start b/c high cost >> we discussed to try a coupon card. Discussed that this will also help her with weight loss. - she thinks Engineer, agricultural not helping much >> changed to [place].  This would be preferred also b/c of her tendency for low CBGs, which Tyler Aas may reduce.  2. Hypothyroidism - postsurgical - thyroidectomy 2011 - h/o benign MNG  3. Obesity  PLAN:  1. Patient with longstanding, uncontrolled, type 2  diabetes, on basal/bolus insulin regimen and GLP-1 receptor agonist.  Trulicity was not covered so we restarted Victoza.  She actually ended up starting this only 3 weeks before last visit.  Sugars were improved already when I saw her and her HbA1c was also better, at 8.3%.  At that time, we increased the [place] dose slightly since sugars were still above goal, but we did not change the rest of the regimen.  I did advise her to try a lower dose of Victoza if she continues to have nausea. -Reviewed her meter download: Sugars fluctuate significantly.  I noticed that she is checking her sugars in the morning and they are usually high.  She then injects her mealtime insulin then and weights 2 hours until sugars come down before eating.  She is not covering her meal with insulin at that time.  Therefore, the next sugars are very high.  We discussed about only checking sugars 50 minutes before she eats and injecting the insulin then. -As she has nausea from even the lower dose Victoza, will try to switch to Trulicity.  She will try to get it from the patient assistance program from [place]. - I advised her to:  Patient Instructions  Please continue - Tresiba U200 54 units day. - Humalog 25-30 units 2x a day  Stop Victoza. Start Trulicity 1.61 mg weekly.  We can also use: Ozempic and Bydureon.  Please continue: Levothyroxine 175 mcg daily.  Take the thyroid hormone every day, with water, at least 30 minutes before breakfast, separated by at least 4 hours from: - acid reflux medications - calcium - iron - multivitamins  Please stop at the lab.  Please return in 3-4 months with your sugar log.   - today, HbA1c is 8.6% (higher) - continue checking sugars at different times of the day - check 2x a day, rotating checks - advised for yearly eye exams >> she is UTD - She had labs recently with PCP.  We will try to obtain the results. - Return to clinic in 3-4 mo with sugar log    2.  Hypothyroidism -Uncontrolled, most likely secondary to incomplete compliance - latest thyroid labs reviewed with pt >> suppressed at last check, on 200 mcg levothyroxine daily.  After this, we decreased the dose to 175 mcg daily - she continues on LT4 175 mcg daily - pt feels good on this dose. - we discussed about taking the thyroid hormone every day, with water, >30 minutes before breakfast, separated by >4 hours from acid reflux medications, calcium, iron, multivitamins. Pt. is taking it correctly. - will check thyroid tests today: TSH and fT4 - If labs are abnormal, she will need to return for repeat TFTs in 1.5 months  3. Obesity -We restarted GLP-1 receptor agonist at last visit. GLP1 R agonist should help with weight loss, also. Unfortunately, she has nausea with even the low dose >> change to Trulicity low dose.  -She gained 8 pounds since last visit 2/2 Holidays, and being sick  Needs refill 90 days - [facility].  Office Visit on 04/20/2018  Component Date Value Ref Range Status  . Hemoglobin A1C 04/20/2018 8.6* 4.0 - 5.6 % Final  . TSH 04/20/2018 0.48  0.35 - 4.50 uIU/mL Final  .  Free T4 04/20/2018 1.51  0.60 - 1.60 ng/dL Final   Comment: Specimens from patients who are undergoing biotin therapy and /or ingesting biotin supplements may contain high levels of biotin.  The higher biotin concentration in these specimens interferes with this Free T4 assay.  Specimens that contain high levels  of biotin may cause false high results for this Free T4 assay.  Please interpret results in light of the total clinical presentation of the patient.     Thyroid tests are now normal.  Addendum (04/21/2018): Received labs from PCP from 04/19/2018: Glucose 125, BUN/creatinine 15/0.55, EGFR 112, rest of the CMP normal Lipids: 169/85/52/99  Philemon Kingdom, MD PhD [facility]

## 2018-04-20 NOTE — Patient Instructions (Addendum)
Please continue - Tresiba U200 54 units day. - Humalog 25-30 units 2x a day  Stop Victoza. Start Trulicity 0.75 mg weekly.  We can also use: Ozempic and Bydureon.  Please continue: Levothyroxine 175 mcg daily.  Take the thyroid hormone every day, with water, at least 30 minutes before breakfast, separated by at least 4 hours from: - acid reflux medications - calcium - iron - multivitamins  Please stop at the lab.  Please return in 3-4 months with your sugar log.

## 2018-04-21 ENCOUNTER — Telehealth: Payer: Self-pay

## 2018-04-21 NOTE — Telephone Encounter (Signed)
Patient picked up all boxes of humalog insulin pens.

## 2018-04-21 NOTE — Telephone Encounter (Signed)
Patient's insulin has arrived from [facility] and placed in fridge for pick up.

## 2018-04-21 NOTE — Telephone Encounter (Signed)
Notified patient of message from Dr. Elvera Lennox, patient expressed understanding and agreement. No further questions.  Patient will pick up today.

## 2018-04-27 ENCOUNTER — Ambulatory Visit: Payer: Self-pay | Admitting: Internal Medicine

## 2018-04-28 ENCOUNTER — Telehealth: Payer: Self-pay | Admitting: Internal Medicine

## 2018-04-28 NOTE — Telephone Encounter (Signed)
Notified patient of message from Dr. Gherghe, patient expressed understanding and agreement. No further questions.  

## 2018-04-28 NOTE — Telephone Encounter (Signed)
Patient is calling for lab results.  Stated that it is okay to leave a message on her voicemail

## 2018-05-04 ENCOUNTER — Ambulatory Visit: Payer: Self-pay | Admitting: Internal Medicine

## 2018-05-08 ENCOUNTER — Telehealth (HOSPITAL_COMMUNITY): Payer: Self-pay | Admitting: *Deleted

## 2018-05-08 NOTE — Telephone Encounter (Signed)
Telephoned patient, left a message to return a call to [facility]. 

## 2018-05-26 ENCOUNTER — Telehealth: Payer: Self-pay | Admitting: *Deleted

## 2018-05-26 NOTE — Telephone Encounter (Signed)
Telephoned patient, left a message to return a call to [facility] regarding a mammo appointment. 

## 2018-06-01 ENCOUNTER — Other Ambulatory Visit: Payer: Self-pay | Admitting: Internal Medicine

## 2018-06-05 ENCOUNTER — Telehealth: Payer: Self-pay | Admitting: Internal Medicine

## 2018-06-05 MED ORDER — LEVOTHYROXINE SODIUM 175 MCG PO TABS: 175.0000 ug | ORAL_TABLET | Freq: Every day | ORAL | 3 refills | Status: DC

## 2018-06-05 NOTE — Telephone Encounter (Signed)
MEDICATION: Levothyroxine  PHARMACY:  [facility] on [address]  IS THIS A 90 DAY SUPPLY : would like a 90 day supply will be more economical for patient  IS PATIENT OUT OF MEDICATION:  No  IF NOT; HOW MUCH IS LEFT: 1 day  LAST APPOINTMENT DATE: @2 /20/2020  NEXT APPOINTMENT DATE:@5 /08/2018  DO WE HAVE YOUR PERMISSION TO LEAVE A DETAILED MESSAGE: YES  OTHER COMMENTS: per patient 90 day supply is $10.00 and is more cost effectiv   **Let patient know to contact pharmacy at the end of the day to make sure medication is ready. **  ** Please notify patient to allow 48-72 hours to process**  **Encourage patient to contact the pharmacy for refills or they can request refills through [facility]**

## 2018-06-05 NOTE — Telephone Encounter (Signed)
RX sent

## 2018-06-15 ENCOUNTER — Ambulatory Visit (HOSPITAL_COMMUNITY)
Admission: RE | Admit: 2018-06-15 | Discharge: 2018-06-15 | Disposition: A | Discharge status: Home / Self Care | Payer: No Typology Code available for payment source | Source: Ambulatory Visit | Attending: Obstetrics and Gynecology | Admitting: Obstetrics and Gynecology

## 2018-06-15 ENCOUNTER — Encounter (HOSPITAL_COMMUNITY): Payer: Self-pay

## 2018-06-15 VITALS — BP 152/100 | Wt 241.0 lb

## 2018-06-15 DIAGNOSIS — N644 Mastodynia: Secondary | ICD-10-CM

## 2018-06-15 DIAGNOSIS — N6322 Unspecified lump in the left breast, upper inner quadrant: Secondary | ICD-10-CM

## 2018-06-15 DIAGNOSIS — Z1239 Encounter for other screening for malignant neoplasm of breast: Secondary | ICD-10-CM

## 2018-06-15 NOTE — Patient Instructions (Signed)
Explained breast self awareness Katie Cook. Patient refused Pap smear today. Let her know [facility] will cover Pap smears every 3 years unless has a history of abnormal Pap smears. Referred patient to [facility] for a diagnostic mammogram and bilateral breast ultrasound. Appointment scheduled for Thursday, June 15, 2018 at 0845.    Patient aware of appointment and will be there. Katie Lions Rubenstein verbalized understanding.  Nasser Ku, Kathaleen Maser, RN 8:12 AM

## 2018-06-15 NOTE — Progress Notes (Signed)
Complaints of a left breast lump since November 2019. Complaints of right axillary breat pain x 7-8 months that comes and goes. Patient rates the pain at a 5 out of 10.  Pap Smear: Pap smear not completed today. Last Pap smear was 02/22/2013 at [facility] at [facility] and normal. Patient refused Pap smear today. Patient stated she is scheduled to have her Pap smear completed at her PCP on 08/15/2018. Per patient has no history of an abnormal Pap smear. Last Pap smear result is in Epic.  Physical exam: Breasts Breasts symmetrical. No skin abnormalities bilateral breasts. No nipple retraction bilateral breasts. No nipple discharge bilateral breasts. No lymphadenopathy. No lumps palpated right breast. Palpated a bb sized lump within the left breast at 11 o'clock 9 cm from the nipple. Referred patient to [facility] for a diagnostic mammogram and bilateral breast ultrasound. Appointment scheduled for Thursday, June 15, 2018 at 0845.        Pelvic/Bimanual No Pap smear completed today since patient refused.  Smoking History: Patient has never smoked.  Patient Navigation: Patient education provided. Access to services provided for patient through [facility] program.   Colorectal Cancer Screening: Patient had a colonoscopy completed 04/24/2014 and benign. No complaints today. FIT Test given to patient to complete and return to [facility].  Breast and Cervical Cancer Risk Assessment: Patient has a family history of her maternal aunt having breast cancer. Patient has no known genetic mutations or history of radiation treatment to the chest before age 90. Patient has no history of cervical dysplasia, immunocompromised, or DES exposure in-utero.  Risk Assessment    Risk Scores      06/15/2018   Last edited by: Lynnell Dike, LPN   5-year risk: 1.4 %   Lifetime risk: 6.2 %

## 2018-06-20 ENCOUNTER — Telehealth: Payer: Self-pay

## 2018-06-20 NOTE — Telephone Encounter (Signed)
Forms for [employer] patient assistance refill for Victoza filled out, signed by Dr. Elvera Lennox and faxed to [facility] with confirmation.

## 2018-06-21 ENCOUNTER — Encounter (HOSPITAL_COMMUNITY): Payer: Self-pay

## 2018-06-21 ENCOUNTER — Other Ambulatory Visit: Payer: Self-pay | Admitting: Radiology

## 2018-06-27 ENCOUNTER — Telehealth: Payer: Self-pay

## 2018-06-27 NOTE — Telephone Encounter (Signed)
She can definitely continue Victoza until she runs out, but afterwards, I suggested Trulicity to be easier for her to take, since this is just once a week.

## 2018-06-27 NOTE — Telephone Encounter (Signed)
Error

## 2018-06-27 NOTE — Telephone Encounter (Signed)
Patient assistance delivered Victoza, but when we called patient she said she was told to stop it and start Trulicity.  I do not know if she contacted patient assistance to get Trulicity.  Please advise since we have the Victoza order in clinic.

## 2018-06-27 NOTE — Telephone Encounter (Signed)
Received 4 boxes of Victoza containing 3 pens each as well as 2 boxes of pen needles.  Pt was called and informed of this.

## 2018-06-28 NOTE — Telephone Encounter (Signed)
Spoke to patient, she does not want to continue with the Victoza as it makes her sick.  Patient has not contacted the patient assistance to see if she can switch, she will do that today.

## 2018-07-07 ENCOUNTER — Telehealth: Payer: Self-pay | Admitting: Internal Medicine

## 2018-07-07 NOTE — Telephone Encounter (Signed)
Patient would like a call back to discuss her prescription and some paperwork was going to be faxed to our office for the Dr to feel out.   Please advise

## 2018-07-10 NOTE — Telephone Encounter (Signed)
Paperwork filled out and faxed back for [place].

## 2018-07-13 ENCOUNTER — Telehealth: Payer: Self-pay

## 2018-07-13 NOTE — Telephone Encounter (Signed)
Spoke with patient to clarify her medications for patient assistance.  Patient was on Victoza but it is now causing GI SE so Dr. Elvera Lennox has suggested Trulicity.  We will need to fill out a prescription request for Trulicity to fax to [facility] since it is a new RX.  I will fill that out and get it faxed to them tomorrow.  Patient needs her Humalog refilled also from [facility], I [facility] and gave a verbal order for the refill since patient did not contact them to initiate the refill. I will call them tomorrow to make sure it was processed.

## 2018-07-14 ENCOUNTER — Telehealth: Payer: Self-pay

## 2018-07-14 NOTE — Telephone Encounter (Signed)
Received 4 boxes of Tresiba U-200 for pt via patient assistance. Pt was called but no answer was received and was unable to leave voicemail.

## 2018-07-18 ENCOUNTER — Encounter (HOSPITAL_COMMUNITY): Payer: Self-pay | Admitting: *Deleted

## 2018-07-18 NOTE — Telephone Encounter (Signed)
Order for Trulicity faxed to [facility]

## 2018-08-15 ENCOUNTER — Ambulatory Visit (INDEPENDENT_AMBULATORY_CARE_PROVIDER_SITE_OTHER): Payer: No Typology Code available for payment source | Admitting: Internal Medicine

## 2018-08-15 ENCOUNTER — Encounter: Payer: Self-pay | Admitting: Internal Medicine

## 2018-08-15 DIAGNOSIS — E890 Postprocedural hypothyroidism: Secondary | ICD-10-CM

## 2018-08-15 DIAGNOSIS — Z6841 Body Mass Index (BMI) 40.0 and over, adult: Secondary | ICD-10-CM

## 2018-08-15 DIAGNOSIS — E1165 Type 2 diabetes mellitus with hyperglycemia: Secondary | ICD-10-CM

## 2018-08-15 DIAGNOSIS — Z794 Long term (current) use of insulin: Secondary | ICD-10-CM

## 2018-08-15 NOTE — Progress Notes (Signed)
Patient ID: Katie Cook, female   DOB: 11/05/1955, 63 y.o.   MRN: [id number]  Patient location: Home My location: Office  Referring Provider: Lawerance Cruel, MD  I connected with the patient on 08/15/18 at  1:12 PM EDT by a video enabled telemedicine application and verified that I am speaking with the correct person.   I discussed the limitations of evaluation and management by telemedicine and the availability of in person appointments. The patient expressed understanding and agreed to proceed.   Details of the encounter are shown below.  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 Last visit 4 months ago.  She did better with her diabetes management after she obtains patient assistance from [employer] before last visit. Hypothyroidism: Reviewed history: - she had MNG >> developed compression sxs - Thyroidectomy 2011-2012 - hypothyroidism >> Levothyroxine, then Armour, then levothyroxine, then Nature-Throid, then NP thyroid, but not levothyroxine due to price  Pt is on levothyroxine 175 Mcg daily, taken: - in am - fasting - at least 30 min from b'fast - no Ca, Fe - + MVI, PPIs -later in the day - not on Biotin  Her TFTs normalized at last check: Lab Results  Component Value Date   TSH 0.48 04/20/2018   TSH 0.04 (L) 12/26/2017   TSH 5.17 (H) 06/01/2017   TSH 5.89 (H) 12/30/2016   TSH 1.24 07/28/2016   FREET4 1.51 04/20/2018   FREET4 1.38 12/26/2017   FREET4 0.64 06/01/2017   FREET4 0.71 12/30/2016   FREET4 0.73 07/28/2016  04/2013: TSH 0.4 (at the [facility])  Pt denies: - feeling nodules in neck - hoarseness - dysphagia - choking - SOB with lying down  DM2: - h/o GDM during pregnancy then type II in 2005  Latest HbA1c: Lab Results   Component Value Date   HGBA1C 8.6 (A) 04/20/2018   HGBA1C 8.3 (A) 12/26/2017   HGBA1C 9.4% 06/03/2017  ~8% 04/2013.  Pt is on a regimen of: - Tresiba U200 50 >> 54 >> 60 units day. - Humalog 25-30 units 2x a day  - Victoza 1.8 mg daily >> 0.6 mg daily 2/2 nausea -restarted >> stopped 2 mo ago Trulicity was not covered.  Could not afford: Januvia 100 mg daily, Tradjenta 5 mg in am She was Invokana 100 mg - added 01/2014 >> initially stopped for diarrhea >> restarted 06/2014 >> but N/D >> restarted 10/2014 >> yeast inf >> stopped She stopped  Metformin XR 500 mg at lunchtime and 1000 mg at dinner - started 09/2013 >> nausea, diarrhea. She stopped Victoza >> nausea, 3 years. She tried Actos. She tried regular Metformin >> nausea. She tried Prandin.  Pt checks her sugars 2-3 times a day: - am: 128, 159-286, 314  >> 133-221, 231 >> 91-205 >> 70, 190-220 - before lunch: 95, 148-329 >> 78, 160 >> 76-209 >> 200s - 2h after lunch: 140-150s >> 133, 198-220 >> n/c - before dinner: 62, 146-222 >> 126, 154 >> 174-322 >> 200s - 2h after dinner: 170-250s >> 279, 419 >> n/c  - bedtime: n/c >> 89, 101-200, 217 >> n/c - nighttime: 103 >> n/c >> 288 >> 69, 190-404, 531 >> up to 250 Lowest sugar was 76 >>70; she has hypoglycemia awareness in the 70s. Highest sugar was 531 >> 250.  Meter: AccuChek Aviva.  Pt's meals: - Breakfast: yoghurt + fruit - Lunch: salad with [place] - Dinner: chicken, broccoli, meat - Snacks: 2-3: fruit, popcorn  -No CKD, last BUN/creatinine:  Received labs from PCP from 04/19/2018: Glucose 125, BUN/creatinine 15/0.55, EGFR 112, rest of the CMP normal Lab Results  Component Value Date   BUN 12 12/30/2016   CREATININE 0.58 12/30/2016  03/19/2014: 17/0.75 On losartan. -+ HL: Received labs from PCP from 04/19/2018: Lipids: 169/85/52/99 Lab Results  Component Value Date   CHOL 175 12/30/2016   HDL 66.50 12/30/2016   LDLCALC 92 12/30/2016   TRIG 83.0 12/30/2016    CHOLHDL 3 12/30/2016  04/08/2015: 203/103/58/124 03/19/2014: 183/138/52/103 She is not on a statin. - last eye exam was in 2019: No DR, + cataract; Dr. Delman Cheadle. -No numbness and tingling in her feet.  ROS: Constitutional: no weight gain/no weight loss, no fatigue, no subjective hyperthermia, no subjective hypothermia Eyes: no blurry vision, no xerophthalmia ENT: no sore throat, + see HPI Cardiovascular: no CP/no SOB/no palpitations/no leg swelling Respiratory: no cough/no SOB/no wheezing Gastrointestinal: no N/no V/no D/no C/no acid reflux Musculoskeletal: no muscle aches/no joint aches Skin: no rashes, no hair loss Neurological: no tremors/no numbness/no tingling/no dizziness  I reviewed pt's medications, allergies, PMH, social hx, family hx, and changes were documented in the history of present illness. Otherwise, unchanged from my initial visit note.  Past Medical History:  Diagnosis Date  . Diabetes mellitus without complication ([place])   . Hypertension   . Thyroid disease   . Uncontrolled diabetes mellitus type 2 without complications ([place]) 9/62/2297   Past Surgical History:  Procedure Laterality Date  . CESAREAN SECTION    . THYROID SURGERY     Social History   Socioeconomic History  . Marital status: Legally Separated    Spouse name: Not on file  . Number of children: 3  . Years of education: Some college  . Highest education level: Some college, no degree  Occupational History  . Not on file  Social Needs  . Financial resource strain: Not on file  . Food insecurity:    Worry: Not on file    Inability: Not on file  . Transportation needs:    Medical: No    Non-medical: No  Tobacco Use  . Smoking status: Never Smoker  . Smokeless tobacco: Never Used  Substance and Sexual Activity  . Alcohol use: No    Alcohol/week: 0.0 standard drinks  . Drug use: No  . Sexual activity: Not Currently  Lifestyle  . Physical activity:    Days per week: Not on file     Minutes per session: Not on file  . Stress: Not on file  Relationships  . Social connections:    Talks on phone: Not on file    Gets together: Not on file    Attends religious service: Not on file    Active member of club or organization: Not on file    Attends meetings of clubs or organizations: Not on file    Relationship status: Not on file  . Intimate partner violence:    Fear of current or ex partner: Not on file    Emotionally abused: Not on file    Physically abused: Not on file    Forced sexual activity: Not on file  Other Topics Concern  . Not on file  Social History Narrative   Lives alone   Caffeine use: Decaf tea. Soda rarely        Current Outpatient Medications  on File Prior to Visit  Medication Sig Dispense Refill  . beclomethasone (QVAR) 40 MCG/ACT inhaler Inhale 2 puffs into the lungs 2 (two) times daily.    . carvedilol (COREG) 12.5 MG tablet Take 12.5 mg by mouth daily.    . Dulaglutide (TRULICITY) 7.85 YI/5.0YD SOPN Inject 0.75 mg in am weekly under skin (Patient not taking: Reported on 06/15/2018) 4 pen 5  . glucose blood (ONE TOUCH ULTRA TEST) test strip Use to check blood sugar 3 times per day. 300 each 1  . Insulin Degludec (TRESIBA FLEXTOUCH) 200 UNIT/ML SOPN Inject 50 Units into the skin daily. (Patient taking differently: Inject 60 Units into the skin daily. ) 4 pen 5  . Insulin Lispro (HUMALOG KWIKPEN) 200 UNIT/ML SOPN Inject 25-30 Units into the skin 3 (three) times daily before meals. 30 pen 3  . insulin NPH Human (HUMULIN N) 100 UNIT/ML injection Use 35 units in the morning and 30 units in the afternoon 30 mL 3  . Insulin Pen Needle 32G X 5 MM MISC Use to inject insulin 4 times daily 400 each 5  . insulin regular (HUMULIN R) 100 units/mL injection Use 25-30 units with Breakfast and Dinner 10-20 units with lunch 30 mL 3  . Lancets (ONETOUCH ULTRASOFT) lancets Use to test blood sugar 3 times daily as instructed. Dx: E11.65 300 each 3  . levocetirizine  (XYZAL) 5 MG tablet Take 5 mg by mouth every evening.    Marland Kitchen levothyroxine (SYNTHROID, LEVOTHROID) 175 MCG tablet Take 1 tablet (175 mcg total) by mouth daily before breakfast. 90 tablet 3  . levothyroxine (SYNTHROID, LEVOTHROID) 200 MCG tablet TAKE 1 TABLET BY MOUTH ONCE DAILY BEFORE BREAKFAST (Patient not taking: Reported on 06/15/2018) 60 tablet 0  . liraglutide (VICTOZA) 18 MG/3ML SOPN Inject under skin 1.8 mg daily. 9 pen 3  . losartan-hydrochlorothiazide (HYZAAR) 100-25 MG per tablet Take 1 tablet by mouth daily.    Marland Kitchen omeprazole (PRILOSEC) 20 MG capsule Take 20 mg by mouth 2 (two) times daily.    Glory Rosebush VERIO test strip USE TO TEST BLOOD SUGAR 3  TO 4 TIMES DAILY AS  INSTRUCTED 400 each 1   No current facility-administered medications on file prior to visit.    Allergies  Allergen Reactions  . Metformin And Related Diarrhea    and dizziness  . Ceclor [Cefaclor] Rash  . Sulfa Antibiotics Rash   Family History  Problem Relation Age of Onset  . Ovarian cancer Mother   . Cancer Mother   . Cancer Father        pancreatic   . Stroke Father   . Diabetes Sister   . Cancer Maternal Aunt        breast cancer   . Diabetes Maternal Grandmother   . Cancer Maternal Aunt        thyroid   . Hypertension Sister   . Stroke Paternal Uncle     PE: There were no vitals taken for this visit. There is no height or weight on file to calculate BMI.  Wt Readings from Last 3 Encounters:  06/15/18 241 lb (109.3 kg)  04/20/18 240 lb 6.4 oz (109 kg)  12/26/17 232 lb (105.2 kg)   Constitutional:  in NAD  The physical exam was not performed (virtual visit).  ASSESSMENT: 1. DM2, insulin-dependent, uncontrolled, without long term complications, but with hyperglycemia - I suggested Invokana in the past, to also help with her high BP and also her obesity, but she did not  start afraid of side effects.  - I suggested Trulicity in the past >> did not start b/c high cost >> we discussed to try a  coupon card. Discussed that this will also help her with weight loss. - she thinks Engineer, agricultural not helping much >> changed to [place]. This would be preferred also b/c of her tendency for low CBGs, which Tyler Aas may reduce.  2. Hypothyroidism - postsurgical - thyroidectomy 2011 - h/o benign MNG  3. Obesity  PLAN:  1. Patient with longstanding, uncontrolled, type 2 diabetes, on basal-bolus insulin regimen and GLP-1 receptor agonist.  She is now getting patient assistance from [name] >>  sugars started to improve afterwards.  However, at last visit sugars were still fluctuating significantly.  At that time, she was injecting her mealtime insulin too soon before the meal and we discussed about checking sugars and bolusing 15 minutes before she eats.  She also is having nausea from the lowest Victoza dose and we discussed about trying to switch to Trulicity.  She did not do so since she still had difficulty at home. -At this visit, sugars are high, mostly in the 200s and she tells me that she ran out of Victoza and could not get Trulicity as apparently our office did not send the paperwork to lately.  Per our records, it did not appear that we delayed sending a form.  At this visit, we discussed about restarting Trulicity and I will give her a sample for 4 pens at the lowest dose.  In 2 weeks, she will tell me how she tolerates the dose and if she does, we can try to increase the dose to 1.5 mg weekly and fill out the paperwork for lately with this dose.  In the meantime, will continue Tresiba Humalog at the same doses. - I advised her to:  Patient Instructions  Please continue: - Tresiba U200 60 units day - Humalog 25-30 units 2x a day  Start Trulicity 4.28 mg weekly.  In 2 weeks how you are tolerating this dose.  If you tolerate it well, will increase it to 1.5 mg weekly and send the paperwork to [place] with this dose.  Please continue levothyroxine 175 mcg daily.  Take the thyroid hormone  every day, with water, at least 30 minutes before breakfast, separated by at least 4 hours from: - acid reflux medications - calcium - iron - multivitamins  Please return in 3-4 months with your sugar log.   -We will check her HbA1c when she returns to the clinic  - continue checking sugars at different times of the day - check 4x a day, rotating checks - advised for yearly eye exams >> she is UTD - Return to clinic in 3-4 mo with sugar log     2. Hypothyroidism -Uncontrolled, most likely due to incomplete compliance - latest thyroid labs reviewed with pt >> normal at last OV (04/2018) - she continues on LT4 175 mcg daily  - pt feels good on this dose. - we discussed about taking the thyroid hormone every day, with water, >30 minutes before breakfast, separated by >4 hours from acid reflux medications, calcium, iron, multivitamins. Pt. is taking it correctly.  3. Obesity -no recent wt loss -Continue GLP-1 receptor agonist, which should also help with weight loss.  At last visit, she was having nausea with Victoza I suggested to switch to Trulicity.  She is still on Victoza, and she had a lot of this at home.  Salena Saner  Cruzita Lederer, MD PhD [facility]

## 2018-08-15 NOTE — Patient Instructions (Signed)
Please continue: - Tresiba U200 54 units day. - Humalog 25-30 units 2x a day  Start Trulicity 0.75 mg weekly.  If this is not covered, we can also use Ozempic or Bydureon.  Please continue levothyroxine 175 mcg daily.  Take the thyroid hormone every day, with water, at least 30 minutes before breakfast, separated by at least 4 hours from: - acid reflux medications - calcium - iron - multivitamins  Please return in 3-4 months with your sugar log.

## 2018-11-01 ENCOUNTER — Telehealth: Payer: Self-pay | Admitting: Internal Medicine

## 2018-11-01 NOTE — Telephone Encounter (Signed)
MEDICATION: Insulin Lispro (HUMALOG KWIKPEN) 200 UNIT/ML SOPN  Insulin Degludec (TRESIBA FLEXTOUCH) 200 UNIT/ML SOPN  PHARMACY:  [facility]PPLY :   IS PATIENT OUT OF MEDICATION:   IF NOT; HOW MUCH IS LEFT:   LAST APPOINTMENT DATE: @5 /08/2018  NEXT APPOINTMENT DATE:@Visit  date not found  DO WE HAVE YOUR PERMISSION TO LEAVE A DETAILED MESSAGE:  OTHER COMMENTS:  Patient states her yearly application needs to be done.  **Let patient know to contact pharmacy at the end of the day to make sure medication is ready. **  ** Please notify patient to allow 48-72 hours to process**  **Encourage patient to contact the pharmacy for refills or they can request refills through [facility]**

## 2018-11-01 NOTE — Telephone Encounter (Signed)
Patient does not have access to computer or printer so I have printed the forms for her, she will come by tomorrow afternoon to fill out her part, sign and give [place] a copy of her proof of income.

## 2018-11-02 NOTE — Telephone Encounter (Signed)
Patient came by the clinic and filled out all the forms along with copy of income.  Forms faxed.

## 2018-11-07 ENCOUNTER — Other Ambulatory Visit: Payer: Self-pay

## 2018-11-07 DIAGNOSIS — E1165 Type 2 diabetes mellitus with hyperglycemia: Secondary | ICD-10-CM

## 2018-11-07 DIAGNOSIS — Z794 Long term (current) use of insulin: Secondary | ICD-10-CM

## 2018-11-07 MED ORDER — HUMALOG KWIKPEN 200 UNIT/ML ~~LOC~~ SOPN: 25.0000 [IU] | PEN_INJECTOR | Freq: Three times a day (TID) | SUBCUTANEOUS | 3 refills | Status: AC

## 2018-11-07 NOTE — Telephone Encounter (Signed)
Received another shipment from [employer] patient assistance with 4 boxes of Tresiba U200. Called pt and left detailed voicemail informing her that the medication was delivered and that it can be picked up at her earliest convenience.

## 2018-11-13 ENCOUNTER — Telehealth: Payer: Self-pay

## 2018-11-13 NOTE — Telephone Encounter (Signed)
Received notification from [employer] indicating pt has been approved through 11/01/2019. Letter has been labeled and sent to HIM for scanning purposes and for our future reference.

## 2018-11-28 ENCOUNTER — Telehealth: Payer: Self-pay

## 2018-11-28 NOTE — Telephone Encounter (Signed)
Patient has been re-approved for [facility], notification scanned.

## 2018-12-05 ENCOUNTER — Telehealth: Payer: Self-pay

## 2018-12-05 NOTE — Telephone Encounter (Signed)
Patient notified she can come to clinic and pick up her Humalog.

## 2018-12-05 NOTE — Telephone Encounter (Signed)
humalog from [facility] has arrived and placed in fridge for patient to pick up

## 2019-01-30 ENCOUNTER — Encounter (HOSPITAL_COMMUNITY): Payer: Self-pay

## 2019-01-31 ENCOUNTER — Telehealth: Payer: Self-pay

## 2019-01-31 NOTE — Telephone Encounter (Signed)
Forms for [employer] refill of Tyler Aas filled out, signed by Dr. Cruzita Lederer and faxed to [employer] with confirmation.

## 2019-02-14 ENCOUNTER — Telehealth: Payer: Self-pay

## 2019-02-14 NOTE — Telephone Encounter (Signed)
Patient shipment of Katie Cook has arrived and placed in fridge for pick up along with pen needles.  Notified patient.

## 2019-04-03 ENCOUNTER — Telehealth: Payer: Self-pay

## 2019-04-03 NOTE — Telephone Encounter (Signed)
Humalog shipment form Katie Cook has arrived for patient to pick up.

## 2019-04-03 NOTE — Telephone Encounter (Signed)
Notified patient via identified VM that her shipment has arrived for her to pick up along with our holiday hours.

## 2019-06-04 ENCOUNTER — Other Ambulatory Visit: Payer: Self-pay | Admitting: Internal Medicine

## 2019-08-13 ENCOUNTER — Other Ambulatory Visit: Payer: Self-pay

## 2019-08-13 ENCOUNTER — Other Ambulatory Visit: Payer: Medicare Other

## 2019-08-14 ENCOUNTER — Encounter (HOSPITAL_COMMUNITY): Payer: Self-pay | Admitting: Emergency Medicine

## 2019-08-14 ENCOUNTER — Emergency Department (HOSPITAL_COMMUNITY)
Admission: EM | Admit: 2019-08-14 | Discharge: 2019-08-14 | Disposition: A | Discharge status: Home / Self Care | Payer: Medicare Other | Attending: Emergency Medicine | Admitting: Emergency Medicine

## 2019-08-14 ENCOUNTER — Other Ambulatory Visit: Payer: Self-pay

## 2019-08-14 ENCOUNTER — Encounter: Payer: Self-pay | Admitting: Internal Medicine

## 2019-08-14 ENCOUNTER — Ambulatory Visit (INDEPENDENT_AMBULATORY_CARE_PROVIDER_SITE_OTHER): Payer: Medicare Other | Admitting: Internal Medicine

## 2019-08-14 VITALS — BP 138/90 | HR 81 | Ht 61.0 in | Wt 242.0 lb

## 2019-08-14 DIAGNOSIS — E119 Type 2 diabetes mellitus without complications: Secondary | ICD-10-CM | POA: Insufficient documentation

## 2019-08-14 DIAGNOSIS — T148XXA Other injury of unspecified body region, initial encounter: Secondary | ICD-10-CM

## 2019-08-14 DIAGNOSIS — Z794 Long term (current) use of insulin: Secondary | ICD-10-CM

## 2019-08-14 DIAGNOSIS — E039 Hypothyroidism, unspecified: Secondary | ICD-10-CM | POA: Insufficient documentation

## 2019-08-14 DIAGNOSIS — L98492 Non-pressure chronic ulcer of skin of other sites with fat layer exposed: Secondary | ICD-10-CM

## 2019-08-14 DIAGNOSIS — E1165 Type 2 diabetes mellitus with hyperglycemia: Secondary | ICD-10-CM | POA: Diagnosis not present

## 2019-08-14 DIAGNOSIS — L089 Local infection of the skin and subcutaneous tissue, unspecified: Secondary | ICD-10-CM

## 2019-08-14 DIAGNOSIS — E661 Drug-induced obesity: Secondary | ICD-10-CM | POA: Diagnosis not present

## 2019-08-14 DIAGNOSIS — Z4800 Encounter for change or removal of nonsurgical wound dressing: Secondary | ICD-10-CM | POA: Insufficient documentation

## 2019-08-14 DIAGNOSIS — I10 Essential (primary) hypertension: Secondary | ICD-10-CM | POA: Diagnosis not present

## 2019-08-14 DIAGNOSIS — E890 Postprocedural hypothyroidism: Secondary | ICD-10-CM | POA: Diagnosis not present

## 2019-08-14 DIAGNOSIS — Z79899 Other long term (current) drug therapy: Secondary | ICD-10-CM | POA: Insufficient documentation

## 2019-08-14 LAB — LIPID PANEL
Cholesterol: 161 mg/dL (ref 0–200)
HDL: 48.5 mg/dL (ref >39.00)
LDL Cholesterol: 97 mg/dL (ref 0–99)
NonHDL: 112.64
Total CHOL/HDL Ratio: 3
Triglycerides: 80 mg/dL (ref 0.0–149.0)
VLDL: 16 mg/dL (ref 0.0–40.0)

## 2019-08-14 LAB — COMPREHENSIVE METABOLIC PANEL WITH GFR
ALT: 20 U/L (ref 0–35)
AST: 18 U/L (ref 0–37)
Albumin: 4 g/dL (ref 3.5–5.2)
Alkaline Phosphatase: 91 U/L (ref 39–117)
BUN: 14 mg/dL (ref 6–23)
CO2: 30 meq/L (ref 19–32)
Calcium: 9.6 mg/dL (ref 8.4–10.5)
Chloride: 101 meq/L (ref 96–112)
Creatinine, Ser: 0.59 mg/dL (ref 0.40–1.20)
GFR: 102.64 mL/min
Glucose, Bld: 150 mg/dL — ABNORMAL HIGH (ref 70–99)
Potassium: 3.9 meq/L (ref 3.5–5.1)
Sodium: 137 meq/L (ref 135–145)
Total Bilirubin: 0.4 mg/dL (ref 0.2–1.2)
Total Protein: 7.2 g/dL (ref 6.0–8.3)

## 2019-08-14 LAB — MICROALBUMIN / CREATININE URINE RATIO
Creatinine,U: 26.6 mg/dL
Microalb Creat Ratio: 5.4 mg/g (ref 0.0–30.0)
Microalb, Ur: 1.4 mg/dL (ref 0.0–1.9)

## 2019-08-14 LAB — POCT GLYCOSYLATED HEMOGLOBIN (HGB A1C): Hemoglobin A1C: 9.1 % — AB (ref 4.0–5.6)

## 2019-08-14 LAB — T4, FREE: Free T4: 1.43 ng/dL (ref 0.60–1.60)

## 2019-08-14 LAB — TSH: TSH: 0.85 u[IU]/mL (ref 0.35–4.50)

## 2019-08-14 MED ORDER — NYSTATIN 100000 UNIT/GM EX POWD
1.0000 | Freq: Three times a day (TID) | CUTANEOUS | 0 refills | Status: DC
Start: 2019-08-14 — End: 2019-08-20

## 2019-08-14 MED ORDER — INSULIN PEN NEEDLE 32G X 4 MM MISC: 11 refills | Status: DC

## 2019-08-14 MED ORDER — ONETOUCH VERIO VI STRP: ORAL_STRIP | 1 refills | Status: DC

## 2019-08-14 MED ORDER — DOXYCYCLINE HYCLATE 100 MG PO TABS: 100.0000 mg | ORAL_TABLET | Freq: Two times a day (BID) | ORAL | 0 refills | Status: DC

## 2019-08-14 MED ORDER — TRULICITY 1.5 MG/0.5ML ~~LOC~~ SOAJ: 1.5000 mg | SUBCUTANEOUS | 11 refills | Status: DC

## 2019-08-14 MED ORDER — PEN NEEDLES 32G X 6 MM MISC: 3 refills | Status: DC

## 2019-08-14 NOTE — Progress Notes (Addendum)
Patient ID: Katie Cook, female   DOB: March 28, 1956, 64 y.o.   MRN: [id number]  This visit occurred during the SARS-CoV-2 public health emergency.  Safety protocols were in place, including screening questions prior to the visit, additional usage of staff PPE, and extensive cleaning of exam room while observing appropriate contact time as indicated for disinfecting solutions.   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 Last visit 1 year ago (virtual).  She did better with her diabetes management after she started to obtain patient assistance from [employer].  Hypothyroidism: Reviewed history: - she had MNG >> developed compression sxs - Thyroidectomy 2011-2012 - hypothyroidism >> Levothyroxine, then Armour, then levothyroxine, then Nature-Throid, then NP thyroid, but not levothyroxine due to price  Pt is on levothyroxine 175 mcg daily, taken: - in am - fasting - at least 30 min from b'fast - no Ca, Fe, + MVI and PPIs later in the day - not on Biotin  Reviewed her TFTs: Lab Results  Component Value Date   TSH 0.48 04/20/2018   TSH 0.04 (L) 12/26/2017   TSH 5.17 (H) 06/01/2017   TSH 5.89 (H) 12/30/2016   TSH 1.24 07/28/2016   FREET4 1.51 04/20/2018   FREET4 1.38 12/26/2017   FREET4 0.64 06/01/2017   FREET4 0.71 12/30/2016   FREET4 0.73 07/28/2016  04/2013: TSH 0.4 (at the [facility])  Pt denies: - feeling nodules in neck - hoarseness - dysphagia - choking - SOB with lying down  DM2: - h/o GDM during pregnancy and type 2 diabetes in 2005  Latest HbA1c: Lab Results  Component Value Date   HGBA1C 8.6 (A) 04/20/2018   HGBA1C 8.3 (A) 12/26/2017   HGBA1C 9.4% 06/03/2017  ~8% 04/2013.  Pt is on a regimen of: - Tresiba U200 50 >> 54 >> 60 >> 50 units day. -  Humalog 25-30 >> 35 units 2x a day   she was not sent this Trulicity was not covered.  Could not afford: Januvia 100 mg daily, Tradjenta 5 mg in am She was Invokana 100 mg - added 01/2014 >> initially stopped for diarrhea >> restarted 06/2014 >> but N/D >> restarted 10/2014 >> yeast inf >> stopped She stopped  Metformin XR 500 mg at lunchtime and 1000 mg at dinner - started 09/2013 >> nausea, diarrhea. She stopped Victoza >> nausea, 3 years. She tried Actos. She tried regular Metformin >> nausea. She tried Prandin.  Pt checks her sugars 2 times a day: - am: 133-221, 231 >> 91-205 >> 70, 190-220 >> 99-221, 306 - before lunch: 78, 160 >> 76-209 >> 200s >> 52, 158-260, 320 - 2h after lunch: 140-150s >> 133, 198-220 >> n/c >> 352, 473 - before dinner:  126, 154 >> 174-322 >> 200s >> 139, 195, 334 - 2h after dinner: 170-250s >> 279, 419 >> n/c >> 392 - bedtime: n/c >> 89, 101-200, 217 >> n/c - nighttime: 103 >> n/c >> 288 >> 69, 190-404, 531 >> up to 250 >> n/c Lowest sugar was 76 >>70 >> 52; she has hypoglycemia awareness in the 70s. Highest sugar was 531 >> 250 >> 473.  Meter: AccuChek Aviva.  Pt's meals: - Breakfast: yoghurt + fruit - Lunch: salad with [place] - Dinner: chicken, broccoli, meat - Snacks: 2-3: fruit, popcorn  -No CKD, last BUN/creatinine:  04/19/2018: Glucose 125,  BUN/creatinine 15/0.55, EGFR 112, rest of the CMP normal Lab Results  Component Value Date   BUN 12 12/30/2016   CREATININE 0.58 12/30/2016  03/19/2014: 17/0.75 On losartan. -+ HL: 04/19/2018: Lipids: 169/85/52/99 Lab Results  Component Value Date   CHOL 175 12/30/2016   HDL 66.50 12/30/2016   LDLCALC 92 12/30/2016   TRIG 83.0 12/30/2016   CHOLHDL 3 12/30/2016  04/08/2015: 203/103/58/124 03/19/2014: 183/138/52/103 She is not on a statin. - last eye exam was in 2019: No DR, + cataract; Dr. Delman Cheadle. - no numbness and tingling in her feet.  At this visit, she describes an ulcer on the left anterior  abdomen that became more irritated and painful in the last 2 days.  She has been in the emergency room for this a month ago.  She was treated with antibiotics, with improvement of the infection, but this has now gotten worse again.  No fever/chills.  ROS: Constitutional: no weight gain/no weight loss, no fatigue, no subjective hyperthermia, no subjective hypothermia Eyes: no blurry vision, no xerophthalmia ENT: no sore throat, + see HPI Cardiovascular: no CP/no SOB/no palpitations/no leg swelling Respiratory: no cough/no SOB/no wheezing Gastrointestinal: no N/no V/no D/no C/no acid reflux Musculoskeletal: no muscle aches/no joint aches Skin: no rashes, no hair loss, + pain at the site of her abdominal ulcer Neurological: no tremors/no numbness/no tingling/no dizziness  I reviewed pt's medications, allergies, PMH, social hx, family hx, and changes were documented in the history of present illness. Otherwise, unchanged from my initial visit note.  Past Medical History:  Diagnosis Date  . Diabetes mellitus without complication ([place])   . Hypertension   . Thyroid disease   . Uncontrolled diabetes mellitus type 2 without complications ([place]) 8/58/8502   Past Surgical History:  Procedure Laterality Date  . CESAREAN SECTION    . THYROID SURGERY     Social History   Socioeconomic History  . Marital status: Legally Separated    Spouse name: Not on file  . Number of children: 3  . Years of education: Some college  . Highest education level: Some college, no degree  Occupational History  . Not on file  Tobacco Use  . Smoking status: Never Smoker  . Smokeless tobacco: Never Used  Substance and Sexual Activity  . Alcohol use: No    Alcohol/week: 0.0 standard drinks  . Drug use: No  . Sexual activity: Not Currently  Other Topics Concern  . Not on file  Social History Narrative   Lives alone   Caffeine use: Decaf tea. Soda rarely        Social Determinants of Systems developer Strain:   . Difficulty of Paying Living Expenses:   Food Insecurity:   . Worried About Charity fundraiser in the Last Year:   . Arboriculturist in the Last Year:   Transportation Needs:   . Film/video editor (Medical):   Marland Kitchen Lack of Transportation (Non-Medical):   Physical Activity:   . Days of Exercise per Week:   . Minutes of Exercise per Session:   Stress:   . Feeling of Stress :   Social Connections:   . Frequency of Communication with Friends and Family:   . Frequency of Social Gatherings with Friends and Family:   . Attends Religious Services:   . Active Member of Clubs or Organizations:   . Attends Archivist Meetings:   Marland Kitchen Marital Status:   Intimate Partner Violence:   . Fear  of Current or Ex-Partner:   . Emotionally Abused:   Marland Kitchen Physically Abused:   . Sexually Abused:    Current Outpatient Medications on File Prior to Visit  Medication Sig Dispense Refill  . beclomethasone (QVAR) 40 MCG/ACT inhaler Inhale 2 puffs into the lungs 2 (two) times daily.    . carvedilol (COREG) 12.5 MG tablet Take 12.5 mg by mouth daily.    . Dulaglutide (TRULICITY) 8.29 HB/7.1IR SOPN Inject 0.75 mg in am weekly under skin (Patient not taking: Reported on 06/15/2018) 4 pen 5  . glucose blood (ONE TOUCH ULTRA TEST) test strip Use to check blood sugar 3 times per day. 300 each 1  . Insulin Degludec (TRESIBA FLEXTOUCH) 200 UNIT/ML SOPN Inject 50 Units into the skin daily. (Patient taking differently: Inject 60 Units into the skin daily. ) 4 pen 5  . Insulin Lispro (HUMALOG KWIKPEN) 200 UNIT/ML SOPN Inject 25-30 Units into the skin 3 (three) times daily before meals. 30 pen 3  . insulin NPH Human (HUMULIN N) 100 UNIT/ML injection Use 35 units in the morning and 30 units in the afternoon 30 mL 3  . Insulin Pen Needle 32G X 5 MM MISC Use to inject insulin 4 times daily 400 each 5  . insulin regular (HUMULIN R) 100 units/mL injection Use 25-30 units with Breakfast and Dinner 10-20  units with lunch 30 mL 3  . Lancets (ONETOUCH ULTRASOFT) lancets Use to test blood sugar 3 times daily as instructed. Dx: E11.65 300 each 3  . levocetirizine (XYZAL) 5 MG tablet Take 5 mg by mouth every evening.    Marland Kitchen levothyroxine (EUTHYROX) 175 MCG tablet Take 1 tablet (175 mcg total) by mouth daily before breakfast. MUST HAVE APPOINTMENT FOR FURTHER REFILLS 150 tablet 0  . levothyroxine (SYNTHROID, LEVOTHROID) 200 MCG tablet TAKE 1 TABLET BY MOUTH ONCE DAILY BEFORE BREAKFAST (Patient not taking: Reported on 06/15/2018) 60 tablet 0  . liraglutide (VICTOZA) 18 MG/3ML SOPN Inject under skin 1.8 mg daily. 9 pen 3  . losartan-hydrochlorothiazide (HYZAAR) 100-25 MG per tablet Take 1 tablet by mouth daily.    Marland Kitchen omeprazole (PRILOSEC) 20 MG capsule Take 20 mg by mouth 2 (two) times daily.    Glory Rosebush VERIO test strip USE TO TEST BLOOD SUGAR 3  TO 4 TIMES DAILY AS  INSTRUCTED 400 each 1   No current facility-administered medications on file prior to visit.   Allergies  Allergen Reactions  . Metformin And Related Diarrhea    and dizziness  . Ceclor [Cefaclor] Rash  . Sulfa Antibiotics Rash   Family History  Problem Relation Age of Onset  . Ovarian cancer Mother   . Cancer Mother   . Cancer Father        pancreatic   . Stroke Father   . Diabetes Sister   . Cancer Maternal Aunt        breast cancer   . Diabetes Maternal Grandmother   . Cancer Maternal Aunt        thyroid   . Hypertension Sister   . Stroke Paternal Uncle     PE: BP 138/90   Pulse 81   Ht 5' 1"  (1.549 m)   Wt 242 lb (109.8 kg)   SpO2 98%   BMI 45.73 kg/m  Body mass index is 45.73 kg/m. Temp 98.30F Wt Readings from Last 3 Encounters:  08/14/19 242 lb (109.8 kg)  06/15/18 241 lb (109.3 kg)  04/20/18 240 lb 6.4 oz (109 kg)   Constitutional:  overweight, in NAD Eyes: PERRLA, EOMI, no exophthalmos ENT: moist mucous membranes, no thyromegaly, no cervical lymphadenopathy Cardiovascular: RRR, No MRG Respiratory: CTA  B Gastrointestinal: abdomen soft, NT, ND, BS+ Musculoskeletal: no deformities, strength intact in all 4 Skin: moist, warm, + 2.5 diameter abdominal ulcer, purulent, with erythema for approximately 7 cm around it:  Neurological: no tremor with outstretched hands, DTR normal in all 4  ASSESSMENT: 1. DM2, insulin-dependent, uncontrolled, without long term complications, but with hyperglycemia - I suggested Invokana in the past, to also help with her high BP and also her obesity, but she did not start afraid of side effects.  - she thinks Engineer, agricultural not helping much >> changed to [place]. This would be preferred also b/c of her tendency for low CBGs, which Tyler Aas may reduce.  2. Hypothyroidism - postsurgical - thyroidectomy 2011 - h/o benign MNG  3. Obesity class III  4.  Abdominal ulcer  PLAN:  1. Patient with longstanding, uncontrolled, type 2 diabetes, on basal-bolus insulin regimen and weekly GLP-1 receptor agonist.  She continues to get patient assistance from [employer] and sugars started to improve after she started this.  At last visit, sugars were high, however, mostly in the 200s after she ran out of Victoza and she could not get Trulicity in time.  We gave her samples of Trulicity until her supply arrived.  We did not change her insulin doses. -At this visit, sugars are quite fluctuating, usually lower in the morning and increasing during the day. -She did not start Trulicity since last visit but she feels that with the change of her insurance (now having [place] healthcare), she may be able to obtain it from the pharmacy.  Prescription sent. - I advised her to:  Patient Instructions  Please continue: - Tresiba U200 50 units day - Humalog 35 units 2x a day  Please start Trulicity 1.5 mg weekly.  Please continue levothyroxine 175 mcg daily.  Take the thyroid hormone every day, with water, at least 30 minutes before breakfast, separated by at least 4 hours from: - acid reflux  medications - calcium - iron - multivitamins  Please go to the ER and try to be seen ASAP.  Please return in 3-4 months with your sugar log.   - we checked her HbA1c: 9.1% (higher) - advised to check sugars at different times of the day - 3x a day, rotating check times - advised for yearly eye exams >> she is not UTD - check annual labs today - return to clinic in 4 months    2. Hypothyroidism - uncontrolled, likely due to incomplete compliance - latest thyroid labs reviewed with pt >> normal: Lab Results  Component Value Date   TSH 0.48 04/20/2018   - she continues on LT4 175 mcg daily - pt feels good on this dose. - we discussed about taking the thyroid hormone every day, with water, >30 minutes before breakfast, separated by >4 hours from acid reflux medications, calcium, iron, multivitamins. Pt. is taking it correctly. - will check thyroid tests today: TSH and fT4 - If labs are abnormal, she will need to return for repeat TFTs in 1.5 months  3. Obesity class III -Continue GLP-1 receptor agonist which should also help with weight loss -No significant weight loss since last visit  4.  Abdominal skin ulcer - This is quite large and appears infected -Tried to schedule her with wound care ASAP, but appointment could only be scheduled 10 days from now -For now  we will start doxycycline twice a day for 7 days -We will direct her to ER -Per her request we will also prescribe nystatin to use in her abdominal fold since she develops irritation/itching/pain -possibly candidiasis.  Component     Latest Ref Rng & Units 08/14/2019  Sodium     135 - 145 mEq/L 137  Potassium     3.5 - 5.1 mEq/L 3.9  Chloride     96 - 112 mEq/L 101  CO2     19 - 32 mEq/L 30  Glucose     70 - 99 mg/dL 150 (H)  BUN     6 - 23 mg/dL 14  Creatinine     0.40 - 1.20 mg/dL 0.59  Total Bilirubin     0.2 - 1.2 mg/dL 0.4  Alkaline Phosphatase     39 - 117 U/L 91  AST     0 - 37 U/L 18  ALT     0 -  35 U/L 20  Total Protein     6.0 - 8.3 g/dL 7.2  Albumin     3.5 - 5.2 g/dL 4.0  GFR     >60.00 mL/min 102.64  Calcium     8.4 - 10.5 mg/dL 9.6  Cholesterol     0 - 200 mg/dL 161  Triglycerides     0.0 - 149.0 mg/dL 80.0  HDL Cholesterol     >39.00 mg/dL 48.50  VLDL     0.0 - 40.0 mg/dL 16.0  LDL (calc)     0 - 99 mg/dL 97  Total CHOL/HDL Ratio      3  NonHDL      112.64  Microalb, Ur     0.0 - 1.9 mg/dL 1.4  Creatinine,U     mg/dL 26.6  MICROALB/CREAT RATIO     0.0 - 30.0 mg/g 5.4  TSH     0.35 - 4.50 uIU/mL 0.85  T4,Free(Direct)      0.60 - 1.60 ng/dL 1.43   Labs at goal, with the exception of high glucose.  Philemon Kingdom, MD PhD [facility]

## 2019-08-14 NOTE — ED Triage Notes (Addendum)
Pt reports a wound on her LL abdomen, states it was treated with abx which she finished about 2 weeks ago, now is noticing more redness and like the wound is getting infected again. Saw endocrinology who sent her here for further eval of wound today. Had labs done at [facility] endocrinology earlier this morning.

## 2019-08-14 NOTE — Addendum Note (Signed)
Addended by: Darliss Ridgel I on: 08/14/2019 10:09 AM   Modules accepted: Orders

## 2019-08-14 NOTE — ED Provider Notes (Signed)
[place] EMERGENCY DEPARTMENT Provider Note   CSN: [id number] Arrival date & time: 08/14/19  1043     History Chief Complaint  Patient presents with  . Wound Check    Katie Cook is a 64 y.o. female presenting for evaluation of abdominal wound.  Patient states she has a chronic wound on her lower stomach.  She was on antibiotics last month for this, had slight improvement for 2 weeks before symptoms worsened again 2 days ago.  She was seen by endocrinology this morning, instructed to restart doxycycline, prescription was given.  Patient was sent to the ER, as she cannot follow-up with the wound clinic for about a month.  Patient denies fevers, chills, nausea, vomiting, confusion, weakness.  HPI     Past Medical History:  Diagnosis Date  . Diabetes mellitus without complication ([place])   . Hypertension   . Thyroid disease   . Uncontrolled diabetes mellitus type 2 without complications 07/19/8117    Patient Active Problem List   Diagnosis Date Noted  . Class 3 drug-induced obesity in adult 07/28/2016  . Cognitive changes 01/05/2016  . Hypothyroidism 07/26/2013  . Uncontrolled type 2 diabetes mellitus with hyperglycemia, with long-term current use of insulin ([place]) 07/26/2013    Past Surgical History:  Procedure Laterality Date  . CESAREAN SECTION    . THYROID SURGERY       OB History    Gravida  4   Para      Term      Preterm      AB  1   Living  3     SAB  1   TAB      Ectopic      Multiple      Live Births  3           Family History  Problem Relation Age of Onset  . Ovarian cancer Mother   . Cancer Mother   . Cancer Father        pancreatic   . Stroke Father   . Diabetes Sister   . Cancer Maternal Aunt        breast cancer   . Diabetes Maternal Grandmother   . Cancer Maternal Aunt        thyroid   . Hypertension Sister   . Stroke Paternal Uncle     Social History   Tobacco Use  . Smoking status:  Never Smoker  . Smokeless tobacco: Never Used  Substance Use Topics  . Alcohol use: No    Alcohol/week: 0.0 standard drinks  . Drug use: No    Home Medications Prior to Admission medications   Medication Sig Start Date End Date Taking? Authorizing Provider  beclomethasone (QVAR) 40 MCG/ACT inhaler Inhale 2 puffs into the lungs 2 (two) times daily.    [provider]  carvedilol (COREG) 12.5 MG tablet Take 12.5 mg by mouth daily.    [provider]  doxycycline (VIBRA-TABS) 100 MG tablet Take 1 tablet (100 mg total) by mouth 2 (two) times daily. 08/14/19   Philemon Kingdom, MD  Dulaglutide (TRULICITY) 1.5 JY/7.8GN SOPN Inject 1.5 mg into the skin once a week. 08/14/19   Philemon Kingdom, MD  glucose blood (ONE TOUCH ULTRA TEST) test strip Use to check blood sugar 3 times per day. 08/19/14   Philemon Kingdom, MD  glucose blood (ONETOUCH VERIO) test strip USE TO TEST BLOOD SUGAR 3  TO 4 TIMES DAILY AS  INSTRUCTED 08/14/19  Carlus Pavlov, MD  Insulin Degludec (TRESIBA FLEXTOUCH) 200 UNIT/ML SOPN Inject 50 Units into the skin daily. Patient taking differently: Inject 60 Units into the skin daily.  06/01/17   Carlus Pavlov, MD  Insulin Lispro (HUMALOG KWIKPEN) 200 UNIT/ML SOPN Inject 25-30 Units into the skin 3 (three) times daily before meals. 11/07/18   Reather Littler, MD  Insulin Pen Needle (PEN NEEDLES) 32G X 6 MM MISC Use 4 x daily with insulin 08/14/19   Carlus Pavlov, MD  Lancets [facility] ULTRASOFT) lancets Use to test blood sugar 3 times daily as instructed. Dx: E11.65 08/19/14   Carlus Pavlov, MD  levocetirizine (XYZAL) 5 MG tablet Take 5 mg by mouth every evening.    [provider]  levothyroxine (EUTHYROX) 175 MCG tablet Take 1 tablet (175 mcg total) by mouth daily before breakfast. MUST HAVE APPOINTMENT FOR FURTHER REFILLS 06/04/19   Carlus Pavlov, MD  losartan-hydrochlorothiazide (HYZAAR) 100-25 MG per tablet Take 1 tablet by mouth daily.    [provider]  nystatin (MYCOSTATIN/NYSTOP) powder Apply 1 application topically 3 (three) times daily. 08/14/19   Carlus Pavlov, MD  omeprazole (PRILOSEC) 20 MG capsule Take 20 mg by mouth 2 (two) times daily.    [provider]    Allergies    Metformin and related, Ceclor [cefaclor], and Sulfa antibiotics  Review of Systems   Review of Systems  Skin: Positive for wound.  All other systems reviewed and are negative.   Physical Exam Updated Vital Signs BP (!) 181/91 (BP Location: Left Arm)   Pulse 83   Temp 98.1 F (36.7 C) (Oral)   Resp 16   SpO2 99%   Physical Exam Vitals and nursing note reviewed.  Constitutional:      General: She is not in acute distress.    Appearance: She is well-developed.  HENT:     Head: Normocephalic and atraumatic.  Eyes:     Conjunctiva/sclera: Conjunctivae normal.     Pupils: Pupils are equal, round, and reactive to light.  Cardiovascular:     Rate and Rhythm: Normal rate and regular rhythm.  Pulmonary:     Effort: Pulmonary effort is normal. No respiratory distress.     Breath sounds: Normal breath sounds. No wheezing.  Abdominal:     General: There is no distension.     Palpations: Abdomen is soft. There is no mass.     Tenderness: There is no abdominal tenderness. There is no guarding or rebound.     Comments: See picture below.  Approximately 3 cm in diameter wound with purulent drainage.  Surrounding tenderness and erythema.  No fluctuance.  Musculoskeletal:        General: Normal range of motion.     Cervical back: Normal range of motion and neck supple.  Skin:    General: Skin is warm and dry.     Capillary Refill: Capillary refill takes less than 2 seconds.  Neurological:     Mental Status: She is alert and oriented to person, place, and time.        ED Results / Procedures / Treatments   Labs (all labs ordered are listed, but only abnormal results are displayed) Labs Reviewed  AEROBIC CULTURE  (SUPERFICIAL SPECIMEN)    EKG None  Radiology No results found.  Procedures Procedures (including critical care time)  Medications Ordered in ED Medications - No data to display  ED Course  I have reviewed the triage vital signs and the nursing notes.  Pertinent  labs & imaging results that were available during my care of the patient were reviewed by me and considered in my medical decision making (see chart for details).    MDM Rules/Calculators/A&P                      Patient presenting for evaluation of abdominal wound.  On exam, area appears infected with purulent drainage.  Patient reports worsening symptoms, consistent with secondary infection.  Will culture for further evaluation.  Patient's endocrinologist is recommended doxycycline, I am agreeable with this, as it helped last time.  Patient sent here, health care is not available for another month.  Will give information for follow-up with hospice in [place], as they also can help manage wound care.  Will encourage patient to continue to try and follow-up with the wound care center.  Patient without signs of sepsis at this time, I do not believe she needs emergent blood work in the ED, since blood work was drawn by her endocrinologist earlier today (although not yet resulted).  Discussed wound care management.  Discussed continued monitoring signs for worsening infection, specifically systemic infections.  At this time, patient appears safe for discharge.  Return precautions given.  Patient states she understands and agrees to plan.  Final Clinical Impression(s) / ED Diagnoses Final diagnoses:  Wound infection    Rx / DC Orders ED Discharge Orders    None       Alveria Apley, PA-C 08/14/19 1418    Benjiman Core, MD 08/14/19 [phone]

## 2019-08-14 NOTE — Patient Instructions (Addendum)
Please continue: - Tresiba U200 50 units day - Humalog 35 units 2x a day  Please start Trulicity 1.5 mg weekly.  Please continue levothyroxine 175 mcg daily.  Take the thyroid hormone every day, with water, at least 30 minutes before breakfast, separated by at least 4 hours from: - acid reflux medications - calcium - iron - multivitamins  Please go to the ER and try to be seen ASAP.  Please return in 3-4 months with your sugar log.

## 2019-08-14 NOTE — Discharge Instructions (Signed)
Take antibiotics as prescribed.  Take entire course, even if symptoms improve. Use Tylenol or ibuprofen as needed for pain. Wash with soap and water daily.  Otherwise keep covered and clean. Follow-up with the doctor listed below for further evaluation management of the wound.  I would continue to try to establish with the wound care center as well.  Return to the emergency room if you develop high fevers, severe worsening pain, or any new, worsening, or concerning symptoms.

## 2019-08-15 ENCOUNTER — Other Ambulatory Visit: Payer: Self-pay | Admitting: Internal Medicine

## 2019-08-15 DIAGNOSIS — L98492 Non-pressure chronic ulcer of skin of other sites with fat layer exposed: Secondary | ICD-10-CM

## 2019-08-15 MED ORDER — ONETOUCH VERIO VI STRP: ORAL_STRIP | 1 refills | Status: DC

## 2019-08-15 NOTE — Addendum Note (Signed)
Addended by: Darliss Ridgel I on: 08/15/2019 07:37 AM   Modules accepted: Orders

## 2019-08-16 ENCOUNTER — Institutional Professional Consult (permissible substitution): Payer: Medicare Other | Admitting: Internal Medicine

## 2019-08-17 LAB — AEROBIC CULTURE W GRAM STAIN (SUPERFICIAL SPECIMEN)

## 2019-08-18 ENCOUNTER — Telehealth: Payer: Self-pay | Admitting: Emergency Medicine

## 2019-08-18 NOTE — Telephone Encounter (Signed)
Post ED Visit - Positive Culture Follow-up  Culture report reviewed by antimicrobial stewardship pharmacist: [facility] []  Nathan Batchelder, Pharm.D. []  , Pharm.D., BCPS AQ-ID []  , Pharm.D., BCPS []  Celedonio Miyamoto, Pharm.D., BCPS []  [place], Garvin Fila.D., BCPS, AAHIVP []  , Pharm.D., BCPS, AAHIVP []  Georgina Pillion, PharmD, BCPS []  , PharmD, BCPS []  [place], PharmD, BCPS [x]  [address], PharmD []  , PharmD, BCPS []  Estella Husk, PharmD  Pharmacy Team []  Lysle Pearl, PharmD []  , PharmD []  Phillips Climes, PharmD []  , Rph []  Agapito Games) , PharmD []  Joaquim Lai, PharmD []  , PharmD []  Mervyn Gay, PharmD []  , PharmD []  Vinnie Level, PharmD []  [facility], PharmD []  , PharmD []  Len Childs, PharmD   Positive Aerobic culture Treated with Doxycycline, organism sensitive to the same and no further patient follow-up is required at this time.  Carmesha Morocco 08/18/2019, 6:21 PM

## 2019-08-20 ENCOUNTER — Encounter: Payer: Self-pay | Admitting: Internal Medicine

## 2019-08-20 ENCOUNTER — Telehealth: Payer: Self-pay | Admitting: Internal Medicine

## 2019-08-20 ENCOUNTER — Other Ambulatory Visit: Payer: Self-pay

## 2019-08-20 ENCOUNTER — Institutional Professional Consult (permissible substitution): Payer: Medicare Other | Admitting: Internal Medicine

## 2019-08-20 ENCOUNTER — Ambulatory Visit: Payer: Medicare Other | Admitting: Internal Medicine

## 2019-08-20 VITALS — BP 178/91 | HR 79 | Temp 97.7°F | Ht 61.0 in | Wt 244.0 lb

## 2019-08-20 DIAGNOSIS — S31109A Unspecified open wound of abdominal wall, unspecified quadrant without penetration into peritoneal cavity, initial encounter: Secondary | ICD-10-CM | POA: Diagnosis not present

## 2019-08-20 MED ORDER — NYSTATIN 100000 UNIT/GM EX CREA: 1.0000 "application " | TOPICAL_CREAM | Freq: Two times a day (BID) | CUTANEOUS | 0 refills | Status: DC

## 2019-08-20 MED ORDER — LEVOTHYROXINE SODIUM 175 MCG PO TABS: 175.0000 ug | ORAL_TABLET | Freq: Every day | ORAL | 3 refills | Status: DC

## 2019-08-20 NOTE — Telephone Encounter (Signed)
RX has been sent.

## 2019-08-20 NOTE — Progress Notes (Signed)
Subjective:     Patient ID: Katie Cook, female    DOB: 1956-01-12, 64 y.o.   MRN: [id number]  Chief Complaint  Patient presents with  . Advice Only    for chronic skin ulcer    HPI: The patient is a 64 y.o. female here for acute abdominal wound   Patient states that about 1-2 month ago she noticed skin breakdown to her abdominal area.  She reports increased moisture to her pannus with areas of pruritus.  She states that the skin breakdown became larger and has required 2 antibiotics due to infection.  She states the 2 antibiotics were Augmentin and doxycycline.  She currently denies increased warmth to the area, induration, increased redness or fever/chills.  Review of Systems  All other systems reviewed and are negative.    has a past medical history of Diabetes mellitus without complication (HCC), Hypertension, Thyroid disease, and Uncontrolled diabetes mellitus type 2 without complications (07/26/2013).  has a past surgical history that includes Cesarean section and Thyroid surgery.  reports that she has never smoked. She has never used smokeless tobacco. Objective:   Vital Signs BP (!) 178/91 (BP Location: Left Arm, Patient Position: Sitting, Cuff Size: Large)   Pulse 79   Temp 97.7 F (36.5 C) (Temporal)   Ht 5\' 1"  (1.549 m)   Wt 244 lb (110.7 kg)   SpO2 97%   BMI 46.10 kg/m  Vital Signs and Nursing Note Reviewed Physical Exam  Constitutional: She is well-developed, well-nourished, and in no distress.  Skin:  Lower left pannus wound measures:  2.5 x 2.5 x 0.1 cm No induration noted No tunneling  Patchy areas of erythema        Assessment/Plan:     ICD-10-CM   1. Wound of abdomen  S31.109A    Assessment:  Wound of abdomen  The wound does not have any signs of infection today.  There was some sloughing noted and this was debrided in office with curette, gauze and wound cleanser.  The location of the wound is under the pannus.  She reports some  itching and there is some patchy areas of irritation.  She has nystatin powder but states she has trouble getting it into the pannus area.  I will switch this to a nystatin cream.  At this time I recommended collagen daily dressing changes to the wound itself.  We also discussed her current uncontrolled diabetic status.  She currently follows with endocrinology.  I brought up the idea of an Omni pod as a potential future treatment option if cost allows.  She reports using her abdomen as an injection site but does not recall that specific area being used.  I will follow this closely and have her follow up in one week.  Plan -Dressing change in the office with Vaseline, Adaptic, nonstick pad and tape -Collagen dressing daily, will ask for supplies to be sent to patient through [facility] -Follow-up in 1 week  Assessment: Essential hypertension  Patient reports a history of chronic high blood pressure.  She is currently on 3 blood pressure lowering medications including Coreg, losartan and hydrochlorothiazide.  She states she does not have a primary care physician.  I recommended the internal medicine residency program.  She was agreeable.  We will do a referral.  She may benefit from the addition of spironolactone.  Plan -Referral to internal medicine residency program -Continue current regimen.  May benefit from the addition of spironolactone   I spent 45 minutes  face to face with the patient. Greater than 50% of the time was spent on counseling and coordination of care, which is detailed in the A&P.   Boyd Kerbs, DO 08/20/2019, 3:31 PM

## 2019-08-20 NOTE — Patient Instructions (Addendum)
Katie Cook,  It was a pleasure seeing you today.  For the wound please start using collagen daily with dressing changes.  Please follow the instructions below 1) cleaned with normal saline 2) Place collagen 3) cover with a nonstick pad 4) keep in place with silicone tape  5) please call if you have any issues with the dressings  A referral will be placed to the internal medicine residency Center for a PCP  Nystatin cream has been sent to your pharmacy

## 2019-08-20 NOTE — Telephone Encounter (Signed)
Medication Refill Request  Did you call your pharmacy and request this refill first? No-    Patient states that she had lab work done to check her thyroid levels and requests that the following RX dosage be based on recent lab results and sent to [facility] asap.  . If patient has not contacted pharmacy first, instruct them to do so for future refills.  . Remind them that contacting the pharmacy for their refill is the quickest method to get the refill.  . Refill policy also stated that it will take anywhere between 24-72 hours to receive the refill.    Name of medication? Levothyroxine  Is this a 90 day supply? Yes  Name and location of pharmacy?  [facility] - [place], [place] - [address] Phone:  [phone]  Fax:  [phone]       . Is the request for diabetes test strips? No . If yes, what brand? N/A

## 2019-08-21 ENCOUNTER — Telehealth: Payer: Self-pay

## 2019-08-21 NOTE — Telephone Encounter (Signed)
Faxed medical supply order to [facility] for silver collagen, silicone tape, and telfa pads. Patient is requesting products to sent to [address] [place], [place] [address]

## 2019-09-17 ENCOUNTER — Ambulatory Visit (INDEPENDENT_AMBULATORY_CARE_PROVIDER_SITE_OTHER): Payer: Medicare Other | Admitting: Internal Medicine

## 2019-09-17 VITALS — BP 177/83 | HR 67 | Temp 98.6°F | Ht 60.0 in | Wt 244.4 lb

## 2019-09-17 DIAGNOSIS — I10 Essential (primary) hypertension: Secondary | ICD-10-CM

## 2019-09-17 DIAGNOSIS — J45909 Unspecified asthma, uncomplicated: Secondary | ICD-10-CM | POA: Diagnosis not present

## 2019-09-17 DIAGNOSIS — Z794 Long term (current) use of insulin: Secondary | ICD-10-CM

## 2019-09-17 DIAGNOSIS — E1165 Type 2 diabetes mellitus with hyperglycemia: Secondary | ICD-10-CM | POA: Diagnosis not present

## 2019-09-17 MED ORDER — AMLODIPINE BESYLATE 5 MG PO TABS: 5.0000 mg | ORAL_TABLET | Freq: Every day | ORAL | 0 refills | Status: DC

## 2019-09-17 MED ORDER — ALBUTEROL SULFATE HFA 108 (90 BASE) MCG/ACT IN AERS: 2.0000 | INHALATION_SPRAY | Freq: Four times a day (QID) | RESPIRATORY_TRACT | 1 refills | Status: DC | PRN

## 2019-09-17 MED ORDER — LOSARTAN POTASSIUM 100 MG PO TABS: 100.0000 mg | ORAL_TABLET | Freq: Every day | ORAL | 1 refills | Status: DC

## 2019-09-17 MED ORDER — BECLOMETHASONE DIPROPIONATE 40 MCG/ACT IN AERS: 2.0000 | INHALATION_SPRAY | Freq: Two times a day (BID) | RESPIRATORY_TRACT | 0 refills | Status: DC

## 2019-09-17 MED ORDER — CARVEDILOL 25 MG PO TABS: 25.0000 mg | ORAL_TABLET | Freq: Every day | ORAL | 1 refills | Status: DC

## 2019-09-17 MED ORDER — HYDROCHLOROTHIAZIDE 25 MG PO TABS: 25.0000 mg | ORAL_TABLET | Freq: Every day | ORAL | 1 refills | Status: DC

## 2019-09-17 NOTE — Assessment & Plan Note (Addendum)
This is the first visit at [facility]. Patient would like to change PCP because of insurance requirement. She is on: Tresiba 50 u at night, Humalog 30u/30u/10u, (Also supposed to be on Trulicity 1.5 mg weekly but did not get it due to insurance coverage). Last HbA1c 5/4 was 9.1.  -Continue current meds -She is seen Dr.Gherghe for DM and hypothyroidism management who is trying to get her Trulicity approved) -f/u in clinic in 1 month

## 2019-09-17 NOTE — Progress Notes (Signed)
   CC: Blood pressure management  HPI:  [name] is a 64 y.o. 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  She states she needs to change her PCP to have her medical care covered by her insurance.   Past Medical History:  Diagnosis Date  . Diabetes mellitus without complication (HCC)   . Hypertension   . Thyroid disease   . Uncontrolled diabetes mellitus type 2 without complications 07/26/2013   Social history: Patient lives by herself, in [place] She is retired Denies smoking, alcohol use, recreational drug use  Review of Systems:   Constitutional: Negative for chills and fever.  Respiratory: Negative for shortness of breath.   Cardiovascular: Negative for chest pain and leg swelling. Positive with DOE  Gastrointestinal: Negative for abdominal pain, nausea and vomiting.  Neurological: Negative for dizziness and headaches.   Physical Exam:  Vitals:   09/17/19 1436 09/17/19 1557  BP: (!) 193/92 (!) 177/83  Pulse: 73 67  Temp: 98.6 F (37 C)   TempSrc: Oral   SpO2: 100%   Weight: 244 lb 6.4 oz (110.9 kg)   Height: 5' (1.524 m)    Constitutional: Morbid obesity, no acute distress.  Head: Normocephalic and atraumatic.  Eyes: Conjunctivae are normal, EOM nl Cardiovascular: RRR, nl S1S2, no murmur, no LEE,  Respiratory: Effort normal and breath sounds normal. No respiratory distress. No wheezes.  GI: Soft. Bowel sounds are normal. No distension. There is no tenderness, no bruit at renal or abdominal aortic artery area  Neurological: Is alert and oriented x 3  Skin: Not diaphoretic. No erythema  Psychiatric: Normal mood and affect. Behavior is normal. Judgment and thought content normal.   Assessment & Plan:   See Encounters Tab for problem based charting.  Patient discussed with Dr. Criselda Peaches

## 2019-09-17 NOTE — Assessment & Plan Note (Signed)
Patient denies any recent attack.  He mentions that she was told she has asthma.  She has ran out of her albuterol and beclomethasone inhaler.  She mentions that she was not able to get a refill by her pulmonologist and did not ask for refill from her PCP. -Ending refill for albuterol and beclomethasone inhaler

## 2019-09-17 NOTE — Assessment & Plan Note (Addendum)
First-time visit in [facility]. Patient had uncontrolled hypertension on 3 medications: Carvedilol 25 mg twice daily, HCTZ 25 mg daily, losartan 100 mg daily. She reports compliance to her medications.  She reports her systolic blood pressure at home recently has been between 160s-190s. Blood pressure now is 193/92, repeat blood pressure was 177/83.  She denies any headache, dizziness, nausea, vomiting, visual change now. Last BMP 1 month ago was unremarkable. Uncontrolled blood pressure despite being on 3 medications, concerning for secondary hypertension: Such as renovascular, primary hyper Aldo, sleep apnea. -Checking renal ultrasound to evaluate for renal artery stenosis (no bruits on abdominal exam though) -Aldosterone/renin (potassium 3.9, 1 month ago, cannot rule out hyper Aldo) -Sent refill for HCTZ, losartan, carvedilol -Adding amlodipine 5 mg daily -Follow-up in clinic in 2 to 4 weeks -Further evaluation for sleep apnea and consider sleep study establish care -Patient initially declined to sign medical record release form and got angry about why he asked for it because she is concerned we rely on prior doctors note to manage her issues.  I talk to her explained why we need the medical record to be seen here if she would like to establish care at [facility].  She was then convinced to sign it.

## 2019-09-17 NOTE — Patient Instructions (Signed)
It was our pleasure taking care of you in our clinic today.  You were seen to establish care here. Blood pressure has been elevated. I started Amlodipine 5 mg daily for you. Please take your medications as before. I send you to the lab for blood work and also ordered kidney ultrasound as a work up for uncontrolled high blood pressure.  Please come back to clinic in 2-4 weeks or earlier if needed. As always, if having severe symptoms, please seek medical attention at emergency room. Thank you

## 2019-09-18 NOTE — Progress Notes (Signed)
Internal Medicine Clinic Attending  Case discussed with Dr. Masoudi  at the time of the visit.  We reviewed the resident's history and exam and pertinent patient test results.  I agree with the assessment, diagnosis, and plan of care documented in the resident's note.  

## 2019-09-20 LAB — ALDOSTERONE + RENIN ACTIVITY W/ RATIO
ALDOS/RENIN RATIO: >10.8 (ref 0.0–30.0)
ALDOSTERONE: 1.8 ng/dL (ref 0.0–30.0)
Renin: <0.167 ng/mL/hr — ABNORMAL LOW (ref 0.167–5.380)

## 2019-09-21 ENCOUNTER — Telehealth: Payer: Self-pay

## 2019-09-21 NOTE — Telephone Encounter (Signed)
Requesting lab results, please call pt back.  

## 2019-09-21 NOTE — Telephone Encounter (Signed)
PATIENT ASSISTANCE PROGRAM - SHIPMENT  PRODUCT: Humalog Kwikpen QUANTITY: 9 boxes (2 syringes in each boxes = TOTAL 18 syringes)  LOCATION OF PRODUCT: Front refrigerator near front nurses station PATIENT NOTIFIED? Yes PATIENT RESPONSE: States she will stop by the office today to pick up shipment.

## 2019-09-24 ENCOUNTER — Other Ambulatory Visit: Payer: Self-pay | Admitting: Internal Medicine

## 2019-09-24 DIAGNOSIS — I10 Essential (primary) hypertension: Secondary | ICD-10-CM

## 2019-09-24 MED ORDER — CARVEDILOL 25 MG PO TABS: 25.0000 mg | ORAL_TABLET | Freq: Two times a day (BID) | ORAL | 1 refills | Status: DC

## 2019-09-24 NOTE — Progress Notes (Signed)
I called pt and explained the lab results. All her questions addresses. She appreciated the call.  I also made a correction on her med list. She takes Coreg 25 mg BID.

## 2019-10-02 ENCOUNTER — Telehealth: Payer: Self-pay | Admitting: Internal Medicine

## 2019-10-02 NOTE — Telephone Encounter (Signed)
Spoke with the patient and procedure has been updated and sch.

## 2019-10-02 NOTE — Telephone Encounter (Signed)
Pls contact regarding referral [phone]

## 2019-10-04 ENCOUNTER — Ambulatory Visit (INDEPENDENT_AMBULATORY_CARE_PROVIDER_SITE_OTHER): Payer: Medicare Other | Admitting: Internal Medicine

## 2019-10-04 ENCOUNTER — Other Ambulatory Visit: Payer: Self-pay

## 2019-10-04 ENCOUNTER — Encounter: Payer: Self-pay | Admitting: Internal Medicine

## 2019-10-04 VITALS — BP 147/70 | HR 82 | Temp 98.2°F | Ht 60.5 in | Wt 243.3 lb

## 2019-10-04 DIAGNOSIS — I10 Essential (primary) hypertension: Secondary | ICD-10-CM | POA: Diagnosis not present

## 2019-10-04 DIAGNOSIS — B372 Candidiasis of skin and nail: Secondary | ICD-10-CM

## 2019-10-04 MED ORDER — NYSTATIN 100000 UNIT/GM EX CREA: 1.0000 "application " | TOPICAL_CREAM | Freq: Two times a day (BID) | CUTANEOUS | 0 refills | Status: DC | PRN

## 2019-10-04 MED ORDER — SPIRONOLACTONE 25 MG PO TABS: 25.0000 mg | ORAL_TABLET | Freq: Every day | ORAL | 0 refills | Status: DC

## 2019-10-04 NOTE — Patient Instructions (Addendum)
Thank you for allowing [place] to provide your care today. Today we discussed your blood pressure. I stop Amlodipine and start you on Spironolactone. Take 1 table tablet once a day and come back to clinic in 2-4 weeks. Take yiur blood pressure at home and brings the log with you next time.  Take rest of your medications as before.   As always, if having severe symptoms, please seek medical attention at emergency room. Should you have any questions or concerns please call the internal medicine clinic at [phone].    Thank you!     DASH Eating Plan DASH stands for "Dietary Approaches to Stop Hypertension." The DASH eating plan is a healthy eating plan that has been shown to reduce high blood pressure (hypertension). It may also reduce your risk for type 2 diabetes, heart disease, and stroke. The DASH eating plan may also help with weight loss. What are tips for following this plan?  General guidelines  Avoid eating more than 2,300 mg (milligrams) of salt (sodium) a day. If you have hypertension, you may need to reduce your sodium intake to 1,500 mg a day.  Limit alcohol intake to no more than 1 drink a day for nonpregnant women and 2 drinks a day for men. One drink equals 12 oz of beer, 5 oz of wine, or 1 oz of hard liquor.  Work with your health care provider to maintain a healthy body weight or to lose weight. Ask what an ideal weight is for you.  Get at least 30 minutes of exercise that causes your heart to beat faster (aerobic exercise) most days of the week. Activities may include walking, swimming, or biking.  Work with your health care provider or diet and nutrition specialist (dietitian) to adjust your eating plan to your individual calorie needs. Reading food labels   Check food labels for the amount of sodium per serving. Choose foods with less than 5 percent of the Daily Value of sodium. Generally, foods with less than 300 mg of sodium per serving fit into this eating  plan.  To find whole grains, look for the word "whole" as the first word in the ingredient list. Shopping  Buy products labeled as "low-sodium" or "no salt added."  Buy fresh foods. Avoid canned foods and premade or frozen meals. Cooking  Avoid adding salt when cooking. Use salt-free seasonings or herbs instead of table salt or sea salt. Check with your health care provider or pharmacist before using salt substitutes.  Do not fry foods. Cook foods using healthy methods such as baking, boiling, grilling, and broiling instead.  Cook with heart-healthy oils, such as olive, canola, soybean, or sunflower oil. Meal planning  Eat a balanced diet that includes: ? 5 or more servings of fruits and vegetables each day. At each meal, try to fill half of your plate with fruits and vegetables. ? Up to 6-8 servings of whole grains each day. ? Less than 6 oz of lean meat, poultry, or fish each day. A 3-oz serving of meat is about the same size as a deck of cards. One egg equals 1 oz. ? 2 servings of low-fat dairy each day. ? A serving of nuts, seeds, or beans 5 times each week. ? Heart-healthy fats. Healthy fats called Omega-3 fatty acids are found in foods such as flaxseeds and coldwater fish, like sardines, salmon, and mackerel.  Limit how much you eat of the following: ? Canned or prepackaged foods. ? Food that is high in trans  fat, such as fried foods. ? Food that is high in saturated fat, such as fatty meat. ? Sweets, desserts, sugary drinks, and other foods with added sugar. ? Full-fat dairy products.  Do not salt foods before eating.  Try to eat at least 2 vegetarian meals each week.  Eat more home-cooked food and less restaurant, buffet, and fast food.  When eating at a restaurant, ask that your food be prepared with less salt or no salt, if possible. What foods are recommended? The items listed may not be a complete list. Talk with your dietitian about what dietary choices are best  for you. Grains Whole-grain or whole-wheat bread. Whole-grain or whole-wheat pasta. Brown rice. Orpah Cobb. Bulgur. Whole-grain and low-sodium cereals. Pita bread. Low-fat, low-sodium crackers. Whole-wheat flour tortillas. Vegetables Fresh or frozen vegetables (raw, steamed, roasted, or grilled). Low-sodium or reduced-sodium tomato and vegetable juice. Low-sodium or reduced-sodium tomato sauce and tomato paste. Low-sodium or reduced-sodium canned vegetables. Fruits All fresh, dried, or frozen fruit. Canned fruit in natural juice (without added sugar). Meat and other protein foods Skinless chicken or [place]. Ground chicken or [place]. Pork with fat trimmed off. Fish and seafood. Egg whites. Dried beans, peas, or lentils. Unsalted nuts, nut butters, and seeds. Unsalted canned beans. Lean cuts of beef with fat trimmed off. Low-sodium, lean deli meat. Dairy Low-fat (1%) or fat-free (skim) milk. Fat-free, low-fat, or reduced-fat cheeses. Nonfat, low-sodium ricotta or cottage cheese. Low-fat or nonfat yogurt. Low-fat, low-sodium cheese. Fats and oils Soft margarine without trans fats. Vegetable oil. Low-fat, reduced-fat, or light mayonnaise and salad dressings (reduced-sodium). Canola, safflower, olive, soybean, and sunflower oils. Avocado. Seasoning and other foods Herbs. Spices. Seasoning mixes without salt. Unsalted popcorn and pretzels. Fat-free sweets. What foods are not recommended? The items listed may not be a complete list. Talk with your dietitian about what dietary choices are best for you. Grains Baked goods made with fat, such as croissants, muffins, or some breads. Dry pasta or rice meal packs. Vegetables Creamed or fried vegetables. Vegetables in a cheese sauce. Regular canned vegetables (not low-sodium or reduced-sodium). Regular canned tomato sauce and paste (not low-sodium or reduced-sodium). Regular tomato and vegetable juice (not low-sodium or reduced-sodium). Rosita Fire.  Olives. Fruits Canned fruit in a light or heavy syrup. Fried fruit. Fruit in cream or butter sauce. Meat and other protein foods Fatty cuts of meat. Ribs. Fried meat. Tomasa Blase. Sausage. Bologna and other processed lunch meats. Salami. Fatback. Hotdogs. Bratwurst. Salted nuts and seeds. Canned beans with added salt. Canned or smoked fish. Whole eggs or egg yolks. Chicken or [place] with skin. Dairy Whole or 2% milk, cream, and half-and-half. Whole or full-fat cream cheese. Whole-fat or sweetened yogurt. Full-fat cheese. Nondairy creamers. Whipped toppings. Processed cheese and cheese spreads. Fats and oils Butter. Stick margarine. Lard. Shortening. Ghee. Bacon fat. Tropical oils, such as coconut, palm kernel, or palm oil. Seasoning and other foods Salted popcorn and pretzels. Onion salt, garlic salt, seasoned salt, table salt, and sea salt. Worcestershire sauce. Tartar sauce. Barbecue sauce. Teriyaki sauce. Soy sauce, including reduced-sodium. Steak sauce. Canned and packaged gravies. Fish sauce. Oyster sauce. Cocktail sauce. Horseradish that you find on the shelf. Ketchup. Mustard. Meat flavorings and tenderizers. Bouillon cubes. Hot sauce and Tabasco sauce. Premade or packaged marinades. Premade or packaged taco seasonings. Relishes. Regular salad dressings. Where to find more information:  National Heart, Lung, and Blood Institute: PopSteam.is  American Heart Association: www.heart.org Summary  The DASH eating plan is a healthy eating plan that has  been shown to reduce high blood pressure (hypertension). It may also reduce your risk for type 2 diabetes, heart disease, and stroke.  With the DASH eating plan, you should limit salt (sodium) intake to 2,300 mg a day. If you have hypertension, you may need to reduce your sodium intake to 1,500 mg a day.  When on the DASH eating plan, aim to eat more fresh fruits and vegetables, whole grains, lean proteins, low-fat dairy, and heart-healthy  fats.  Work with your health care provider or diet and nutrition specialist (dietitian) to adjust your eating plan to your individual calorie needs. This information is not intended to replace advice given to you by your health care provider. Make sure you discuss any questions you have with your health care provider. Document Revised: 03/11/2017 Document Reviewed: 03/22/2016 Elsevier Patient Education  2020 [employer].

## 2019-10-04 NOTE — Progress Notes (Signed)
   CC: HTN f/u, discussing medication   HPI:  [name] is a 64 y.o. female with PMHx as documented below, presented for HTN f/u and discussing medication. She reports feeling fatigue and having low energy since started on Amlodipine. Please refer to problem based charting for further details and assessment and plan of current problem and chronic medical conditions.  PMHx: HTN, DM, hypothyroidism 2/2 thyroidectomy  Past Medical History:  Diagnosis Date  . Diabetes mellitus without complication (HCC)   . Hypertension   . Thyroid disease   . Uncontrolled diabetes mellitus type 2 without complications 07/26/2013   Meds: Amlodipine 5 mg QD, HCTZ 25 mg QD, Losartan 100 mg QD, Coreg 25 mg BID  Review of Systems:  Review of Systems  Constitutional: Positive for malaise/fatigue. Negative for chills, fever and weight loss.  Gastrointestinal: Negative for abdominal pain, blood in stool, diarrhea, nausea and vomiting.  Genitourinary: Negative for dysuria and urgency.  Neurological: Negative for dizziness and headaches.   Physical Exam:  Vitals:   10/04/19 1506  BP: (!) 147/70  Pulse: 82  Temp: 98.2 F (36.8 C)  TempSrc: Oral  SpO2: 98%  Weight: 243 lb 4.8 oz (110.4 kg)  Height: 5' 0.5" (1.537 m)  Physical Exam Constitutional:      General: She is not in acute distress.    Appearance: She is obese. She is not ill-appearing.  HENT:     Head: Normocephalic and atraumatic.  Cardiovascular:     Rate and Rhythm: Normal rate and regular rhythm.     Pulses: Normal pulses.     Heart sounds: Normal heart sounds. No murmur heard.   Pulmonary:     Breath sounds: No wheezing or rales.  Abdominal:     Palpations: Abdomen is soft.     Tenderness: There is no abdominal tenderness.  Musculoskeletal:        General: No swelling.     Right lower leg: No edema.     Left lower leg: No edema.  Skin:    General: Skin is warm and dry.  Neurological:     General: No focal deficit  present.  Psychiatric:        Mood and Affect: Mood normal.        Behavior: Behavior normal.    Assessment & Plan:   See Encounters Tab for problem based charting.  Patient discussed with Dr. Antony Contras

## 2019-10-04 NOTE — Assessment & Plan Note (Addendum)
Patient reports fatigue and low energy since she started on amlodipine.  We discussed that it is less likely that her symptoms be secondary to amlodipine as is not a common side effect, and can be due to other conditions such as probable sleep apnea, however given her symptoms started just after that, she prefers not to take amlodipine anymore. She does not have any other complaints or red flag such as chest pain, weight loss, new GI, urinary symptoms associated with fatigue.  Also denies any hypotension or hypoglycemia at the time she felt fatigue during the day. Her blood pressure today is 147/70, heart rate of 82. She has a few recordings of BP at home and it was 150s-160s over 80s.   -Holding amlodipine -Start spironolactone 25 mg daily -Continue HCTZ 25 mg daily, losartan 100 mg daily and Coreg 25 mg twice daily -Follow-up in clinic in 2-4 weeks to recheck blood pressure and obtain BMP -Asked patient to check blood pressure at home more regularly (twice a day) and bring the BP log with her next visit -Recommended to perform sleep study.  Patient declines and believes she does not think she has sleep apnea -Follow-up results of renal artery Doppler X ultrasound (ordered last visit.  Scheduled for 6/28)

## 2019-10-04 NOTE — Assessment & Plan Note (Signed)
Sent refill for Nystatin today.

## 2019-10-05 NOTE — Progress Notes (Signed)
Internal Medicine Clinic Attending  Case discussed with Dr. Masoudi  at the time of the visit.  We reviewed the resident's history and exam and pertinent patient test results.  I agree with the assessment, diagnosis, and plan of care documented in the resident's note.  

## 2019-10-08 ENCOUNTER — Ambulatory Visit (HOSPITAL_COMMUNITY): Payer: Medicare Other

## 2019-10-08 ENCOUNTER — Encounter (HOSPITAL_COMMUNITY): Payer: Medicare Other

## 2019-10-09 ENCOUNTER — Telehealth: Payer: Self-pay

## 2019-10-09 DIAGNOSIS — H18513 Endothelial corneal dystrophy, bilateral: Secondary | ICD-10-CM | POA: Diagnosis not present

## 2019-10-09 DIAGNOSIS — H43813 Vitreous degeneration, bilateral: Secondary | ICD-10-CM | POA: Diagnosis not present

## 2019-10-09 DIAGNOSIS — H524 Presbyopia: Secondary | ICD-10-CM | POA: Diagnosis not present

## 2019-10-09 DIAGNOSIS — E113293 Type 2 diabetes mellitus with mild nonproliferative diabetic retinopathy without macular edema, bilateral: Secondary | ICD-10-CM | POA: Diagnosis not present

## 2019-10-09 LAB — HM DIABETES EYE EXAM

## 2019-10-09 NOTE — Telephone Encounter (Signed)
Requesting to speak with a nurse about meds, please call pt back.  

## 2019-10-09 NOTE — Telephone Encounter (Signed)
Pt calls and states when she awoke this morning she had puffiness in her ankle, also she is having constipation and states she thinks she is having trouble at times swallowing.  She is sch appt for 7/1 at 0915 dr Nedra Hai She is advised to call 911 for chest pain, shortness of breath, difficulty swallowing, h/a, weakness. She is agreeable She states she thinks it is due to the spironolactone that was just prescribed. She asks whether she should continue and she is advised if she would like to stop she may or she may continue. She is agreeable

## 2019-10-10 ENCOUNTER — Encounter: Payer: Self-pay | Admitting: *Deleted

## 2019-10-11 ENCOUNTER — Ambulatory Visit (INDEPENDENT_AMBULATORY_CARE_PROVIDER_SITE_OTHER): Payer: Medicare Other | Admitting: Internal Medicine

## 2019-10-11 ENCOUNTER — Encounter: Payer: Self-pay | Admitting: Internal Medicine

## 2019-10-11 DIAGNOSIS — R600 Localized edema: Secondary | ICD-10-CM | POA: Diagnosis not present

## 2019-10-11 DIAGNOSIS — I10 Essential (primary) hypertension: Secondary | ICD-10-CM

## 2019-10-11 MED ORDER — EPLERENONE 50 MG PO TABS: 50.0000 mg | ORAL_TABLET | Freq: Every day | ORAL | 0 refills | Status: DC

## 2019-10-11 NOTE — Progress Notes (Signed)
CC: Leg swelling  HPI: [name] is a 64 y.o. with PMH listed below presenting with complaint of leg swelling. Please see problem based assessment and plan for further details.  Past Medical History:  Diagnosis Date  . Diabetes mellitus without complication (HCC)   . Hypertension   . Thyroid disease   . Uncontrolled diabetes mellitus type 2 without complications 07/26/2013   Review of Systems: Review of Systems  Constitutional: Negative for chills, fever and malaise/fatigue.  Eyes: Negative for blurred vision.  Respiratory: Negative for shortness of breath.   Cardiovascular: Positive for leg swelling. Negative for chest pain and palpitations.  Gastrointestinal: Negative for constipation, diarrhea, nausea and vomiting.  Genitourinary: Negative for dysuria.  Skin: Positive for itching and rash.  Neurological: Negative for dizziness and headaches.     Physical Exam: Vitals:   10/11/19 0922  BP: (!) 145/57  Pulse: 69  Temp: 98.2 F (36.8 C)  TempSrc: Oral  SpO2: 100%  Weight: 245 lb 1.6 oz (111.2 kg)  Height: 5' (1.524 m)   Physical Exam Constitutional:      General: She is not in acute distress.    Appearance: Normal appearance.  HENT:     Head: Normocephalic and atraumatic.     Mouth/Throat:     Mouth: Mucous membranes are moist.     Pharynx: Oropharynx is clear. No oropharyngeal exudate.  Cardiovascular:     Rate and Rhythm: Normal rate and regular rhythm.     Pulses: Normal pulses.     Heart sounds: Normal heart sounds. No murmur heard.   Pulmonary:     Effort: Pulmonary effort is normal.     Breath sounds: Normal breath sounds. No wheezing or rales.  Abdominal:     General: Abdomen is flat. Bowel sounds are normal. There is distension.     Palpations: Abdomen is soft.     Tenderness: There is no abdominal tenderness.  Musculoskeletal:        General: Swelling (trace ankle pitting edema) present. Normal range of motion.  Neurological:      Mental Status: She is alert.    Assessment & Plan:   Hypertension BP Readings from Last 3 Encounters:  10/11/19 (!) 145/57  10/04/19 (!) 147/70  09/17/19 (!) 177/83   Katie Cook is a 64 yo F w/ PMH of HTN, T2DM, Hypothyroidism and asthma presenting to [facility] with lower extremity swelling. She was in her usual state of health until 3 days prior when she noted increased swelling of around her legs. She believes it is due to spironolactone. Mentions that she noted when she pushes her ankle in, the area remains indented and shows me a picture. She mentions that she was told to stop amlodipine per her PCP but continued to take it until couple days ago. Spironolactone she has stopped completely since noticing change in her legs.  A/P Present w/ lower extremity pitting edema likely due to amlodipine. Currently very minimal edema noted. BP also improved from prior. She did have recorded BP with systolics in 170s on her phone. Likely will continue to be uncontrolled on hctz and losartan alone. Reassured Katie Cook that the swelling is due to amlodipine and not spironolactone but she continues to endorse significant fears regarding continuing spironolactone.  - Stop amlodipine, spironolactone - Start eplerenone 50mg  daily - C/w HCTz 25mg , losratan 100mg , Coreg 25mg  BID - F/u in 2 weeks for BP check & labs - F/u renal artery doppler (scheduled for endo of July)  Patient discussed with Dr. Criselda Peaches  -Judeth Cornfield, PGY3 [facility] Internal Medicine Pager: [phone]

## 2019-10-11 NOTE — Assessment & Plan Note (Signed)
BP Readings from Last 3 Encounters:  10/11/19 (!) 145/57  10/04/19 (!) 147/70  09/17/19 (!) 177/83   Katie Cook is a 64 yo F w/ PMH of HTN, T2DM, Hypothyroidism and asthma presenting to [facility] with lower extremity swelling. She was in her usual state of health until 3 days prior when she noted increased swelling of around her legs. She believes it is due to spironolactone. Mentions that she noted when she pushes her ankle in, the area remains indented and shows me a picture. She mentions that she was told to stop amlodipine per her PCP but continued to take it until couple days ago. Spironolactone she has stopped completely since noticing change in her legs.  A/P Present w/ lower extremity pitting edema likely due to amlodipine. Currently very minimal edema noted. BP also improved from prior. She did have recorded BP with systolics in 170s on her phone. Likely will continue to be uncontrolled on hctz and losartan alone. Reassured Katie Cook that the swelling is due to amlodipine and not spironolactone but she continues to endorse significant fears regarding continuing spironolactone.  - Stop amlodipine, spironolactone - Start eplerenone 50mg  daily - C/w HCTz 25mg , losratan 100mg , Coreg 25mg  BID - F/u in 2 weeks for BP check & labs - F/u renal artery doppler (scheduled for endo of July)

## 2019-10-11 NOTE — Patient Instructions (Addendum)
Thank you for allowing [place] to provide your care today. Today we discussed your blood pressure    I have ordered no labs for you. I will call if any are abnormal.    Today we made the following changes to your medications.    Please stop amlodipine 5mg , spironolactone 25mg   Please start Inspra 50mg  daily  Please follow-up in 2 weeks.    Should you have any questions or concerns please call the internal medicine clinic at [phone].    Eplerenone Oral Tablets What is this medicine? EPLERENONE (e PLER en one) is used to treat high blood pressure. This medicine is also used to improve symptoms of heart failure. This medicine may be used for other purposes; ask your health care provider or pharmacist if you have questions. COMMON BRAND NAME(S): Inspra What should I tell my health care provider before I take this medicine? They need to know if you have any of these conditions:  diabetes  high blood level of potassium  if you are on a special diet, such as a low-salt diet and are using dietary salt substitutes  kidney disease  liver disease  an unusual or allergic reaction to eplerenone, other medicines, foods, dyes, or preservatives  pregnant or trying to get pregnant  breast-feeding How should I use this medicine? Take this medicine by mouth with a glass of water. Follow the directions on the prescription label. You may take this medicine with or without food. Take your doses at regular intervals. Do not take your medicine more often than directed. Do not stop taking except on the advice of your doctor or health care professional. Talk to your pediatrician regarding the use of this medicine in children. Special care may be needed. Overdosage: If you think you have taken too much of this medicine contact a poison control center or emergency room at once. NOTE: This medicine is only for you. Do not share this medicine with others. What if I miss a dose? If you miss a dose, take  it as soon as you can. If it is almost time for your next dose, take only that dose. Do not take double or extra doses. What may interact with this medicine? Do not take this medicine with any of the following medications:  boceprevir  certain antibiotics like chloramphenicol, clarithromycin, dalfopristin; quinupristin, and telithromycin  certain diuretics like amiloride, spironolactone, and triamterene  certain medicines for fungal infections like itraconazole, ketoconazole, posaconazole, and voriconazole  certain medicines for HIV or AIDS like atazanavir, cobicistat, darunavir, delavirdine, fosamprenavir, indinavir, nelfinavir, ritonavir, saquinavir boosted with ritonavir, and tipranavir  conivaptan  grapefruit and grapefruit juice  idelalisib  mifepristone  nefazodone  potassium salts or supplements This medicine may also interact with the following medications:  certain medicines for high blood pressure like enalapril, candesartan, lisinopril, and valsartan  erythromycin  fluconazole  lithium  NSAIDs, medicines for pain and inflammation, like ibuprofen or naproxen  verapamil This list may not describe all possible interactions. Give your health care provider a list of all the medicines, herbs, non-prescription drugs, or dietary supplements you use. Also tell them if you smoke, drink alcohol, or use illegal drugs. Some items may interact with your medicine. What should I watch for while using this medicine? Visit your doctor or health care professional for regular checks on your progress. Check your blood pressure as directed. Ask your doctor or health care professional what your blood pressure should be and when you should contact him or her. You  may need to be on a special diet while taking this medicine. Ask your doctor. Also, ask how many glasses of fluid you need to drink each day. You must not get dehydrated. You may get drowsy or dizzy. Do not drive, use machinery,  or do anything that needs mental alertness until you know how this medicine affects you. Do not stand or sit up quickly, especially if you are an older patient. This reduces the risk of dizzy or fainting spells. Alcohol may interfere with the effect of this medicine. Avoid alcoholic drinks. What side effects may I notice from receiving this medicine? Side effects that you should report to your doctor or health care professional as soon as possible:  allergic reactions like skin rash, itching or hives, swelling of the face, lips, or tongue  chest pain  confusion  enlarged breasts or breast pain in males  fast or irregular heartbeat, palpitations  increased hair growth in females  irregular menstrual periods  sexual difficulty  unusually weak or tired Side effects that usually do not require medical attention (report to your doctor or health care professional if they continue or are bothersome):  cough  diarrhea  fatigue  headache  stomach pain This list may not describe all possible side effects. Call your doctor for medical advice about side effects. You may report side effects to FDA at 1-800-FDA-1088. Where should I keep my medicine? Keep out of the reach of children. Store at room temperature between 15 and 30 degrees C (59 and 86 degrees F). Throw away any unused medicine after the expiration date. NOTE: This sheet is a summary. It may not cover all possible information. If you have questions about this medicine, talk to your doctor, pharmacist, or health care provider.  2020 Elsevier/Gold Standard (2016-04-28 14:22:43)

## 2019-10-12 NOTE — Progress Notes (Signed)
Internal Medicine Clinic Attending  Case discussed with Dr. Lee  At the time of the visit.  We reviewed the resident's history and exam and pertinent patient test results.  I agree with the assessment, diagnosis, and plan of care documented in the resident's note.    

## 2019-10-13 ENCOUNTER — Encounter: Payer: Self-pay | Admitting: Internal Medicine

## 2019-10-16 ENCOUNTER — Encounter (HOSPITAL_COMMUNITY): Payer: Medicare Other

## 2019-10-31 ENCOUNTER — Ambulatory Visit (HOSPITAL_COMMUNITY)
Admission: RE | Admit: 2019-10-31 | Discharge: 2019-10-31 | Disposition: A | Discharge status: Home / Self Care | Payer: Medicare Other | Source: Ambulatory Visit | Attending: Internal Medicine | Admitting: Internal Medicine

## 2019-10-31 ENCOUNTER — Other Ambulatory Visit: Payer: Self-pay

## 2019-10-31 DIAGNOSIS — N644 Mastodynia: Secondary | ICD-10-CM | POA: Diagnosis not present

## 2019-10-31 DIAGNOSIS — J45909 Unspecified asthma, uncomplicated: Secondary | ICD-10-CM | POA: Diagnosis not present

## 2019-10-31 DIAGNOSIS — R928 Other abnormal and inconclusive findings on diagnostic imaging of breast: Secondary | ICD-10-CM | POA: Diagnosis not present

## 2019-10-31 NOTE — Progress Notes (Signed)
Renal artery duplex completed. Refer to "CV Proc" under chart review to view preliminary results.  10/31/2019 11:08 AM Eula Fried., MHA, RVT, RDCS, RDMS

## 2019-11-01 ENCOUNTER — Encounter: Payer: Self-pay | Admitting: Internal Medicine

## 2019-11-01 ENCOUNTER — Ambulatory Visit (INDEPENDENT_AMBULATORY_CARE_PROVIDER_SITE_OTHER): Payer: Medicare Other | Admitting: Internal Medicine

## 2019-11-01 VITALS — BP 146/86 | HR 73 | Temp 98.1°F | Ht 61.0 in | Wt 241.8 lb

## 2019-11-01 DIAGNOSIS — E119 Type 2 diabetes mellitus without complications: Secondary | ICD-10-CM

## 2019-11-01 DIAGNOSIS — R5383 Other fatigue: Secondary | ICD-10-CM | POA: Insufficient documentation

## 2019-11-01 DIAGNOSIS — E890 Postprocedural hypothyroidism: Secondary | ICD-10-CM | POA: Diagnosis not present

## 2019-11-01 DIAGNOSIS — L282 Other prurigo: Secondary | ICD-10-CM | POA: Insufficient documentation

## 2019-11-01 DIAGNOSIS — I10 Essential (primary) hypertension: Secondary | ICD-10-CM | POA: Diagnosis not present

## 2019-11-01 MED ORDER — TRIAMCINOLONE ACETONIDE 0.5 % EX OINT: 1.0000 "application " | TOPICAL_OINTMENT | Freq: Two times a day (BID) | CUTANEOUS | 1 refills | Status: DC

## 2019-11-01 MED ORDER — TRIAMCINOLONE ACETONIDE 0.5 % EX OINT: 1.0000 "application " | TOPICAL_OINTMENT | Freq: Two times a day (BID) | CUTANEOUS | 0 refills | Status: DC

## 2019-11-01 NOTE — Assessment & Plan Note (Signed)
Katie Cook is a 64 yo F w/ PMH of HTN, hypothyroidism, T2DM presenting with complaint of fatigue and weakness. She mentions that over the last week or so she has been experiencing worsening symptoms of fatigue despite getting adequate sleep. She mentions that she has not noticed any significant inciting event but wonders if her thyroid level is not good. She denies any chest pain, dyspnea, fevers, chills, palpitations, weight changes. She denies any cold/heat intolerance, hair changes.  A/P Presenting with fatigue currently has multiple co-morbidities that need better control such as her diabetes and hypertension. Also on the differential includes OSA (STOP-BANG score of 4) and anemia.  - Check cbc

## 2019-11-01 NOTE — Assessment & Plan Note (Addendum)
Lab Results  Component Value Date   TSH 0.85 08/14/2019   Has hx of post-surgical hypothyroidism after compression symptoms from benign thyroid mass. On levothyroxine . Currently endorsing some fatigue. Denies medication non-adherence. Follows with Dr.Gherge with endocrionology. Previously wnl TSH 3 months prior.  - Check TSH

## 2019-11-01 NOTE — Assessment & Plan Note (Signed)
Presents w/ multiple raised erythematous lesions on arms, foot and face. Mentions acute onset. Endorsing significant pruritus. Appears to be mosquito bites but cannot r/o bed bugs. Advised to perform through check of bedframe and sheets. Triamcinolone topical for pruritus  - Check for bed bugs - topical kenalog - Use bug repellant when outside, keep windows closed at night

## 2019-11-01 NOTE — Progress Notes (Signed)
   CC: hypertension  HPI: Ms.Jaretzy E Fontanilla is a 64 y.o. with PMH listed below presenting with complaint of hypertension. Please see problem based assessment and plan for further details.  Past Medical History:  Diagnosis Date  . Diabetes mellitus without complication (HCC)   . Hypertension   . Thyroid disease   . Uncontrolled diabetes mellitus type 2 without complications 07/26/2013   Review of Systems: Review of Systems  Constitutional: Negative for chills, fever and malaise/fatigue.  Eyes: Negative for blurred vision.  Respiratory: Negative for shortness of breath.   Cardiovascular: Negative for chest pain, palpitations and leg swelling.  Gastrointestinal: Negative for nausea and vomiting.  Skin: Positive for itching and rash.  Neurological: Negative for dizziness and headaches.  All other systems reviewed and are negative.    Physical Exam: Vitals:   11/01/19 1000 11/01/19 1026  BP: (!) 154/90 (!) 146/86  Pulse: 73   Temp: 98.1 F (36.7 C)   TempSrc: Oral   SpO2: 98%   Weight: 241 lb 12.8 oz (109.7 kg)   Height: 5\' 1"  (1.549 m)     Gen: Well-developed, well nourished, NAD HEENT: NCAT head, hearing intact CV: RRR, S1, S2 normal Pulm: CTAB, No rales, no wheezes Extm: ROM intact, Peripheral pulses intact, No peripheral edema Skin: Dry, Warm, normal turgor, no wounds, no rashes, no lesions  Assessment & Plan:   Fatigue Ms.Freshour is a 64 yo F w/ PMH of HTN, hypothyroidism, T2DM presenting with complaint of fatigue and weakness. She mentions that over the last week or so she has been experiencing worsening symptoms of fatigue despite getting adequate sleep. She mentions that she has not noticed any significant inciting event but wonders if her thyroid level is not good. She denies any chest pain, dyspnea, fevers, chills, palpitations, weight changes. She denies any cold/heat intolerance, hair changes.  A/P Presenting with fatigue currently has multiple  co-morbidities that need better control such as her diabetes and hypertension. Also on the differential includes OSA (STOP-BANG score of 4) and anemia.  - Check cbc  Hypothyroidism Lab Results  Component Value Date   TSH 0.85 08/14/2019   Has hx of post-surgical hypothyroidism after compression symptoms from benign thyroid mass. On levothyroxine 10/14/2019. Currently endorsing some fatigue. Denies medication non-adherence. Follows with Dr.Gherge with endocrionology. Previously wnl TSH 3 months prior.  - Check TSH  Pruritic rash Presents w/ multiple raised erythematous lesions on arms, foot and face. Mentions acute onset. Endorsing significant pruritus. Appears to be mosquito bites but cannot r/o bed bugs. Advised to perform through check of bedframe and sheets. Triamcinolone topical for pruritus  - Check for bed bugs - topical kenalog - Use bug repellant when outside, keep windows closed at night  Hypertension BP Readings from Last 3 Encounters:  11/01/19 (!) 146/86  10/11/19 (!) 145/57  10/04/19 (!) 147/70   Mrs.Aaron presents for f/u visit for her uncontrolled hypertension. She also recorded her blood pressure with home blood pressure monitor which shows consistently elevated blood pressure around 150s systolic. Renal artery doppler showing no significant stenosis. She is currently on hctz and losartan. Previously prescribed amlodipine and spironolactone as well but patient d/ced due to side effects. Prescribed eplerenone as alternative but mentions that the co-pay is too high.  - C/w losartan, hctz - Care management referral for medication cost assistance - Start eplerenone 50mg  once able to afford     Patient discussed with Dr. 10/06/19  - , PGY3 [facility] Internal Medicine Pager: [phone]

## 2019-11-01 NOTE — Assessment & Plan Note (Signed)
BP Readings from Last 3 Encounters:  11/01/19 (!) 146/86  10/11/19 (!) 145/57  10/04/19 (!) 147/70   Katie Cook presents for f/u visit for her uncontrolled hypertension. She also recorded her blood pressure with home blood pressure monitor which shows consistently elevated blood pressure around 150s systolic. Renal artery doppler showing no significant stenosis. She is currently on hctz and losartan. Previously prescribed amlodipine and spironolactone as well but patient d/ced due to side effects. Prescribed eplerenone as alternative but mentions that the co-pay is too high.  - C/w losartan, hctz - Care management referral for medication cost assistance - Start eplerenone 50mg  once able to afford

## 2019-11-01 NOTE — Patient Instructions (Addendum)
Thank you for allowing [place] to provide your care today. Today we discussed your fatigue and rash and blood pressure    I have ordered bmp, tsh, cbc labs for you. I will call if any are abnormal.    Today we made the following changes to your medications.    Please use kenalog as needed for your itchy skin  Please follow-up in 1 months.    Should you have any questions or concerns please call the internal medicine clinic at [phone].    Triamcinolone skin cream, ointment, lotion, or aerosol What is this medicine? TRIAMCINOLONE (trye am SIN oh lone) is a corticosteroid. It is used on the skin to reduce swelling, redness, itching, and allergic reactions. This medicine may be used for other purposes; ask your health care provider or pharmacist if you have questions. COMMON BRAND NAME(S): Aristocort, Aristocort A, Aristocort HP, Cinalog, Cinolar, DERMASORB TA Complete, Flutex, Kenalog, Pediaderm TA, Sila III, SP Rx 228, Triacet, Trianex, Triderm What should I tell my health care provider before I take this medicine? They need to know if you have any of these conditions:  any active infection  large areas of burned or damaged skin  skin wasting or thinning  an unusual or allergic reaction to triamcinolone, corticosteroids, other medicines, foods, dyes, or preservatives  pregnant or trying to get pregnant  breast-feeding How should I use this medicine? This medicine is for external use only. Do not take by mouth. Follow the directions on the prescription label. Wash your hands before and after use. Apply a thin film of medicine to the affected area. Do not cover with a bandage or dressing unless your doctor or health care professional tells you to. Do not use on healthy skin or over large areas of skin. Do not get this medicine in your eyes. If you do, rinse out with plenty of cool tap water. It is important not to use more medicine than prescribed. Do not use your medicine more often than  directed. Talk to your pediatrician regarding the use of this medicine in children. Special care may be needed. Elderly patients are more likely to have damaged skin through aging, and this may increase side effects. This medicine should only be used for brief periods and infrequently in older patients. Overdosage: If you think you have taken too much of this medicine contact a poison control center or emergency room at once. NOTE: This medicine is only for you. Do not share this medicine with others. What if I miss a dose? If you miss a dose, use it as soon as you can. If it is almost time for your next dose, use only that dose. Do not use double or extra doses. What may interact with this medicine? Interactions are not expected. This list may not describe all possible interactions. Give your health care provider a list of all the medicines, herbs, non-prescription drugs, or dietary supplements you use. Also tell them if you smoke, drink alcohol, or use illegal drugs. Some items may interact with your medicine. What should I watch for while using this medicine? Tell your doctor or health care professional if your symptoms do not start to get better within one week. Do not use for more than 14 days. Do not use on healthy skin or over large areas of skin. Tell your doctor or health care professional if you are exposed to anyone with measles or chickenpox, or if you develop sores or blisters that do not heal properly. Do  not use an airtight bandage to cover the affected area unless your doctor or health care professional tells you to. If you are to cover the area, follow the instructions carefully. Covering the area where the medicine is applied can increase the amount that passes through the skin and increases the risk of side effects. If treating the diaper area of a child, avoid covering the treated area with tight-fitting diapers or plastic pants. This may increase the amount of medicine that passes  through the skin and increase the risk of serious side effects. What side effects may I notice from receiving this medicine? Side effects that you should report to your doctor or health care professional as soon as possible:  burning or itching of the skin  dark red spots on the skin  infection  painful, red, pus filled blisters in hair follicles  thinning of the skin, sunburn more likely especially on the face Side effects that usually do not require medical attention (report to your doctor or health care professional if they continue or are bothersome):  dry skin, irritation  unusual increased growth of hair on the face or body This list may not describe all possible side effects. Call your doctor for medical advice about side effects. You may report side effects to FDA at 1-800-FDA-1088. Where should I keep my medicine? Keep out of the reach of children. Store at room temperature between 15 and 30 degrees C (59 and 86 degrees F). Do not freeze. Throw away any unused medicine after the expiration date. NOTE: This sheet is a summary. It may not cover all possible information. If you have questions about this medicine, talk to your doctor, pharmacist, or health care provider.  2020 Elsevier/Gold Standard (2017-12-29 10:50:30)

## 2019-11-02 ENCOUNTER — Telehealth: Payer: Self-pay | Admitting: Internal Medicine

## 2019-11-02 DIAGNOSIS — E890 Postprocedural hypothyroidism: Secondary | ICD-10-CM

## 2019-11-02 LAB — BMP8+ANION GAP
Anion Gap: 16 mmol/L (ref 10.0–18.0)
BUN/Creatinine Ratio: 28 (ref 12–28)
BUN: 13 mg/dL (ref 8–27)
CO2: 24 mmol/L (ref 20–29)
Calcium: 9.2 mg/dL (ref 8.7–10.3)
Chloride: 102 mmol/L (ref 96–106)
Creatinine, Ser: 0.46 mg/dL — ABNORMAL LOW (ref 0.57–1.00)
GFR calc Af Amer: 122 mL/min/{1.73_m2} (ref >59)
GFR calc non Af Amer: 105 mL/min/{1.73_m2} (ref >59)
Glucose: 134 mg/dL — ABNORMAL HIGH (ref 65–99)
Potassium: 4.1 mmol/L (ref 3.5–5.2)
Sodium: 142 mmol/L (ref 134–144)

## 2019-11-02 LAB — CBC
Hematocrit: 38.9 % (ref 34.0–46.6)
Hemoglobin: 12.9 g/dL (ref 11.1–15.9)
MCH: 25.6 pg — ABNORMAL LOW (ref 26.6–33.0)
MCHC: 33.2 g/dL (ref 31.5–35.7)
MCV: 77 fL — ABNORMAL LOW (ref 79–97)
Platelets: 273 10*3/uL (ref 150–450)
RBC: 5.04 x10E6/uL (ref 3.77–5.28)
RDW: 14.1 % (ref 11.7–15.4)
WBC: 9.5 10*3/uL (ref 3.4–10.8)

## 2019-11-02 LAB — TSH: TSH: 0.339 u[IU]/mL — ABNORMAL LOW (ref 0.450–4.500)

## 2019-11-02 MED ORDER — LEVOTHYROXINE SODIUM 150 MCG PO TABS: 150.0000 ug | ORAL_TABLET | Freq: Every day | ORAL | 3 refills | Status: DC

## 2019-11-02 NOTE — Telephone Encounter (Signed)
Noted to have low TSH @ 0.34. Denies any biotin use. Advised to decrease dose of her levothyroxine. Re-sent script for .Ms.Carbonell expressed understanding.

## 2019-11-02 NOTE — Progress Notes (Signed)
Internal Medicine Clinic Attending  Case discussed with Dr. Lee  At the time of the visit.  We reviewed the resident's history and exam and pertinent patient test results.  I agree with the assessment, diagnosis, and plan of care documented in the resident's note.    

## 2019-11-05 ENCOUNTER — Telehealth: Payer: Self-pay

## 2019-11-05 NOTE — Telephone Encounter (Signed)
Forms for [facility] PAP renewal application filled out, signed by Dr. Elvera Lennox and faxed to [facility] with confirmation.

## 2019-11-15 ENCOUNTER — Telehealth: Payer: Self-pay

## 2019-11-15 NOTE — Telephone Encounter (Signed)
Left a voicemail for patient regarding Inspra medication assistance.  Requested that patient create a profile at [email or web address] to initiate the application process.  Once the patient portion is completed I asked that she bring application to office so the provider can complete the provider portion.

## 2019-12-11 ENCOUNTER — Telehealth: Payer: Self-pay

## 2019-12-11 NOTE — Telephone Encounter (Signed)
Humalog PAP refill order faxed.

## 2019-12-11 NOTE — Telephone Encounter (Signed)
Trulicity shipment from PAP arrived for patient to pick up.

## 2019-12-14 ENCOUNTER — Ambulatory Visit: Payer: Medicare Other | Admitting: Internal Medicine

## 2019-12-18 ENCOUNTER — Other Ambulatory Visit: Payer: Self-pay | Admitting: Internal Medicine

## 2019-12-18 DIAGNOSIS — I10 Essential (primary) hypertension: Secondary | ICD-10-CM

## 2019-12-19 NOTE — Telephone Encounter (Signed)
Pt has picked up trulicity

## 2019-12-21 ENCOUNTER — Telehealth: Payer: Self-pay

## 2019-12-21 NOTE — Telephone Encounter (Signed)
Renewal application due.  Patient notified and she will come by today to bring her proof of income and fill out her portion.

## 2019-12-25 ENCOUNTER — Telehealth: Payer: Self-pay

## 2019-12-25 NOTE — Telephone Encounter (Signed)
[employer] Patient Assistance Program Re-enrollment request for Katie Cook has been filled out, signed by Dr. Elvera Lennox and faxed back to [employer] with confirmation.

## 2019-12-26 NOTE — Telephone Encounter (Signed)
Humalog is here for pick up Left message to let her know it is available here

## 2019-12-28 ENCOUNTER — Other Ambulatory Visit: Payer: Self-pay

## 2019-12-28 ENCOUNTER — Other Ambulatory Visit: Payer: Self-pay | Admitting: Internal Medicine

## 2019-12-28 ENCOUNTER — Encounter: Payer: Self-pay | Admitting: Internal Medicine

## 2019-12-28 ENCOUNTER — Ambulatory Visit (INDEPENDENT_AMBULATORY_CARE_PROVIDER_SITE_OTHER): Payer: Medicare Other | Admitting: Internal Medicine

## 2019-12-28 VITALS — BP 154/72 | HR 87 | Temp 98.4°F | Wt 240.0 lb

## 2019-12-28 DIAGNOSIS — Z794 Long term (current) use of insulin: Secondary | ICD-10-CM

## 2019-12-28 DIAGNOSIS — E1165 Type 2 diabetes mellitus with hyperglycemia: Secondary | ICD-10-CM

## 2019-12-28 DIAGNOSIS — J3089 Other allergic rhinitis: Secondary | ICD-10-CM

## 2019-12-28 DIAGNOSIS — J019 Acute sinusitis, unspecified: Secondary | ICD-10-CM | POA: Diagnosis not present

## 2019-12-28 DIAGNOSIS — B9689 Other specified bacterial agents as the cause of diseases classified elsewhere: Secondary | ICD-10-CM | POA: Insufficient documentation

## 2019-12-28 MED ORDER — LEVOCETIRIZINE DIHYDROCHLORIDE 5 MG PO TABS: 5.0000 mg | ORAL_TABLET | Freq: Every evening | ORAL | 5 refills | Status: DC

## 2019-12-28 MED ORDER — MOMETASONE FUROATE 50 MCG/ACT NA SUSP: 2.0000 | Freq: Every day | NASAL | 2 refills | Status: DC

## 2019-12-28 NOTE — Patient Instructions (Signed)
Thank you for allowing [place] to provide your care today. Today we discussed your rhinitis and     I have ordered the following labs for you:    I will call if any are abnormal.    Today we made the following changes to your medications:   Please START taking  xyzal 5 mg - take one tablet every evening  Nasonex - spray two times into the nostrils daily   You can also try a saline nasal spray to help with symptoms.   Please STOP taking   zyrtec  Please follow-up in two weeks for yearly check-up.  If you develop fever or worsening symptoms in the next 10 days, please call the clinic and ask them to send me a message.   Please call the internal medicine center clinic if you have any questions or concerns, we may be able to help and keep you from a long and expensive emergency room wait. Our clinic and after hours phone number is [phone], the best time to call is Monday through Friday 9 am to 4 pm but there is always someone available 24/7 if you have an emergency. If you need medication refills please notify your pharmacy one week in advance and they will send [place] a request.

## 2019-12-28 NOTE — Telephone Encounter (Signed)
Patient did not fill out her insurance info portion despite it being highlighted for her to do so.  I have looked up the info and filled it in and faxed. We do not have free vouchers but she can pick up a sample here.

## 2019-12-28 NOTE — Progress Notes (Signed)
   CC: sinus pressure  HPI:  [name] is a 64 y.o. with PMH as below.   Please see A&P for assessment of the patient's acute and chronic medical conditions.   She has had three days of sinus pressure, sore throat, left ear pain, and post-nasal drip in the mornings. Mild congestion. No pain with ocular motion, fever, chills, sob, cough, chest pain. She states this occurs yearly or every other year and often will turn into bacterial infection. She is hoping to start antibiotics now before it gets worse. She takes zyrtec and doesn't feel like this helps as much. Used to be on xyzal and feels this may have worked for her better.   Past Medical History:  Diagnosis Date  . Diabetes mellitus without complication (HCC)   . Hypertension   . Thyroid disease   . Uncontrolled diabetes mellitus type 2 without complications 07/26/2013   Review of Systems:   10 point ROS negative except as noted in HPI  Physical Exam: Constitution: NAD, appears stated age HENT: [place]/AT, right and left TM without effusion or erythema; ear canal normal, slightly TTP over left ear canal, no exudate or cobblestoning of pharynx; tenderness over frontal and maxillary sinus, eom intact and pain without movement.  Eyes: no icterus or injection Cardio: RRR, no m/r/g, no LE edema  Respiratory: CTA, no w/r/r Abdominal: NTTP, soft, non-distended MSK: moving all extremities Neuro: normal affect, a&ox3 Skin: c/d/i    Vitals:   12/28/19 1505  BP: (!) 154/72  Pulse: 87  Temp: 98.4 F (36.9 C)  TempSrc: Oral  SpO2: 99%  Weight: 240 lb (108.9 kg)     Assessment & Plan:   See Encounters Tab for problem based charting.  Patient discussed with Dr. Cleda Daub

## 2019-12-28 NOTE — Assessment & Plan Note (Signed)
Symptoms consistent with allergic rhinosinusitis.  - Discussed that she does not currently need an antibiotic and return precautions for antibiotic if symptoms worsen with fever or worsen and last >10 days. - nasonex bid - switch zyrtec to xyzal - f/u in two weeks for healthcare maintenance and to address other chronic issues as it appears she was supposed to follow-up in August.

## 2019-12-28 NOTE — Telephone Encounter (Signed)
Pt states that yesterday she found out with [employer] that the re enrollment form is missing data so they sent it back to [place].   And she is asking for [place] to send a immediate supply voucher [facility] for the tresiba as she is already out

## 2019-12-28 NOTE — Telephone Encounter (Signed)
Medication Refill Request  Did you call your pharmacy and request this refill first? yes  . If patient has not contacted pharmacy first, instruct them to do so for future refills.  . Remind them that contacting the pharmacy for their refill is the quickest method to get the refill.  . Refill policy also stated that it will take anywhere between 24-72 hours to receive the refill.   Name of medication? Evaristo Bury  Is this a 90 day supply? 1 month will be free due to voucher sent to [facility] by [facility]  Name and location of pharmacy? [facility] on [address]

## 2020-01-01 ENCOUNTER — Telehealth: Payer: Self-pay

## 2020-01-01 MED ORDER — TRESIBA FLEXTOUCH 200 UNIT/ML ~~LOC~~ SOPN: 50.0000 [IU] | PEN_INJECTOR | Freq: Every day | SUBCUTANEOUS | 0 refills | Status: DC

## 2020-01-01 NOTE — Addendum Note (Signed)
Addended by: Darliss Ridgel I on: 01/01/2020 02:12 PM   Modules accepted: Orders

## 2020-01-01 NOTE — Telephone Encounter (Signed)
Patient has been approved for [employer] through 03/11/2020.

## 2020-01-01 NOTE — Telephone Encounter (Signed)
Please see previous note. Pt requesting Tresiba sent to [facility] on [address]  due to having a voucher.

## 2020-01-01 NOTE — Telephone Encounter (Signed)
Pt called to confirm Tresiba to be called in to [facility]. Pt wants to use voucher & leave the sample in office for another pt if needed.

## 2020-01-01 NOTE — Telephone Encounter (Signed)
RX sent

## 2020-01-01 NOTE — Telephone Encounter (Signed)
This is not a [facility] Pt

## 2020-01-03 ENCOUNTER — Telehealth: Payer: Self-pay

## 2020-01-03 NOTE — Telephone Encounter (Signed)
ERROR

## 2020-01-04 ENCOUNTER — Ambulatory Visit (INDEPENDENT_AMBULATORY_CARE_PROVIDER_SITE_OTHER): Payer: Medicare Other | Admitting: Internal Medicine

## 2020-01-04 ENCOUNTER — Other Ambulatory Visit: Payer: Self-pay

## 2020-01-04 DIAGNOSIS — R0781 Pleurodynia: Secondary | ICD-10-CM | POA: Insufficient documentation

## 2020-01-04 DIAGNOSIS — B9689 Other specified bacterial agents as the cause of diseases classified elsewhere: Secondary | ICD-10-CM | POA: Diagnosis not present

## 2020-01-04 DIAGNOSIS — J019 Acute sinusitis, unspecified: Secondary | ICD-10-CM | POA: Diagnosis not present

## 2020-01-04 MED ORDER — AMOXICILLIN-POT CLAVULANATE 875-125 MG PO TABS: 1.0000 | ORAL_TABLET | Freq: Two times a day (BID) | ORAL | 0 refills | Status: AC

## 2020-01-04 NOTE — Assessment & Plan Note (Signed)
She is having pain on the right side/back. If she moves it feels like it's pulling. It improves when she lays down or walks. She denies dysuria, increased urination, no sensation she still needs to urinate when she is done. She is having some nausea, no diarrhea. The pain is worse when she coughs, which has been increasing due to rhinosinusitis. She is drinking lots of fluids.   Doubt UTI or pyelo. Nausea 2/2 bacteria rhinosinusitis. Likely muscle soreness with cough. Encouraged tylenol and ice and to call the clinic if back pain worsens or she develops urinary symptoms.

## 2020-01-04 NOTE — Assessment & Plan Note (Addendum)
She is feeling worse than last week. Her throat is more dry, and she has a worsening dry cough. Her head is hurting a lot and has a lot of pressure and she has pain in her left ear. She is having increased sinus pressure behind her eyes. She endorses some nausea, no diarrhea or change in taste. She does not think she has had a fever but has not checked. She denies chills or sweats or chest pain. The nasal spray has seemed to help a little bit. She is having a little shortness of breath when she goes up steps.   - augmentin 875-125 mg bid for 5 days  - she has information on how to obtain covid test and will do so

## 2020-01-04 NOTE — Progress Notes (Signed)
   CC: sinus pain and cough  This is a telephone encounter between Katie Cook and Katie Cook on 01/04/2020 for sinus pain and cough. The visit was conducted with the patient located at home and Katie Cook at [facility]. The patient's identity was confirmed using their DOB and current address. The patient has consented to being evaluated through a telephone encounter and understands the associated risks (an examination cannot be done and the patient may need to come in for an appointment) / benefits (allows the patient to remain at home, decreasing exposure to coronavirus). I personally spent 16 minutes on medical discussion.   HPI:  Katie Cook is a 64 y.o. with PMH as below.   Please see A&P for assessment of the patient's acute and chronic medical conditions.   She is feeling worse than last week. Her throat is more dry, and she has a worsening dry cough. Her head is hurting a lot and has a lot of pressure and she has pain in her left ear. She is having increased sinus pressure behind her eyes. She endorses some nausea, no diarrhea or change in taste. She does not think she has had a fever but has not checked. She denies chills or sweats or chest pain. The nasal spray has seemed to help a little bit. She is having a little shortness of breath when she goes up steps.   - augmentin 875-125 mg bid for 5 days  - she has information on how to obtain covid test and will do so   She is having pain on the right side/back. If she moves it feels like it's pulling. It improves when she lays down or walks. She denies dysuria, increased urination, no sensation she still needs to urinate when she is done. She is having some nausea, no diarrhea. The pain is worse when she coughs, which has been increasing due to rhinosinusitis. She is drinking lots of fluids.    Past Medical History:  Diagnosis Date  . Diabetes mellitus without complication (HCC)   . Hypertension   . Thyroid disease    . Uncontrolled diabetes mellitus type 2 without complications 07/26/2013   Review of Systems:   ROS negative except as noted in HPI.    Assessment & Plan:   See Encounters Tab for problem based charting.  Patient discussed with Dr. Heide Spark

## 2020-01-04 NOTE — Progress Notes (Signed)
Internal Medicine Clinic Attending  Case discussed with Dr. Seawell  At the time of the visit.  We reviewed the resident's history and exam and pertinent patient test results.  I agree with the assessment, diagnosis, and plan of care documented in the resident's note.  

## 2020-01-11 NOTE — Progress Notes (Signed)
Internal Medicine Clinic Attending  Case discussed with Dr. Seawell  At the time of the visit.  We reviewed the resident's history and exam and pertinent patient test results.  I agree with the assessment, diagnosis, and plan of care documented in the resident's note.  

## 2020-01-15 ENCOUNTER — Telehealth: Payer: Self-pay

## 2020-01-15 DIAGNOSIS — E1165 Type 2 diabetes mellitus with hyperglycemia: Secondary | ICD-10-CM

## 2020-01-15 DIAGNOSIS — Z794 Long term (current) use of insulin: Secondary | ICD-10-CM

## 2020-01-15 MED ORDER — INSULIN PEN NEEDLE 32G X 4 MM MISC: 1 refills | Status: AC

## 2020-01-15 MED ORDER — TRESIBA FLEXTOUCH 200 UNIT/ML ~~LOC~~ SOPN: 50.0000 [IU] | PEN_INJECTOR | Freq: Every day | SUBCUTANEOUS | 0 refills | Status: AC

## 2020-01-15 NOTE — Telephone Encounter (Signed)
vm left for patient to callback. Patient Katie Cook and Novofine needles samples are ready for pickup. Samples placed in fridge.

## 2020-01-28 ENCOUNTER — Other Ambulatory Visit: Payer: Self-pay | Admitting: Internal Medicine

## 2020-01-28 DIAGNOSIS — I10 Essential (primary) hypertension: Secondary | ICD-10-CM

## 2020-01-30 ENCOUNTER — Telehealth: Payer: Self-pay | Admitting: *Deleted

## 2020-01-30 NOTE — Telephone Encounter (Signed)
Notified pt daugther that pt medication and needles are ready to be pick up.

## 2020-01-31 NOTE — Telephone Encounter (Signed)
Katie Cook, daughter, picked up at office.

## 2020-02-14 ENCOUNTER — Encounter: Payer: Self-pay | Admitting: Internal Medicine

## 2020-02-15 ENCOUNTER — Encounter: Payer: Self-pay | Admitting: Internal Medicine

## 2020-02-15 ENCOUNTER — Other Ambulatory Visit: Payer: Self-pay

## 2020-02-15 ENCOUNTER — Ambulatory Visit (INDEPENDENT_AMBULATORY_CARE_PROVIDER_SITE_OTHER): Payer: Medicare Other | Admitting: Internal Medicine

## 2020-02-15 VITALS — BP 142/80 | HR 76 | Ht 61.0 in | Wt 240.2 lb

## 2020-02-15 DIAGNOSIS — E1165 Type 2 diabetes mellitus with hyperglycemia: Secondary | ICD-10-CM

## 2020-02-15 DIAGNOSIS — B372 Candidiasis of skin and nail: Secondary | ICD-10-CM | POA: Diagnosis not present

## 2020-02-15 DIAGNOSIS — E669 Obesity, unspecified: Secondary | ICD-10-CM | POA: Diagnosis not present

## 2020-02-15 DIAGNOSIS — Z794 Long term (current) use of insulin: Secondary | ICD-10-CM | POA: Diagnosis not present

## 2020-02-15 DIAGNOSIS — E890 Postprocedural hypothyroidism: Secondary | ICD-10-CM

## 2020-02-15 LAB — T4, FREE: Free T4: 1.16 ng/dL (ref 0.60–1.60)

## 2020-02-15 LAB — POCT GLYCOSYLATED HEMOGLOBIN (HGB A1C): Hemoglobin A1C: 7.6 % — AB (ref 4.0–5.6)

## 2020-02-15 LAB — TSH: TSH: 1.34 u[IU]/mL (ref 0.35–4.50)

## 2020-02-15 MED ORDER — NYSTATIN 100000 UNIT/GM EX CREA
1.0000 | TOPICAL_CREAM | Freq: Two times a day (BID) | CUTANEOUS | 1 refills | Status: DC
Start: 2020-02-15 — End: 2020-12-01

## 2020-02-15 MED ORDER — GERHARDT'S BUTT CREAM: 1.0000 "application " | TOPICAL_CREAM | Freq: Two times a day (BID) | CUTANEOUS | 0 refills | Status: DC

## 2020-02-15 NOTE — Patient Instructions (Addendum)
Please continue: - Tresiba U200 50 units day - Humalog 30 units in am and 35-40 before dinner - Trulicity 1.5 mg weekly  Please continue levothyroxine 150 mcg daily.  Take the thyroid hormone every day, with water, at least 30 minutes before breakfast, separated by at least 4 hours from: - acid reflux medications - calcium - iron - multivitamins  Please stop at the lab.  Please return in 3-4 months with your sugar log.

## 2020-02-15 NOTE — Progress Notes (Signed)
Patient ID: Katie Cook, female   DOB: 10-Mar-1956, 64 y.o.   MRN: [id number]  This visit occurred during the SARS-CoV-2 public health emergency.  Safety protocols were in place, including screening questions prior to the visit, additional usage of staff PPE, and extensive cleaning of exam room while observing appropriate contact time as indicated for disinfecting solutions.   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 Last visit 6 months ago.  Hypothyroidism: Reviewed history: - she had MNG >> developed compression sxs - Thyroidectomy 2011-2012 - hypothyroidism >> Levothyroxine, then Armour, then levothyroxine, then Nature-Throid, then NP thyroid, but not levothyroxine due to price  Pt is on levothyroxine 150 mcg daily (dose decreased 10/2019 by PCP), taken: - in am - fasting - at least 30 min from b'fast - no calcium - no iron - + multivitamins later in the day - + PPIs later in the day - not on Biotin  Reviewed her TFTs: Lab Results  Component Value Date   TSH 0.339 (L) 11/01/2019   TSH 0.85 08/14/2019   TSH 0.48 04/20/2018   TSH 0.04 (L) 12/26/2017   TSH 5.17 (H) 06/01/2017   FREET4 1.43 08/14/2019   FREET4 1.51 04/20/2018   FREET4 1.38 12/26/2017   FREET4 0.64 06/01/2017   FREET4 0.71 12/30/2016  04/2013: TSH 0.4 (at the [facility])  Pt denies: - feeling nodules in neck - hoarseness - dysphagia - choking - SOB with lying down  DM2: - h/o GDM during pregnancy and type 2 diabetes in 2005  Latest HbA1c: Lab Results  Component Value Date   HGBA1C 9.1 (A) 08/14/2019   HGBA1C 8.6 (A) 04/20/2018   HGBA1C 8.3 (A) 12/26/2017  ~8% 04/2013.  Pt is on a regimen of: - Tresiba U200 50 >> 54 >> 60 >> 50 units day. - Humalog 25-30 >> 30 units in am and 35  units before dinner >> Trulicity 1.5 mg weekly-restarted 01/2020 - occasional nausea Could not afford: Januvia 100 mg daily, Tradjenta 5 mg in am She was Invokana 100 mg - added 01/2014 >> initially stopped for diarrhea >> restarted 06/2014 >> but N/D >> restarted 10/2014 >> yeast inf >> stopped She stopped  Metformin XR 500 mg at lunchtime and 1000 mg at dinner - started 09/2013 >> nausea, diarrhea. She stopped Victoza >> nausea, 3 years. She tried Actos. She tried regular Metformin >> nausea. She tried Prandin.  Pt checks her sugars twice a day: - am:  91-205 >> 70, 190-220 >> 99-221, 306 >> 87-157, 187, 258 - before lunch: 76-209 >> 200s >> 52, 158-260, 320 >> n/c - 2h after lunch: 133, 198-220 >> n/c >> 352, 473 >> 170 - before dinner: 174-322 >> 200s >> 139, 195, 334 >> n/c - 2h after dinner: 170-250s >> 279, 419 >> n/c >> 392 >> 87, 113, 220 (may take 10 units of Humalog), 418 - bedtime: n/c >> 89, 101-200, 217 >> n/c - nighttime:  69, 190-404, 531 >> up to 250 >> 176-233, 343 Lowest sugar was 76 >>70 >> 52 >> 70; she has hypoglycemia awareness in the 70s. Highest sugar was 531 >> 250 >> 473 >> 418 x1.  Meter: AccuChek Aviva.  Pt's meals: - Breakfast: yoghurt + fruit - Lunch: salad with [place] - Dinner: chicken, broccoli, meat - Snacks: 2-3: fruit, popcorn  -No CKD,  last BUN/creatinine:  Lab Results  Component Value Date   BUN 13 11/01/2019   CREATININE 0.46 (L) 11/01/2019  04/19/2018: Glucose 125, BUN/creatinine 15/0.55, EGFR 112, rest of the CMP normal 03/19/2014: 17/0.75 On Losartan. -+  H/o HL: Lab Results  Component Value Date   CHOL 161 08/14/2019   HDL 48.50 08/14/2019   LDLCALC 97 08/14/2019   TRIG 80.0 08/14/2019   CHOLHDL 3 08/14/2019  She is not on a statin. - last eye exam was in 2021: reportedly No DR, + cataract; Dr. Delman Cheadle. - no numbness and tingling in her feet.  Since last visit, she was started on eplerenone 50 mg daily.  ROS: Constitutional:  no weight gain/no weight loss, no fatigue, no subjective hyperthermia, no subjective hypothermia Eyes: no blurry vision, no xerophthalmia ENT: no sore throat, + see HPI Cardiovascular: no CP/no SOB/no palpitations/no leg swelling Respiratory: no cough/no SOB/no wheezing Gastrointestinal: no N/no V/no D/no C/no acid reflux Musculoskeletal: no muscle aches/no joint aches Skin: + Rash in abdominal pannus, no hair loss Neurological: no tremors/no numbness/no tingling/no dizziness  I reviewed pt's medications, allergies, PMH, social hx, family hx, and changes were documented in the history of present illness. Otherwise, unchanged from my initial visit note.  Past Medical History:  Diagnosis Date  . Diabetes mellitus without complication ([place])   . Hypertension   . Thyroid disease   . Uncontrolled diabetes mellitus type 2 without complications 0/64/0160   Past Surgical History:  Procedure Laterality Date  . CESAREAN SECTION    . THYROID SURGERY     Social History   Socioeconomic History  . Marital status: Legally Separated    Spouse name: Not on file  . Number of children: 3  . Years of education: Some college  . Highest education level: Some college, no degree  Occupational History  . Not on file  Tobacco Use  . Smoking status: Never Smoker  . Smokeless tobacco: Never Used  Vaping Use  . Vaping Use: Never used  Substance and Sexual Activity  . Alcohol use: No    Alcohol/week: 0.0 standard drinks  . Drug use: No  . Sexual activity: Not Currently  Other Topics Concern  . Not on file  Social History Narrative   Lives alone   Caffeine use: Decaf tea. Soda rarely        Social Determinants of Radio broadcast assistant Strain:   . Difficulty of Paying Living Expenses: Not on file  Food Insecurity:   . Worried About Charity fundraiser in the Last Year: Not on file  . Ran Out of Food in the Last Year: Not on file  Transportation Needs:   . Lack of Transportation  (Medical): Not on file  . Lack of Transportation (Non-Medical): Not on file  Physical Activity:   . Days of Exercise per Week: Not on file  . Minutes of Exercise per Session: Not on file  Stress:   . Feeling of Stress : Not on file  Social Connections:   . Frequency of Communication with Friends and Family: Not on file  . Frequency of Social Gatherings with Friends and Family: Not on file  . Attends Religious Services: Not on file  . Active Member of Clubs or Organizations: Not on file  . Attends Archivist Meetings: Not on file  . Marital Status: Not on file  Intimate Partner Violence:   . Fear of Current or Ex-Partner: Not on file  . Emotionally Abused: Not on  file  . Physically Abused: Not on file  . Sexually Abused: Not on file   Current Outpatient Medications on File Prior to Visit  Medication Sig Dispense Refill  . albuterol (PROAIR HFA) 108 (90 Base) MCG/ACT inhaler Inhale 2 puffs into the lungs every 6 (six) hours as needed for wheezing or shortness of breath. 8 g 1  . beclomethasone (QVAR) 40 MCG/ACT inhaler Inhale 2 puffs into the lungs 2 (two) times daily. 1 Inhaler 0  . carvedilol (COREG) 25 MG tablet Take 1 tablet (25 mg total) by mouth 2 (two) times daily with a meal. 90 tablet 0  . Dulaglutide (TRULICITY) 1.5 MV/7.8IO SOPN Inject 1.5 mg into the skin once a week. 4 pen 11  . eplerenone (INSPRA) 50 MG tablet Take 1 tablet (50 mg total) by mouth daily. 30 tablet 0  . glucose blood (ONETOUCH VERIO) test strip USE TO TEST BLOOD SUGAR 3  TO 4 TIMES DAILY AS  INSTRUCTED 400 each 1  . hydrochlorothiazide (HYDRODIURIL) 25 MG tablet Take 1 tablet (25 mg total) by mouth daily. 60 tablet 1  . insulin degludec (TRESIBA FLEXTOUCH) 200 UNIT/ML FlexTouch Pen Inject 50 Units into the skin daily. 3 mL 0  . Insulin Lispro (HUMALOG KWIKPEN) 200 UNIT/ML SOPN Inject 25-30 Units into the skin 3 (three) times daily before meals. 30 pen 3  . Insulin Pen Needle 32G X 4 MM MISC Use 4  times a day with insulin pen. 400 each 1  . Lancets (ONETOUCH ULTRASOFT) lancets Use to test blood sugar 3 times daily as instructed. Dx: E11.65 300 each 3  . levocetirizine (XYZAL) 5 MG tablet Take 1 tablet (5 mg total) by mouth every evening. 30 tablet 5  . levothyroxine (EUTHYROX) 150 MCG tablet Take 1 tablet (150 mcg total) by mouth daily before breakfast. 90 tablet 3  . losartan (COZAAR) 100 MG tablet Take 1 tablet by mouth once daily 60 tablet 1  . mometasone (NASONEX) 50 MCG/ACT nasal spray Place 2 sprays into the nose daily. 1 each 2  . nystatin cream (MYCOSTATIN) Apply 1 application topically 2 (two) times daily as needed for dry skin. Ok to provide two tubes of 30 grs. 60 g 0  . omeprazole (PRILOSEC) 20 MG capsule Take 20 mg by mouth 2 (two) times daily.    Marland Kitchen triamcinolone ointment (KENALOG) 0.5 % Apply 1 application topically 2 (two) times daily. 15 g 1   No current facility-administered medications on file prior to visit.   Allergies  Allergen Reactions  . Metformin And Related Diarrhea    and dizziness  . Amlodipine Swelling  . Ceclor [Cefaclor] Rash  . Sulfa Antibiotics Rash   Family History  Problem Relation Age of Onset  . Ovarian cancer Mother   . Cancer Mother   . Cancer Father        pancreatic   . Stroke Father   . Diabetes Sister   . Cancer Maternal Aunt        breast cancer   . Diabetes Maternal Grandmother   . Cancer Maternal Aunt        thyroid   . Hypertension Sister   . Stroke Paternal Uncle     PE: BP (!) 142/80   Pulse 76   Ht 5' 1"  (1.549 m)   Wt 240 lb 3.2 oz (109 kg)   SpO2 98%   BMI 45.39 kg/m  Body mass index is 45.39 kg/m.  Wt Readings from Last 3 Encounters:  02/15/20  240 lb 3.2 oz (109 kg)  12/28/19 240 lb (108.9 kg)  11/01/19 241 lb 12.8 oz (109.7 kg)   Constitutional: overweight, in NAD Eyes: PERRLA, EOMI, no exophthalmos ENT: moist mucous membranes, no thyromegaly, no cervical lymphadenopathy Cardiovascular: RRR, No  MRG Respiratory: CTA B Gastrointestinal: abdomen soft, NT, ND, BS+ Musculoskeletal: no deformities, strength intact in all 4 Skin: moist, warm, + abdominal ulcer site healed, however, she does have erythema in the abdominal pannus. No skin breakage. Neurological: no tremor with outstretched hands, DTR normal in all 4  ASSESSMENT: 1. DM2, insulin-dependent, uncontrolled, without long term complications, but with hyperglycemia - I suggested Invokana in the past, to also help with her high BP and also her obesity, but she did not start afraid of side effects.  - she thinks Engineer, agricultural not helping much >> changed to [place]. This would be preferred also b/c of her tendency for low CBGs, which Tyler Aas may reduce.  2. Hypothyroidism - postsurgical - thyroidectomy 2011 - h/o benign MNG  3. Obesity class III  4. Abdominal pannus candidiasis  5.  Abdominal ulcer - resolved  PLAN:  1. Patient with longstanding uncontrolled, type 2 diabetes, on basal-bolus insulin regimen and weekly GLP-1 receptor agonist.  She continues to get patient assistance from [name] and her sugars started to improve after she joined this program.  At last visit, sugars are fluctuating, usually lower in the morning and increasing throughout the day.  However, she was off Trulicity at that time and I sent a prescription for this to her pharmacy, per her request. HbA1c was 9.1% at that time.  -At last visit she had a large abdominal skin ulcer, for which we started doxycycline and she was also to plastic surgery/wound care.  This has healed. -At today's visit, sugars appear to be improved, with the exception of the last few days, in which sugars were in the 200s and even 1 at in the 400s. However, she mentions that she did see improvement in her blood sugars after starting Trulicity she would like to continue with it although she does have some nausea occasionally with it. However, because of this, we cannot decrease the  dose, although I would have been tempted to suggest this today. She does not have low blood sugars but some of the sugars are at goal, especially in the morning. Upon questioning, she occasionally corrects with Humalog high blood sugars at bedtime, but she uses approximately 10 units. She is not dropping her sugars too low afterwards. This does not happen too often however, so I feel that the low blood sugars in the morning are mainly due to [place], rather than Humalog at that time. Therefore, we will not increase the [place] dose. Since the sugars may be higher around 10-11 PM, we did discuss that she occasionally may need a higher dose of Humalog with a larger dinner. Otherwise, I did not suggest any changes in her regimen for now. - I advised her to:  Patient Instructions  Please continue: - Tresiba U200 50 units day - Humalog 35 units 2x a day - Trulicity 1.5 mg weekly  Please continue levothyroxine 150 mcg daily.  Take the thyroid hormone every day, with water, at least 30 minutes before breakfast, separated by at least 4 hours from: - acid reflux medications - calcium - iron - multivitamins  Please stop at the lab.  Please return in 3-4 months with your sugar log.   - we checked her HbA1c: 7.6% (higher) -  advised to check sugars at different times of the day - 3x a day, rotating check times - advised for yearly eye exams >> she is UTD - return to clinic in 3-4 months    2. Hypothyroidism - Uncontrolled, likely due to incomplete compliance - latest thyroid labs reviewed with pt >> TSH is suppressed: Lab Results  Component Value Date   TSH 0.339 (L) 11/01/2019   - she continues on LT4 150 mcg daily (dose decreased by PCP in 10/2019). - pt feels good on this dose. - we discussed about taking the thyroid hormone every day, with water, >30 minutes before breakfast, separated by >4 hours from acid reflux medications, calcium, iron, multivitamins. Pt. is taking it correctly. - will  check thyroid tests today: TSH and fT4 - If labs are abnormal, she will need to return for repeat TFTs in 1.5 months  3. Obesity class III -Continue GLP-1 receptor agonist, which should also help with weight loss -At last visit, she had no significant weight loss since the previous visit -She maintains her weight since last visit  4. Abdominal pannus candidiasis -I refilled her prescription for nystatin powder and she also wanted me to send a prescription for nystatin cream -I did suggest that she may need to see dermatology since she does not feel that the nystatin powder is powerful enough for her.   Component     Latest Ref Rng & Units 02/15/2020  TSH     0.35 - 4.50 uIU/mL 1.34  T4,Free(Direct)     0.60 - 1.60 ng/dL 1.16  Normal TFTs.  Philemon Kingdom, MD PhD [facility] Endocrinology

## 2020-02-27 ENCOUNTER — Telehealth: Payer: Self-pay | Admitting: Internal Medicine

## 2020-02-27 NOTE — Telephone Encounter (Signed)
Ok to send

## 2020-02-27 NOTE — Telephone Encounter (Signed)
Patient called to request a new RX with the increased dosage for Trulicity (patient states she discussed increase with Dr. Elvera Lennox) be sent to [facility] Fax# [phone]

## 2020-02-27 NOTE — Telephone Encounter (Signed)
Ok to send the higher dose, 3 mg weekly.  However, this should be carefully documented in her paperwork.  I think [name] had the forms, which patient left at the time of her visit... It may need to wait until she comes back.  I am attaching her to this message.

## 2020-02-28 ENCOUNTER — Other Ambulatory Visit: Payer: Self-pay | Admitting: *Deleted

## 2020-03-04 ENCOUNTER — Encounter: Payer: Self-pay | Admitting: Student

## 2020-03-04 ENCOUNTER — Ambulatory Visit (INDEPENDENT_AMBULATORY_CARE_PROVIDER_SITE_OTHER): Payer: Medicare Other | Admitting: Student

## 2020-03-04 ENCOUNTER — Other Ambulatory Visit: Payer: Self-pay

## 2020-03-04 ENCOUNTER — Ambulatory Visit (HOSPITAL_COMMUNITY)
Admission: RE | Admit: 2020-03-04 | Discharge: 2020-03-04 | Disposition: A | Discharge status: Home / Self Care | Payer: Medicare Other | Source: Ambulatory Visit | Attending: Family Medicine | Admitting: Family Medicine

## 2020-03-04 VITALS — BP 146/73 | HR 79 | Temp 98.1°F | Ht 61.0 in | Wt 245.3 lb

## 2020-03-04 DIAGNOSIS — R0602 Shortness of breath: Secondary | ICD-10-CM | POA: Diagnosis not present

## 2020-03-04 DIAGNOSIS — R200 Anesthesia of skin: Secondary | ICD-10-CM | POA: Insufficient documentation

## 2020-03-04 DIAGNOSIS — I10 Essential (primary) hypertension: Secondary | ICD-10-CM | POA: Diagnosis not present

## 2020-03-04 DIAGNOSIS — R202 Paresthesia of skin: Secondary | ICD-10-CM | POA: Diagnosis not present

## 2020-03-04 MED ORDER — TRULICITY 3 MG/0.5ML ~~LOC~~ SOAJ: 3.0000 mg | SUBCUTANEOUS | 3 refills | Status: DC

## 2020-03-04 MED ORDER — CARVEDILOL 25 MG PO TABS: 25.0000 mg | ORAL_TABLET | Freq: Two times a day (BID) | ORAL | 0 refills | Status: DC

## 2020-03-04 MED ORDER — SPIRONOLACTONE 25 MG PO TABS: 25.0000 mg | ORAL_TABLET | Freq: Every day | ORAL | 11 refills | Status: DC

## 2020-03-04 NOTE — Patient Instructions (Signed)
Thank you, [name] for allowing [place] to provide your care today. Today we discussed  1.) High blood pressure - please continue to check your blood pressure at home. We will be restarting you on sprionolactone. Please follow up in three weeks for [place] to check your blood pressure and labs. If you have any side effects please call our office  2.) Numbness - We will be checking blood work for vitamin b12 deficiency and complete blood count levels.   3.) Shortness of breath - We will be performing an EKG today. If you have worsening episodes of shortness of breath with chest pain or tightness or episodes of sweating or nausea, please call 911.   I have ordered the following labs for you:   Lab Orders     Vitamin B12     CBC no Diff   Tests ordered today:  EKG  Referrals ordered today:   Referral Orders  No referral(s) requested today     I have ordered the following medication/changed the following medications:   Stop the following medications: Medications Discontinued During This Encounter  Medication Reason  . eplerenone (INSPRA) 50 MG tablet Change in therapy     Start the following medications: Meds ordered this encounter  Medications  . spironolactone (ALDACTONE) 25 MG tablet    Sig: Take 1 tablet (25 mg total) by mouth daily.    Dispense:  30 tablet    Refill:  11     Follow up: 3 weeks     Should you have any questions or concerns please call the internal medicine clinic at [phone].     Thalia Bloodgood, D.O. [facility]

## 2020-03-04 NOTE — Progress Notes (Signed)
CC: high blood pressure, extremity numbness, shortness of breath  HPI:  Katie Cook is a 64 y.o. female with a past medical history stated below and presents today for high blood pressure, extremity numbness, shortness of breath. Please see problem based assessment and plan for additional details.  Past Medical History:  Diagnosis Date  . Diabetes mellitus without complication ([facility])   . Hypertension   . Thyroid disease   . Uncontrolled diabetes mellitus type 2 without complications 07/26/2013    Current Outpatient Medications on File Prior to Visit  Medication Sig Dispense Refill  . albuterol (PROAIR HFA) 108 (90 Base) MCG/ACT inhaler Inhale 2 puffs into the lungs every 6 (six) hours as needed for wheezing or shortness of breath. 8 g 1  . beclomethasone (QVAR) 40 MCG/ACT inhaler Inhale 2 puffs into the lungs 2 (two) times daily. 1 Inhaler 0  . Dulaglutide (TRULICITY) 3 MG/0.5ML SOPN Inject 3 mg as directed once a week. 2.5 mL 3  . glucose blood (ONETOUCH VERIO) test strip USE TO TEST BLOOD SUGAR 3  TO 4 TIMES DAILY AS  INSTRUCTED 400 each 1  . hydrochlorothiazide (HYDRODIURIL) 25 MG tablet Take 1 tablet (25 mg total) by mouth daily. 60 tablet 1  . insulin degludec (TRESIBA FLEXTOUCH) 200 UNIT/ML FlexTouch Pen Inject 50 Units into the skin daily. 3 mL 0  . Insulin Lispro (HUMALOG KWIKPEN) 200 UNIT/ML SOPN Inject 25-30 Units into the skin 3 (three) times daily before meals. 30 pen 3  . Insulin Pen Needle 32G X 4 MM MISC Use 4 times a day with insulin pen. 400 each 1  . Lancets (ONETOUCH ULTRASOFT) lancets Use to test blood sugar 3 times daily as instructed. Dx: E11.65 300 each 3  . levocetirizine (XYZAL) 5 MG tablet Take 1 tablet (5 mg total) by mouth every evening. 30 tablet 5  . levothyroxine (EUTHYROX) 150 MCG tablet Take 1 tablet (150 mcg total) by mouth daily before breakfast. 90 tablet 3  . losartan (COZAAR) 100 MG tablet Take 1 tablet by mouth once daily 60 tablet 1  .  mometasone (NASONEX) 50 MCG/ACT nasal spray Place 2 sprays into the nose daily. 1 each 2  . Nystatin (GERHARDT'S BUTT CREAM) CREA Apply 1 application topically 2 (two) times daily. 1 each 0  . nystatin cream (MYCOSTATIN) Apply 1 application topically 2 (two) times daily. Ok to provide two tubes of 30 grs. 60 g 1  . omeprazole (PRILOSEC) 20 MG capsule Take 20 mg by mouth 2 (two) times daily.    Marland Kitchen triamcinolone ointment (KENALOG) 0.5 % Apply 1 application topically 2 (two) times daily. 15 g 1   No current facility-administered medications on file prior to visit.    Family History  Problem Relation Age of Onset  . Ovarian cancer Mother   . Cancer Mother   . Cancer Father        pancreatic   . Stroke Father   . Diabetes Sister   . Cancer Maternal Aunt        breast cancer   . Diabetes Maternal Grandmother   . Cancer Maternal Aunt        thyroid   . Hypertension Sister   . Stroke Paternal Uncle     Social History   Socioeconomic History  . Marital status: Legally Separated    Spouse name: Not on file  . Number of children: 3  . Years of education: Some college  . Highest education level: Some college, no  degree  Occupational History  . Not on file  Tobacco Use  . Smoking status: Never Smoker  . Smokeless tobacco: Never Used  Vaping Use  . Vaping Use: Never used  Substance and Sexual Activity  . Alcohol use: No    Alcohol/week: 0.0 standard drinks  . Drug use: No  . Sexual activity: Not Currently  Other Topics Concern  . Not on file  Social History Narrative   Lives alone   Caffeine use: Decaf tea. Soda rarely        Social Determinants of Corporate investment banker Strain:   . Difficulty of Paying Living Expenses: Not on file  Food Insecurity:   . Worried About Programme researcher, broadcasting/film/video in the Last Year: Not on file  . Ran Out of Food in the Last Year: Not on file  Transportation Needs:   . Lack of Transportation (Medical): Not on file  . Lack of Transportation  (Non-Medical): Not on file  Physical Activity:   . Days of Exercise per Week: Not on file  . Minutes of Exercise per Session: Not on file  Stress:   . Feeling of Stress : Not on file  Social Connections:   . Frequency of Communication with Friends and Family: Not on file  . Frequency of Social Gatherings with Friends and Family: Not on file  . Attends Religious Services: Not on file  . Active Member of Clubs or Organizations: Not on file  . Attends Banker Meetings: Not on file  . Marital Status: Not on file  Intimate Partner Violence:   . Fear of Current or Ex-Partner: Not on file  . Emotionally Abused: Not on file  . Physically Abused: Not on file  . Sexually Abused: Not on file    Review of Systems: ROS negative except for what is noted on the assessment and plan.  Vitals:   03/04/20 1457 03/04/20 1506  BP: (!) 154/72 (!) 146/73  Pulse: 82 79  Temp: 98.1 F (36.7 C)   TempSrc: Oral   SpO2: 97%   Weight: 245 lb 4.8 oz (111.3 kg)   Height: 5\' 1"  (1.549 m)      Physical Exam: Physical Exam Vitals and nursing note reviewed.  Constitutional:      General: She is not in acute distress.    Appearance: Normal appearance. She is not ill-appearing, toxic-appearing or diaphoretic.  HENT:     Head: Normocephalic and atraumatic.  Cardiovascular:     Rate and Rhythm: Normal rate and regular rhythm.     Pulses: Normal pulses.     Heart sounds: Normal heart sounds. No murmur heard.  No friction rub. No gallop.   Pulmonary:     Effort: Pulmonary effort is normal. No respiratory distress.     Breath sounds: Normal breath sounds. No stridor. No wheezing, rhonchi or rales.  Skin:    General: Skin is warm and dry.  Neurological:     General: No focal deficit present.     Mental Status: She is alert and oriented to person, place, and time. Mental status is at baseline.  Psychiatric:        Mood and Affect: Mood normal.        Behavior: Behavior normal.       Assessment & Plan:   See Encounters Tab for problem based charting.  Patient seen with Dr. , D.O. [facility], PGY-1 Pager: [phone], Phone: [phone] Date 03/04/2020 Time 4:47  PM

## 2020-03-04 NOTE — Assessment & Plan Note (Addendum)
Assessment: Patient endorses episodes of shortness of breath with exertion such as walking up stairs or up a hill. She states these have been occurring the past few months. Denies shortness of breath when walking on flat ground. Denies episodes of chest pain or tightness. Does endorse one episode of coughing fit with shortness of breath.   EKG performed while patient was in the clinic. Rate of 69 normal sinus rhythm. Normal axis. Normal PR and QT intervals. No ST wave abnormalities. Unremarkable EKG. No prior for comparison.   Differential includes cardiac etiology such as ACS or heart failure, asthma, deconditioning. Patient without chest pain/tightness during episodes. EKG unremarkable. Also patient endorses having episodes of shortness of breath in the past with negative EKG's. Shortness of breath not occurring at rest, only with increased exertion. Informed patient of signs to look out for, worsening chest pain, diaphoresis, nausea. She notes daily episodes of diaphoresis and nausea, but will alert for these symptoms occurring with chest pain, worsening, or changing. No lower extremity edema present on evaluation. Do not suspect cardiac etiology at this time.  Patient without coughing episodes and lung examination is unremarkable. Low suspicion that this is due to her asthma at this time. She was unable to afford betamethasone inhaler, but still has albuterol inhaler at home.   Patient with 5 lb weight gain in past 3 weeks as well as endorses decrease in amount of physical activity. She notes increase in appetite since starting trulicity. Suspect that deconditioning is cause of shortness of breath.   Plan: - Encouraged increase in physical activity and improving diet.  - Patient given instructions to call clinic or 911 if she has episodes of shortness of breath with chest pain or episodes at rest. Also instructed to call if she has episodes with associated diaphoresis or nausea that are outside of  her norm.  - If patient continues to have episodes of shortness of breath, consider ordering PFT's, echocardiogram.

## 2020-03-04 NOTE — Assessment & Plan Note (Addendum)
Assessment: Patient states she has had numbness in between her 1st and 2nd toes for the past three weeks. She describes the numbness as when "your foot falls asleep." She notes numbness of her hands and forearms bilaterally for the past 3-4 months. She states they resolve on their own and numbness improves with rubbing of the area. Denies episodes lasting longer than an hour. The only pattern she has noticed is that numbness seems to occur more often in the mornings.   At this time suspect this may be early signs of diabetic neuropathy, however, will check CBC and B12 to rule out reversible causes of extremity numbness. Do not believe EMG is appropriate at this time.   Plan: - CBC and B12 levels ordered - Continue diabetic management per endocrinology

## 2020-03-04 NOTE — Telephone Encounter (Signed)
Increase dose sent to fax number provided.

## 2020-03-04 NOTE — Assessment & Plan Note (Addendum)
Assessment: Patient presents with follow up for blood pressure. BP today of 146/73. Past three BP's in the office 140's systolically. Regimen of carvedilol 25 mg BID, HCTZ 25 mg daily, and losartan 100 mg daily.  She has reported no episodes of lightheadedness or dizziness. In the past she was on amlodipine and spironolactone, however, she had episodes of lower extremity swelling. So she was taken off both medications at the same time. I do not believe the swelling was caused by the amlodipine and that should could benefit from additional medication. She was unable to afford eplerenone.   Plan: - Continue regimen of carvedilol 25 mg BID, HCTZ 25 mg daily, and losartan 100 mg daily - Will start spironolactone 25 mg - Will have patient return in 3 weeks for repeat blood pressure, discuss any side effects occurring, and BMP. If side effects occur sooner, patient instructed to call clinic

## 2020-03-05 LAB — CBC
Hematocrit: 40.2 % (ref 34.0–46.6)
Hemoglobin: 13.4 g/dL (ref 11.1–15.9)
MCH: 26.2 pg — ABNORMAL LOW (ref 26.6–33.0)
MCHC: 33.3 g/dL (ref 31.5–35.7)
MCV: 79 fL (ref 79–97)
Platelets: 312 10*3/uL (ref 150–450)
RBC: 5.11 x10E6/uL (ref 3.77–5.28)
RDW: 14.4 % (ref 11.7–15.4)
WBC: 13.9 10*3/uL — ABNORMAL HIGH (ref 3.4–10.8)

## 2020-03-05 LAB — VITAMIN B12: Vitamin B-12: 853 pg/mL (ref 232–1245)

## 2020-03-08 NOTE — Progress Notes (Signed)
Internal Medicine Clinic Attending  I saw and evaluated the patient.  I personally confirmed the key portions of the history and exam documented by Dr. Katsadouros and I reviewed pertinent patient test results.  The assessment, diagnosis, and plan were formulated together and I agree with the documentation in the resident's note.  

## 2020-03-12 ENCOUNTER — Other Ambulatory Visit: Payer: Self-pay

## 2020-03-12 DIAGNOSIS — I10 Essential (primary) hypertension: Secondary | ICD-10-CM

## 2020-03-13 ENCOUNTER — Telehealth: Payer: Self-pay | Admitting: *Deleted

## 2020-03-13 MED ORDER — HYDROCHLOROTHIAZIDE 25 MG PO TABS: 25.0000 mg | ORAL_TABLET | Freq: Every day | ORAL | 1 refills | Status: DC

## 2020-03-13 MED ORDER — LOSARTAN POTASSIUM 100 MG PO TABS: 100.0000 mg | ORAL_TABLET | Freq: Every day | ORAL | 5 refills | Status: DC

## 2020-03-13 NOTE — Telephone Encounter (Signed)
Pt states she thinks the spironolactone is causing problems She states she is tired Has h/a States she feels like her eyes "are tiny", cant focus but denies blurriness Denies vomiting but has nausea Stayed in bed 2 days Doesn't have energy to even wash dishes Denies chest pain, short of breath Feels like her balance is off Arms and legs are heavy and "a little numb" Drinking at least 64oz water daily Please advise Ph# [phone] Sending to yellow team and dr Mayford Knife

## 2020-03-13 NOTE — Telephone Encounter (Signed)
[facility] has no open appts tomorrow, she does have an appt with you 12/9- next thursday

## 2020-03-13 NOTE — Telephone Encounter (Signed)
That sounds good, as long as she doesn't worsen.  If so, she should come in earlier (I might even be precepting).

## 2020-03-13 NOTE — Telephone Encounter (Signed)
Please have her stop the spironolactone and schedule an appt for tomorrow if at all possible.  I haven't met her yet but briefly reviewed her most recent office note from last week.

## 2020-03-13 NOTE — Telephone Encounter (Signed)
Pt was called and states she feels a little better. She is cautioned that if she has chest pain, short of breath, weakness, vomiting, severe h/a, to call 911. If she remains stable please keep appt 12/9 with dr Mayford Knife. She is agreeable.

## 2020-03-17 ENCOUNTER — Ambulatory Visit (HOSPITAL_COMMUNITY)
Admission: RE | Admit: 2020-03-17 | Discharge: 2020-03-17 | Disposition: A | Discharge status: Home / Self Care | Payer: Medicare Other | Source: Ambulatory Visit | Attending: Internal Medicine | Admitting: Internal Medicine

## 2020-03-17 ENCOUNTER — Encounter: Payer: Self-pay | Admitting: Internal Medicine

## 2020-03-17 ENCOUNTER — Ambulatory Visit (INDEPENDENT_AMBULATORY_CARE_PROVIDER_SITE_OTHER): Payer: Medicare Other | Admitting: Internal Medicine

## 2020-03-17 VITALS — BP 150/83 | HR 75 | Temp 98.4°F | Wt 245.2 lb

## 2020-03-17 DIAGNOSIS — I10 Essential (primary) hypertension: Secondary | ICD-10-CM | POA: Insufficient documentation

## 2020-03-17 DIAGNOSIS — R0600 Dyspnea, unspecified: Secondary | ICD-10-CM | POA: Insufficient documentation

## 2020-03-17 DIAGNOSIS — R531 Weakness: Secondary | ICD-10-CM

## 2020-03-17 DIAGNOSIS — R5383 Other fatigue: Secondary | ICD-10-CM | POA: Diagnosis not present

## 2020-03-17 DIAGNOSIS — R5381 Other malaise: Secondary | ICD-10-CM | POA: Diagnosis not present

## 2020-03-17 DIAGNOSIS — R0609 Other forms of dyspnea: Secondary | ICD-10-CM | POA: Diagnosis not present

## 2020-03-17 DIAGNOSIS — M791 Myalgia, unspecified site: Secondary | ICD-10-CM

## 2020-03-17 LAB — BASIC METABOLIC PANEL
Anion gap: 10 (ref 5–15)
BUN: 11 mg/dL (ref 8–23)
CO2: 25 mmol/L (ref 22–32)
Calcium: 8.9 mg/dL (ref 8.9–10.3)
Chloride: 103 mmol/L (ref 98–111)
Creatinine, Ser: 0.51 mg/dL (ref 0.44–1.00)
GFR, Estimated: >60 mL/min (ref >60)
Glucose, Bld: 73 mg/dL (ref 70–99)
Potassium: 3.5 mmol/L (ref 3.5–5.1)
Sodium: 138 mmol/L (ref 135–145)

## 2020-03-17 NOTE — Patient Instructions (Addendum)
Do not take spironolactone. Please continue to drink water to hydrate. If your symptoms do not improve or worsen over the next couple of days, please call us for further instructions. If your symptoms do improve, please arrange a follow up visit in four weeks for further management of your blood pressure.

## 2020-03-18 NOTE — Assessment & Plan Note (Addendum)
This encounter was for evaluation for possible adverse medication effect. HPI: She notes exertional dyspnea, fatigue, weakness, general malaise, and muscle pain that developed about 24-36h after being started on spironolactone. She did try to tolerate the side effects for around 5d however, after there being no improvement, she discontinued them on Friday (3d prior to this visit).  See HPI for complete details.   Assessment: overall, given the time frame for the onset of symptoms, I suspect that they are attributable to starting spironolactone, especially now that sx are improving. Initially symptoms were concerning for orthostatic hypotension. Orthostatic vital signs were obtained today and were normal.  I was also concerned for a metabolic derangement that was causing her symptoms. I obtained a BMP which showed that her electrolytes were within normal limits. Her glucose was 76 for which she at some candy for, but otherwise her glucoses have been good.  Cardiac or neurologic etiology considered however I would find it strange that related symptoms would develop at the same time that spironolactone was started. I did obtain an EKG which was unrevealing. I also considered a pulmonary or infectious source to explain her dyspnea which she feels is worse today.  She has no cough and lungs are clear on exam. No systemic infectious symptoms. Plan -Spironolactone DISCONTINUED -Will see how sx progress over the next few days. If symptoms due not continue to improve, then I would consider obtaining a CXR and an echocardiogram for further evaluation.  -She will f/u in 1w.

## 2020-03-18 NOTE — Progress Notes (Signed)
Office Visit   Patient ID: Katie Cook, female    DOB: 1955-11-04, 64 y.o.   MRN: [id number]  Subjective:  CC: medication reaction  HPI 64 y.o. presents today for evaluation of possible adverse medication effect.  She was last seen in the office on 11/23 at which time her blood pressure was found to be elevated above goal. On chart review, it appeared that she has been running in the 140s systolically consistently over the prior OVs. It was noted that she had been started on spironolactone and amlodipine in the past however developed LE edema at which time both medications were discontinued. As it was thought that the swelling may have been attributable to the amlodipine, she was restarted on spironolactone at last OV.   Today's visit: She notes dyspnea, fatigue, weakness, general malaise, and muscle pain that developed about 24-36h after being started on spironolactone. She did try to tolerate the side effects for around 5d however, after there being no improvement, she discontinued them on Friday (3d prior to this visit).  She notes feeling more lightheaded upon sitting and standing up. She had been checking her blood pressure at home and notes that she was consistently hypertensive in the 160s SBP.  She is feeling a little better but still feels unwell in the sense of being fatigued.  She denies heart palpitations or chest pain. Muscle cramping has resolved mostly.     ACTIVE MEDICATIONS   Current Outpatient Medications on File Prior to Visit  Medication Sig Dispense Refill  . albuterol (PROAIR HFA) 108 (90 Base) MCG/ACT inhaler Inhale 2 puffs into the lungs every 6 (six) hours as needed for wheezing or shortness of breath. 8 g 1  . beclomethasone (QVAR) 40 MCG/ACT inhaler Inhale 2 puffs into the lungs 2 (two) times daily. 1 Inhaler 0  . carvedilol (COREG) 25 MG tablet Take 1 tablet (25 mg total) by mouth 2 (two) times daily with a meal. 90 tablet 0  . Dulaglutide (TRULICITY) 3  MG/0.5ML SOPN Inject 3 mg as directed once a week. 2.5 mL 3  . glucose blood (ONETOUCH VERIO) test strip USE TO TEST BLOOD SUGAR 3  TO 4 TIMES DAILY AS  INSTRUCTED 400 each 1  . hydrochlorothiazide (HYDRODIURIL) 25 MG tablet Take 1 tablet (25 mg total) by mouth daily. 60 tablet 1  . insulin degludec (TRESIBA FLEXTOUCH) 200 UNIT/ML FlexTouch Pen Inject 50 Units into the skin daily. 3 mL 0  . Insulin Lispro (HUMALOG KWIKPEN) 200 UNIT/ML SOPN Inject 25-30 Units into the skin 3 (three) times daily before meals. 30 pen 3  . Insulin Pen Needle 32G X 4 MM MISC Use 4 times a day with insulin pen. 400 each 1  . Lancets (ONETOUCH ULTRASOFT) lancets Use to test blood sugar 3 times daily as instructed. Dx: E11.65 300 each 3  . levocetirizine (XYZAL) 5 MG tablet Take 1 tablet (5 mg total) by mouth every evening. 30 tablet 5  . levothyroxine (EUTHYROX) 150 MCG tablet Take 1 tablet (150 mcg total) by mouth daily before breakfast. 90 tablet 3  . losartan (COZAAR) 100 MG tablet Take 1 tablet (100 mg total) by mouth daily. 90 tablet 5  . mometasone (NASONEX) 50 MCG/ACT nasal spray Place 2 sprays into the nose daily. 1 each 2  . Nystatin (GERHARDT'S BUTT CREAM) CREA Apply 1 application topically 2 (two) times daily. 1 each 0  . nystatin cream (MYCOSTATIN) Apply 1 application topically 2 (two) times daily. Ok to provide  two tubes of 30 grs. 60 g 1  . omeprazole (PRILOSEC) 20 MG capsule Take 20 mg by mouth 2 (two) times daily.    Marland Kitchen triamcinolone ointment (KENALOG) 0.5 % Apply 1 application topically 2 (two) times daily. 15 g 1   No current facility-administered medications on file prior to visit.    ROS  Review of Systems  Constitutional: Positive for fatigue. Negative for chills and fever.  Respiratory: Negative for shortness of breath.   Cardiovascular: Negative for chest pain, palpitations and leg swelling.  Gastrointestinal: Negative for diarrhea, nausea and vomiting.  Musculoskeletal: Positive for myalgias.   Neurological: Positive for dizziness, weakness, light-headedness and headaches. Negative for syncope.    Objective:   BP (!) 150/83 (BP Location: Left Arm, Patient Position: Sitting, Cuff Size: Normal)   Pulse 75   Temp 98.4 F (36.9 C) (Oral)   Wt 245 lb 3.2 oz (111.2 kg)   SpO2 99%   BMI 46.33 kg/m  Wt Readings from Last 3 Encounters:  03/17/20 245 lb 3.2 oz (111.2 kg)  03/04/20 245 lb 4.8 oz (111.3 kg)  02/15/20 240 lb 3.2 oz (109 kg)   BP Readings from Last 3 Encounters:  03/17/20 (!) 150/83  03/04/20 (!) 146/73  02/15/20 (!) 142/80   Physical Exam Constitutional:      General: She is not in acute distress.    Appearance: Normal appearance. She is not toxic-appearing.  Cardiovascular:     Rate and Rhythm: Normal rate and regular rhythm.  Pulmonary:     Effort: Pulmonary effort is normal.     Breath sounds: Normal breath sounds.  Musculoskeletal:        General: No tenderness.     Right lower leg: No edema.     Left lower leg: No edema.  Skin:    General: Skin is warm and dry.  Neurological:     General: No focal deficit present.     Mental Status: She is oriented to person, place, and time.     Health Maintenance:   Health Maintenance  Topic Date Due  . Hepatitis C Screening  Never done  . COVID-19 Vaccine (1) Never done  . HIV Screening  Never done  . TETANUS/TDAP  Never done  . COLONOSCOPY  Never done  . PAP SMEAR-Modifier  02/23/2016  . INFLUENZA VACCINE  07/10/2020 (Originally 11/11/2019)  . HEMOGLOBIN A1C  08/14/2020  . OPHTHALMOLOGY EXAM  10/08/2020  . FOOT EXAM  10/10/2020  . MAMMOGRAM  10/30/2021     Assessment & Plan:   Problem List Items Addressed This Visit      Cardiovascular and Mediastinum   Hypertension - Primary    This encounter was for evaluation for possible adverse medication effect. HPI: She notes exertional dyspnea, fatigue, weakness, general malaise, and muscle pain that developed about 24-36h after being started on  spironolactone. She did try to tolerate the side effects for around 5d however, after there being no improvement, she discontinued them on Friday (3d prior to this visit).  See HPI for complete details.   Assessment: overall, given the time frame for the onset of symptoms, I suspect that they are attributable to starting spironolactone, especially now that sx are improving. Initially symptoms were concerning for orthostatic hypotension. Orthostatic vital signs were obtained today and were normal.  I was also concerned for a metabolic derangement that was causing her symptoms. I obtained a BMP which showed that her electrolytes were within normal limits. Her glucose was 76  for which she at some candy for, but otherwise her glucoses have been good.  Cardiac or neurologic etiology considered however I would find it strange that related symptoms would develop at the same time that spironolactone was started. I did obtain an EKG which was unrevealing. I also considered a pulmonary or infectious source to explain her dyspnea which she feels is worse today.  She has no cough and lungs are clear on exam. No systemic infectious symptoms. Plan -Spironolactone DISCONTINUED -Will see how sx progress over the next few days. If symptoms due not continue to improve, then I would consider obtaining a CXR and an echocardiogram for further evaluation.  -She will f/u in 1w.      Relevant Orders   BMP w Anion Gap (STAT/Sunquest-performed on-site) (Completed)    Other Visit Diagnoses    Dyspnea, unspecified type       Relevant Orders   EKG 12-Lead (Completed)        Pt discussed with Dr. Rejeana Brock, MD Internal Medicine Resident PGY-2 [facility] Pager: [phone] 03/18/2020 2:51 PM

## 2020-03-20 ENCOUNTER — Encounter: Payer: Medicare Other | Admitting: Internal Medicine

## 2020-03-25 ENCOUNTER — Telehealth: Payer: Self-pay

## 2020-03-25 NOTE — Telephone Encounter (Signed)
Called patient to notify that her patient assistance insulin had came in- Trulicity 2 boxes.  Advised patient to come by to pick it up, gave office hours and call back number if questions.

## 2020-03-28 NOTE — Progress Notes (Signed)
Internal Medicine Clinic Attending  Case discussed with Dr. Christian  At the time of the visit.  We reviewed the resident's history and exam and pertinent patient test results.  I agree with the assessment, diagnosis, and plan of care documented in the resident's note.  

## 2020-04-15 ENCOUNTER — Other Ambulatory Visit: Payer: Self-pay | Admitting: Student

## 2020-04-15 DIAGNOSIS — I10 Essential (primary) hypertension: Secondary | ICD-10-CM

## 2020-05-20 ENCOUNTER — Other Ambulatory Visit: Payer: Self-pay | Admitting: *Deleted

## 2020-05-20 DIAGNOSIS — I10 Essential (primary) hypertension: Secondary | ICD-10-CM

## 2020-05-20 MED ORDER — HYDROCHLOROTHIAZIDE 25 MG PO TABS: 25.0000 mg | ORAL_TABLET | Freq: Every day | ORAL | 3 refills | Status: DC

## 2020-06-05 ENCOUNTER — Other Ambulatory Visit: Payer: Self-pay

## 2020-06-05 ENCOUNTER — Ambulatory Visit (INDEPENDENT_AMBULATORY_CARE_PROVIDER_SITE_OTHER): Payer: Medicare HMO | Admitting: Internal Medicine

## 2020-06-05 VITALS — BP 156/78 | HR 71 | Temp 97.8°F | Ht 61.0 in | Wt 245.8 lb

## 2020-06-05 DIAGNOSIS — Z6841 Body Mass Index (BMI) 40.0 and over, adult: Secondary | ICD-10-CM

## 2020-06-05 DIAGNOSIS — R0602 Shortness of breath: Secondary | ICD-10-CM

## 2020-06-05 DIAGNOSIS — E11319 Type 2 diabetes mellitus with unspecified diabetic retinopathy without macular edema: Secondary | ICD-10-CM | POA: Insufficient documentation

## 2020-06-05 DIAGNOSIS — E038 Other specified hypothyroidism: Secondary | ICD-10-CM

## 2020-06-05 DIAGNOSIS — J4520 Mild intermittent asthma, uncomplicated: Secondary | ICD-10-CM

## 2020-06-05 DIAGNOSIS — I10 Essential (primary) hypertension: Secondary | ICD-10-CM | POA: Diagnosis not present

## 2020-06-05 DIAGNOSIS — E1165 Type 2 diabetes mellitus with hyperglycemia: Secondary | ICD-10-CM

## 2020-06-05 DIAGNOSIS — E890 Postprocedural hypothyroidism: Secondary | ICD-10-CM

## 2020-06-05 DIAGNOSIS — G3184 Mild cognitive impairment, so stated: Secondary | ICD-10-CM | POA: Diagnosis not present

## 2020-06-05 DIAGNOSIS — Z0000 Encounter for general adult medical examination without abnormal findings: Secondary | ICD-10-CM | POA: Insufficient documentation

## 2020-06-05 DIAGNOSIS — F321 Major depressive disorder, single episode, moderate: Secondary | ICD-10-CM

## 2020-06-05 DIAGNOSIS — Z794 Long term (current) use of insulin: Secondary | ICD-10-CM

## 2020-06-05 NOTE — Assessment & Plan Note (Signed)
T2DM managed by endocrinologist, though to simplify care, I am happy to provide management as well.  She has been seeing her endocrinologist for years and may wish to continue.

## 2020-06-05 NOTE — Telephone Encounter (Signed)
I'm going to make sure we don't need a change in the thyroid med (lab pending) and the PPI is OTC.  I'll get to it pronto!

## 2020-06-05 NOTE — Assessment & Plan Note (Signed)
No change since last visit.  Exertional SHOB only, as after climbing flight of stairs.  Feels as though air doesn't move easily in/out of lungs; minimal wheezing, no cough associated.  SHe was told at one time she has asthma.  I have not identified this problem on Care Everywhere review.

## 2020-06-05 NOTE — Assessment & Plan Note (Signed)
This is her most frustrating and negatively impactful problem.  SHe has met with dietician and feels that she has good knowledge about healthy eating patterns.  Has joined a gym [employer]) and desires to start walking there, though voices that it will be difficulty to incorporate this new habit into her routine.

## 2020-06-05 NOTE — Assessment & Plan Note (Signed)
Check hormone levels given her reports of increased cold intolerance, increased hair loss, decreased energy.

## 2020-06-05 NOTE — Assessment & Plan Note (Signed)
No increase in exertional dypsnea after stopping lung medications.

## 2020-06-05 NOTE — Assessment & Plan Note (Signed)
Hypertension is uncontrolled. Of note, she has experienced side effects with spironolactone and with amlodipine which were thus discontinued. Changing HCTZ to chlorthalidone for greater thiazide potency is an option. She doesn't do well with remembering to take medications so multiple dosing such as for hydralazine would not be ideal. I'll discuss further with her before making a decision.

## 2020-06-05 NOTE — Patient Instructions (Signed)
Great to meet you today, Ms. Ke!  We discussed medicine review, weight, and diabetes.  We will check your thyroid tests today to see if an adjustment in medicine is needed.  I will give some thought with medication options for weight loss, and I encourage you to MAKE YOURSELF start a walking program at the gym!  YOu can do this.  I believe in you.  Let's get together soon to continue our conversations.    Take care and stay well!  Dr. Mayford Knife

## 2020-06-05 NOTE — Progress Notes (Signed)
Katie Cook presents for BP follow-up, and to discuss her concerns about weight loss. I am her assigned PCP and am meeting her for the first time today.  Ms. Katie Cook heart is been struggling to lose weight for years. She feels that she has been making significant changes in her dietary patterns and is very discouraged that she has not seen results (particularly after beginning Trulicity, which was hoped to be potentially beneficial with losing a few pounds). She explains that she has met with the dietitian and is aware of healthy choices and portion management. She has recently joined a gym to which she has a free membership, though has not yet had an opportunity to visit, and does admit that incorporating formal exercise into her routine will require establishing new habits. She enjoys being active and does not consider herself to be a sedentary person.  Of note is her history of surgical hypothyroidism managed with medication. And problem based questioning, she states that she has felt more cold intolerant this past winter and has had less energy than usual. Hair loss seems to have accelerated recently. Her skin has been dry over the winter though she notes that this typically happens in the winter season. She rates her energy level as low.  T2 DM is managed by endocrinologist. Unfortunately the Trulicity has not helped with weight loss. At one time she was on 3 mg which caused GI symptoms, nearly resolved since resuming 1.5 mg lower dose. She has not had good luck in the past with oral diabetes medications. She is concerned that her insulin use may be contributing to issues. She is able to sense hypoglycemia, which occurs very infrequently.  She has been experiencing episodic severe nausea and abdominal cramping. Although she feels as though she may experience a vomiting episode, she has never been able to vomit over her lifetime. When this occurs, she takes Prilosec which does not immediately relieve  her symptoms. These episodes are infrequent and resolve on their own. She explains that she is very sensitive to medications and we will keep this in mind going forward.  No current allergy symptoms, which occur primarily in the fall and in the spring. She reports that she gets a "sinus infection" every year in the fall. She is not currently requiring her Nasonex, though she continues to take her Xyzal year-round.  It is unclear if she truly has asthma. She is not currently taking her breathing medications based on been instructed not to (unfortunately, they were not discontinued from her list). Early breathing discomfort occurs with exertion, such as after walking up the stairs-she feels as though her breath does not come easily and her chest feels tight. Infrequent wheezing, no associated cough. At one time, PFT off of medication was entertained. She reports having PFT many years ago.  Nystatin cream works well for episodic inguinal intertrigo which she is not experiencing now.  Medications reviewed: She admits to having difficulty remembering whether she has taken them, we discussed trying a pillbox.  New insurance is [employer] which requires new prescriptions for 90-day fills to a [facility] specific mail order pharmacy. Daily medications all need to be renewed in this manner.  Patient Active Problem List   Diagnosis Date Noted  . Numbness 03/04/2020  . Shortness of breath 03/04/2020  . Class 3 obesity 02/15/2020  . Rib pain on right side 01/04/2020  . Fatigue 11/01/2019  . Pruritic rash 11/01/2019  . Hypertension 09/17/2019  . Asthma 09/17/2019  . Class 3  drug-induced obesity in adult 07/28/2016  . Hypothyroidism 07/26/2013  . Uncontrolled type 2 diabetes mellitus with hyperglycemia, with long-term current use of insulin ([place]) 07/26/2013    Current Outpatient Medications:  .  carvedilol (COREG) 25 MG tablet, TAKE 1 TABLET BY MOUTH  TWICE DAILY WITH A MEAL, Disp: 180 tablet, Rfl: 3 .   Dulaglutide (TRULICITY) 3 NW/2.9FA SOPN, Inject 3 mg as directed once a week., Disp: 2.5 mL, Rfl: 3 .  glucose blood (ONETOUCH VERIO) test strip, USE TO TEST BLOOD SUGAR 3  TO 4 TIMES DAILY AS  INSTRUCTED, Disp: 400 each, Rfl: 1 .  hydrochlorothiazide (HYDRODIURIL) 25 MG tablet, Take 1 tablet (25 mg total) by mouth daily., Disp: 90 tablet, Rfl: 3 .  insulin degludec (TRESIBA FLEXTOUCH) 200 UNIT/ML FlexTouch Pen, Inject 50 Units into the skin daily., Disp: 3 mL, Rfl: 0 .  Insulin Lispro (HUMALOG KWIKPEN) 200 UNIT/ML SOPN, Inject 25-30 Units into the skin 3 (three) times daily before meals., Disp: 30 pen, Rfl: 3 .  Insulin Pen Needle 32G X 4 MM MISC, Use 4 times a day with insulin pen., Disp: 400 each, Rfl: 1 .  Lancets (ONETOUCH ULTRASOFT) lancets, Use to test blood sugar 3 times daily as instructed. Dx: E11.65, Disp: 300 each, Rfl: 3 .  levocetirizine (XYZAL) 5 MG tablet, Take 1 tablet (5 mg total) by mouth every evening., Disp: 30 tablet, Rfl: 5 .  levothyroxine (EUTHYROX) 150 MCG tablet, Take 1 tablet (150 mcg total) by mouth daily before breakfast., Disp: 90 tablet, Rfl: 3 .  losartan (COZAAR) 100 MG tablet, Take 1 tablet (100 mg total) by mouth daily., Disp: 90 tablet, Rfl: 5 .  Nystatin (GERHARDT'S BUTT CREAM) CREA, Apply 1 application topically 2 (two) times daily., Disp: 1 each, Rfl: 0 .  nystatin cream (MYCOSTATIN), Apply 1 application topically 2 (two) times daily. Ok to provide two tubes of 30 grs., Disp: 60 g, Rfl: 1 .  omeprazole (PRILOSEC) 20 MG capsule, Take 20 mg by mouth 2 (two) times daily., Disp: , Rfl:  .  triamcinolone ointment (KENALOG) 0.5 %, Apply 1 application topically 2 (two) times daily., Disp: 15 g, Rfl: 1  Allergies  Allergen Reactions  . Metformin And Related Diarrhea    and dizziness  . Amlodipine Swelling  . Ceclor [Cefaclor] Rash  . Sulfa Antibiotics Rash   Objective   BP (!) 156/78 (BP Location: Right Arm, Patient Position: Sitting, Cuff Size: Normal)    Pulse 71   Temp 97.8 F (36.6 C) (Oral)   Ht 5' 1" (1.549 m)   Wt 245 lb 12.8 oz (111.5 kg)   SpO2 100%   BMI 46.44 kg/m   [place] is obese. Nicely dressed and groomed, pleasant in interaction. Breathing is comfortable during conversation and ambulation.  Assessment and plan: Greater than 50% of today's visit was spent with education and counseling regarding healthy lifestyle (exercise and diet). Given symptoms which could be consistent with underreplaced thyroid, TSH and FT4 will be assessed. Unfortunately she is not able to tolerate a higher dose of Trulicity which could have a role in weight loss. I will give this additional thought. She seems to be very motivated though somehow her caloric intake and expenditures are not balanced in favor of weight loss. This goes back many years.    I advised her to try Mylanta/Maalox/Alka-Seltzer or similar OTC medications for indigestion which may have quicker symptom relief than the PPI. I'm not yet clear if these are symptoms of medication intolerance.  They do not seem to be consistent with a biliary or gastritis etiology, nor are they of the pattern of a diabetic gastroparesis. Continue to monitor.  Hypertension is uncontrolled and after reviewing her chart further, I will convey suggestions for improved management to her when I report her thyroid test results at a future phone call. Of note, she has experienced side effects with spironolactone and with amlodipine which were thus discontinued. Change HCTZ to chlorthalidone for greater thiazide potency is an option. She doesn't do well with remembering to take medications so multiple dosing such as for hydralazine would not be ideal.   See problem based documentation. All prescriptions will be written for specifications of [employer].  Next visit: Urine microalbuminuria? Cholesterol/statin? Eye exam/retinopathy (last visit 09/2019) Order mammogram Vaccines? CRC  screening? Mood/counseling? Memory compared to 2017?

## 2020-06-05 NOTE — Assessment & Plan Note (Signed)
Noted after today's visit on review of chart.  Dx'ed by clinical psychologist as an outcome of neuropsych assessment for [facility] 2017.  I'll investigate this further.

## 2020-06-06 LAB — TSH: TSH: 3.11 u[IU]/mL (ref 0.450–4.500)

## 2020-06-06 LAB — T4, FREE: Free T4: 1.49 ng/dL (ref 0.82–1.77)

## 2020-06-11 ENCOUNTER — Other Ambulatory Visit: Payer: Self-pay | Admitting: *Deleted

## 2020-06-11 MED ORDER — OMEPRAZOLE 20 MG PO CPDR: 20.0000 mg | DELAYED_RELEASE_CAPSULE | Freq: Two times a day (BID) | ORAL | 3 refills | Status: DC

## 2020-06-11 MED ORDER — LEVOTHYROXINE SODIUM 150 MCG PO TABS: 150.0000 ug | ORAL_TABLET | Freq: Every day | ORAL | 3 refills | Status: DC

## 2020-06-12 ENCOUNTER — Other Ambulatory Visit: Payer: Self-pay | Admitting: *Deleted

## 2020-06-12 DIAGNOSIS — I10 Essential (primary) hypertension: Secondary | ICD-10-CM

## 2020-06-12 MED ORDER — HYDROCHLOROTHIAZIDE 25 MG PO TABS: 25.0000 mg | ORAL_TABLET | Freq: Every day | ORAL | 3 refills | Status: DC

## 2020-06-12 MED ORDER — CARVEDILOL 25 MG PO TABS: 25.0000 mg | ORAL_TABLET | Freq: Two times a day (BID) | ORAL | 3 refills | Status: AC

## 2020-06-12 MED ORDER — LOSARTAN POTASSIUM 100 MG PO TABS: 100.0000 mg | ORAL_TABLET | Freq: Every day | ORAL | 5 refills | Status: AC

## 2020-06-12 NOTE — Telephone Encounter (Signed)
Katie Cook from [facility] requesting refills; states pt has [facility] now.

## 2020-06-13 ENCOUNTER — Ambulatory Visit: Payer: Medicare HMO | Admitting: Internal Medicine

## 2020-06-13 ENCOUNTER — Encounter: Payer: Self-pay | Admitting: Internal Medicine

## 2020-06-13 ENCOUNTER — Other Ambulatory Visit: Payer: Self-pay

## 2020-06-13 VITALS — BP 120/78 | HR 80 | Ht 61.0 in | Wt 244.8 lb

## 2020-06-13 DIAGNOSIS — E890 Postprocedural hypothyroidism: Secondary | ICD-10-CM

## 2020-06-13 DIAGNOSIS — Z6841 Body Mass Index (BMI) 40.0 and over, adult: Secondary | ICD-10-CM

## 2020-06-13 DIAGNOSIS — Z794 Long term (current) use of insulin: Secondary | ICD-10-CM

## 2020-06-13 DIAGNOSIS — E1165 Type 2 diabetes mellitus with hyperglycemia: Secondary | ICD-10-CM

## 2020-06-13 LAB — POCT GLYCOSYLATED HEMOGLOBIN (HGB A1C): Hemoglobin A1C: 8.9 % — AB (ref 4.0–5.6)

## 2020-06-13 NOTE — Patient Instructions (Signed)
Please continue: - Tresiba U200 50 units day - Trulicity 1.5 mg weekly  Please change: - Humalog 35-40 units 2x a day  I referred you to the [facility].  Please continue levothyroxine 150 mcg daily.  Take the thyroid hormone every day, with water, at least 30 minutes before breakfast, separated by at least 4 hours from: - acid reflux medications - calcium - iron - multivitamins  Please return in 3-4 months with your sugar log.

## 2020-06-13 NOTE — Progress Notes (Signed)
Patient ID: Katie Cook, female   DOB: [birth year], 65 y.o.   MRN: [id number]  This visit occurred during the SARS-CoV-2 public health emergency.  Safety protocols were in place, including screening questions prior to the visit, additional usage of staff PPE, and extensive cleaning of exam room while observing appropriate contact time as indicated for disinfecting solutions.   HPI: Katie Cook is a 65 y.o.-year-old female, presenting for f/u for DM2, insulin-dependent, uncontrolled, without long term complications, on insulin since 2008 and postsurgical hypothyroidism (after she had compression sx with MNG - noncancerous). Last visit 4 months ago.  Hypothyroidism: Reviewed history: - she had MNG >> developed compression sxs - Thyroidectomy 2011-2012 - hypothyroidism >> Levothyroxine, then Armour, then levothyroxine, then Nature-Throid, then NP thyroid, but not levothyroxine due to price  She is on levothyroxine 150 mcg daily: - in am - fasting - at least 30 min from b'fast - no calcium - no iron - + multivitamins later in the day - + PPIs later in the day - not on Biotin   Reviewed her TFTs: Lab Results  Component Value Date   TSH 3.110 06/05/2020   TSH 1.34 02/15/2020   TSH 0.339 (L) 11/01/2019   TSH 0.85 08/14/2019   TSH 0.48 04/20/2018   FREET4 1.49 06/05/2020   FREET4 1.16 02/15/2020   FREET4 1.43 08/14/2019   FREET4 1.51 04/20/2018   FREET4 1.38 12/26/2017  04/2013: TSH 0.4 (at the [facility])  Pt denies: - feeling nodules in neck - hoarseness - dysphagia - choking - SOB with lying down  DM2: - h/o GDM during pregnancy and type 2 diabetes in 2005  Reviewed HbA1c Lab Results  Component Value Date   HGBA1C 7.6 (A) 02/15/2020   HGBA1C 9.1 (A) 08/14/2019   HGBA1C 8.6 (A) 04/20/2018  ~8% 04/2013.  Pt is on a regimen of: - Tresiba U200 50 >> 54 >> 60 >> 50 units daily. - Humalog 25-30 >> 30 units in a.m. and 35 units before dinner >> 35 units 2x a  day >> Trulicity 1.5 mg weekly-restarted 01/2020 -occasional nausea >> 3 mg weekly >> AP and nausea Could not afford: Januvia 100 mg daily, Tradjenta 5 mg in am She was Invokana 100 mg - added 01/2014 >> initially stopped for diarrhea >> restarted 06/2014 >> but N/D >> restarted 10/2014 >> yeast inf >> stopped She stopped  Metformin XR 500 mg at lunchtime and 1000 mg at dinner - started 09/2013 >> nausea, diarrhea. She stopped Victoza >> nausea, 3 years. She tried Actos. She tried regular Metformin >> nausea. She tried Prandin.  Pt checks her sugars 2-5 times a day: - am:  70, 190-220 >> 99-221, 306 >> 87-157, 187, 258 >> 130-355 - before lunch: 76-209 >> 200s >> 52, 158-260, 320 >> n/c >> 82-238 - 2h after lunch: 133, 198-220 >> n/c >> 352, 473 >> 170 >> 84-218 - before dinner: 174-322 >> 200s >> 139, 195, 334 >> n/c - 2h after dinner: 392 >> 87, 113, 220, 418 >> 177-328  - bedtime: n/c >> 89, 101-200, 217 >> n/c >> 220-472 - nighttime:  up to 250 >> 176-233, 343 >> 114-381 Lowest sugar was 76 >>70 >> 52 >> 76; she has hypoglycemia awareness in the 70s. Highest sugar was 531 >>...473 >> 418 x1 >> 472.  Meter: AccuChek [employer].  Pt's meals: - Breakfast: yoghurt + fruit - Lunch: salad with [place] - Dinner: chicken, broccoli, meat - Snacks: 2-3: fruit, popcorn  -No CKD,  last BUN/creatinine:  Lab Results  Component Value Date   BUN 11 03/17/2020   CREATININE 0.51 03/17/2020  04/19/2018: Glucose 125, BUN/creatinine 15/0.55, EGFR 112, rest of the CMP normal 03/19/2014: 17/0.75 On losartan. + History of hyperlipidemia: Lab Results  Component Value Date   CHOL 161 08/14/2019   HDL 48.50 08/14/2019   LDLCALC 97 08/14/2019   TRIG 80.0 08/14/2019   CHOLHDL 3 08/14/2019  She is not on a statin. - last eye exam was in 2021: Reportedly no DR, + cataract; Dr. Delman Cook. - no numbness and tingling in her feet.  ROS: Constitutional: no weight gain/no weight loss, no fatigue, no subjective  hyperthermia, no subjective hypothermia Eyes: no blurry vision, no xerophthalmia ENT: no sore throat, no nodules palpated in neck, no dysphagia, no odynophagia, no hoarseness Cardiovascular: no CP/no SOB/no palpitations/no leg swelling Respiratory: no cough/no SOB/no wheezing Gastrointestinal: no N/no V/no D/no C/no acid reflux Musculoskeletal: no muscle aches/no joint aches Skin: no rashes, no hair loss Neurological: no tremors/no numbness/no tingling/no dizziness  I reviewed pt's medications, allergies, PMH, social hx, family hx, and changes were documented in the history of present illness. Otherwise, unchanged from my initial visit note.  Past Medical History:  Diagnosis Date  . Diabetes mellitus without complication ([place])   . Hypertension   . Thyroid disease   . Uncontrolled diabetes mellitus type 2 without complications 10/13/5007   Past Surgical History:  Procedure Laterality Date  . CESAREAN SECTION    . THYROID SURGERY     Social History   Socioeconomic History  . Marital status: Legally Separated    Spouse name: Not on file  . Number of children: 3  . Years of education: Some college  . Highest education level: Some college, no degree  Occupational History  . Not on file  Tobacco Use  . Smoking status: Never Smoker  . Smokeless tobacco: Never Used  Vaping Use  . Vaping Use: Never used  Substance and Sexual Activity  . Alcohol use: No    Alcohol/week: 0.0 standard drinks  . Drug use: No  . Sexual activity: Not Currently  Other Topics Concern  . Not on file  Social History Narrative   Lives alone   Caffeine use: Decaf tea. Soda rarely        Social Determinants of Radio broadcast assistant Strain: Not on file  Food Insecurity: Not on file  Transportation Needs: Not on file  Physical Activity: Not on file  Stress: Not on file  Social Connections: Not on file  Intimate Partner Violence: Not on file   Current Outpatient Medications on File Prior to  Visit  Medication Sig Dispense Refill  . carvedilol (COREG) 25 MG tablet Take 1 tablet (25 mg total) by mouth 2 (two) times daily with a meal. 180 tablet 3  . Dulaglutide (TRULICITY) 3 FG/1.8EX SOPN Inject 3 mg as directed once a week. 2.5 mL 3  . glucose blood (ONETOUCH VERIO) test strip USE TO TEST BLOOD SUGAR 3  TO 4 TIMES DAILY AS  INSTRUCTED 400 each 1  . hydrochlorothiazide (HYDRODIURIL) 25 MG tablet Take 1 tablet (25 mg total) by mouth daily. 90 tablet 3  . insulin degludec (TRESIBA FLEXTOUCH) 200 UNIT/ML FlexTouch Pen Inject 50 Units into the skin daily. 3 mL 0  . Insulin Lispro (HUMALOG KWIKPEN) 200 UNIT/ML SOPN Inject 25-30 Units into the skin 3 (three) times daily before meals. 30 pen 3  . Insulin Pen Needle 32G X 4 MM MISC  Use 4 times a day with insulin pen. 400 each 1  . Lancets (ONETOUCH ULTRASOFT) lancets Use to test blood sugar 3 times daily as instructed. Dx: E11.65 300 each 3  . levocetirizine (XYZAL) 5 MG tablet Take 1 tablet (5 mg total) by mouth every evening. 30 tablet 5  . levothyroxine (EUTHYROX) 150 MCG tablet Take 1 tablet (150 mcg total) by mouth daily before breakfast. 90 tablet 3  . losartan (COZAAR) 100 MG tablet Take 1 tablet (100 mg total) by mouth daily. 90 tablet 5  . nystatin cream (MYCOSTATIN) Apply 1 application topically 2 (two) times daily. Ok to provide two tubes of 30 grs. 60 g 1  . omeprazole (PRILOSEC) 20 MG capsule Take 1 capsule (20 mg total) by mouth 2 (two) times daily. 180 capsule 3   No current facility-administered medications on file prior to visit.   Allergies  Allergen Reactions  . Metformin And Related Diarrhea    and dizziness  . Amlodipine Swelling  . Ceclor [Cefaclor] Rash  . Sulfa Antibiotics Rash   Family History  Problem Relation Age of Onset  . Ovarian cancer Mother   . Cancer Mother   . Cancer Father        pancreatic   . Stroke Father   . Diabetes Sister   . Cancer Maternal Aunt        breast cancer   . Diabetes  Maternal Grandmother   . Cancer Maternal Aunt        thyroid   . Hypertension Sister   . Stroke Paternal Uncle     PE: BP 120/78 (BP Location: Right Arm, Patient Position: Sitting, Cuff Size: Large)   Pulse 80   Ht 5' 1"  (1.549 m)   Wt 244 lb 12.8 oz (111 kg)   SpO2 97%   BMI 46.25 kg/m  Body mass index is 46.25 kg/m.  Wt Readings from Last 3 Encounters:  06/13/20 244 lb 12.8 oz (111 kg)  06/05/20 245 lb 12.8 oz (111.5 kg)  03/17/20 245 lb 3.2 oz (111.2 kg)   Constitutional: overweight, in NAD Eyes: PERRLA, EOMI, no exophthalmos ENT: moist mucous membranes, no thyromegaly, no cervical lymphadenopathy Cardiovascular: RRR, No MRG Respiratory: CTA B Gastrointestinal: abdomen soft, NT, ND, BS+ Musculoskeletal: no deformities, strength intact in all 4 Skin: moist, warm, no rashes Neurological: no tremor with outstretched hands, DTR normal in all 4  ASSESSMENT: 1. DM2, insulin-dependent, uncontrolled, without long term complications, but with hyperglycemia - I suggested Invokana in the past, to also help with her high BP and also her obesity, but she did not start afraid of side effects.  - she thinks Engineer, agricultural not helping much >> changed to [place]. This would be preferred also b/c of her tendency for low CBGs, which Tyler Aas may reduce.  2. Hypothyroidism - postsurgical - thyroidectomy 2011 - h/o benign MNG  3. Obesity class III  PLAN:  1. Patient with longstanding, uncontrolled, type 2 diabetes, basal-bolus insulin regimen and weekly GLP-1 receptor agonist.  She continues to get patient assistance from [name] in her sugars started to improve after she joined this program.  At last visit HbA1c was better, at 7.6% and sugars appeared improved, with few exceptions.  She did have some nausea with Trulicity but wanted to continue to see improvement in the blood sugars.  She had occasional low blood sugars in the morning after correcting high blood sugars at bedtime with  Humalog.  I advised her to decrease the correction  dose.  Since sugars appear to be slightly higher around 10-11 PM, we did discuss that she may need a higher dose of Humalog with a larger dinner.  However, we did not change the overall regimen at that time. - at today's visit, we reviewed together her glucometer downloads.  Sugars are extremely fluctuating, without clearly low blood sugars, but with hyperglycemic values up to 400s.  It is unclear whether these are due to forgetting the insulin before meals, exercise, dietary indiscretions, or other factors.  We discussed that the most important thing at this point would be to start writing down possible reasons for the unusual blood sugars.  She does write some of these down and I advised him to bring these at next visit. -I also advised her to increase the Humalog slightly before a larger meal. -She had significant nausea from the 3 mg of Trulicity, so she was off the medication and restarted at 1.5 mg weekly.  Her nausea is better with this formulation and we discussed about trying another dose to see if her nausea continues to decrease.  In the past, she tolerated a 1.5 mg fairly well. -We also discussed about the exercise effect on blood sugars -I will refer her to weight management clinic.  I hope that the more structured meal plan will help with her blood sugars control, also - I advised her to:  Patient Instructions  Please continue: - Tresiba U200 50 units day - Trulicity 1.5 mg weekly  Please change: - Humalog 35-40 units 2x a day  I referred you to the [facility].  Please continue levothyroxine 150 mcg daily.  Take the thyroid hormone every day, with water, at least 30 minutes before breakfast, separated by at least 4 hours from: - acid reflux medications - calcium - iron - multivitamins  Please return in 3-4 months with your sugar log.   - we checked her HbA1c: 8.9% (higher) - advised to check sugars at different  times of the day - 3x a day, rotating check times - advised for yearly eye exams >> she is UTD - return to clinic in 3-4 months    2. Hypothyroidism - Uncontrolled in the past likely due to incomplete compliance with levothyroxine - latest thyroid labs reviewed with pt >> normal: Lab Results  Component Value Date   TSH 3.110 06/05/2020   - she continues on LT4 150 mcg daily (dose decreased by PCP 10/2019). - pt feels good on this dose. - we discussed about taking the thyroid hormone every day, with water, >30 minutes before breakfast, separated by >4 hours from acid reflux medications, calcium, iron, multivitamins. Pt. is taking it correctly.  3. Obesity class III -We will continue the GLP-1 receptor agonist which should also help with weight loss -At last visit weight was stable -still stable now, as she is very frustrated about the fact that she is not losing weight despite exercising -We discussed at this visit about a referral to the weight management clinic.  She agrees with this.  Philemon Kingdom, MD PhD [facility]

## 2020-06-20 DIAGNOSIS — E559 Vitamin D deficiency, unspecified: Secondary | ICD-10-CM | POA: Diagnosis not present

## 2020-06-20 DIAGNOSIS — I10 Essential (primary) hypertension: Secondary | ICD-10-CM | POA: Diagnosis not present

## 2020-06-20 DIAGNOSIS — M4800 Spinal stenosis, site unspecified: Secondary | ICD-10-CM | POA: Diagnosis not present

## 2020-06-20 DIAGNOSIS — E890 Postprocedural hypothyroidism: Secondary | ICD-10-CM | POA: Diagnosis not present

## 2020-06-20 DIAGNOSIS — I2510 Atherosclerotic heart disease of native coronary artery without angina pectoris: Secondary | ICD-10-CM | POA: Diagnosis not present

## 2020-06-20 DIAGNOSIS — K760 Fatty (change of) liver, not elsewhere classified: Secondary | ICD-10-CM | POA: Diagnosis not present

## 2020-06-20 DIAGNOSIS — E1165 Type 2 diabetes mellitus with hyperglycemia: Secondary | ICD-10-CM | POA: Diagnosis not present

## 2020-06-20 DIAGNOSIS — Z79899 Other long term (current) drug therapy: Secondary | ICD-10-CM | POA: Diagnosis not present

## 2020-07-03 ENCOUNTER — Encounter (INDEPENDENT_AMBULATORY_CARE_PROVIDER_SITE_OTHER): Payer: Self-pay

## 2020-07-03 DIAGNOSIS — I10 Essential (primary) hypertension: Secondary | ICD-10-CM | POA: Diagnosis not present

## 2020-07-03 DIAGNOSIS — R5381 Other malaise: Secondary | ICD-10-CM | POA: Diagnosis not present

## 2020-07-03 DIAGNOSIS — Z008 Encounter for other general examination: Secondary | ICD-10-CM | POA: Diagnosis not present

## 2020-07-03 DIAGNOSIS — R5383 Other fatigue: Secondary | ICD-10-CM | POA: Diagnosis not present

## 2020-07-03 DIAGNOSIS — E559 Vitamin D deficiency, unspecified: Secondary | ICD-10-CM | POA: Diagnosis not present

## 2020-07-03 DIAGNOSIS — E1165 Type 2 diabetes mellitus with hyperglycemia: Secondary | ICD-10-CM | POA: Diagnosis not present

## 2020-07-03 DIAGNOSIS — W19XXXA Unspecified fall, initial encounter: Secondary | ICD-10-CM | POA: Diagnosis not present

## 2020-07-03 DIAGNOSIS — Z794 Long term (current) use of insulin: Secondary | ICD-10-CM | POA: Diagnosis not present

## 2020-07-09 ENCOUNTER — Other Ambulatory Visit (HOSPITAL_COMMUNITY): Payer: Self-pay | Admitting: Student

## 2020-07-09 DIAGNOSIS — R5383 Other fatigue: Secondary | ICD-10-CM

## 2020-07-09 DIAGNOSIS — I10 Essential (primary) hypertension: Secondary | ICD-10-CM

## 2020-07-17 ENCOUNTER — Encounter: Payer: Medicare HMO | Admitting: Internal Medicine

## 2020-07-28 ENCOUNTER — Other Ambulatory Visit: Payer: Self-pay | Admitting: Internal Medicine

## 2020-08-08 ENCOUNTER — Telehealth: Payer: Self-pay

## 2020-08-08 NOTE — Telephone Encounter (Signed)
Pt returned call. Informed pt that her insurance no longer covers the onetouch verio meter. Gave her options of which ones they do cover. Pt will go with the accu-chek guide me meter. Please send rx and for strips. thanks

## 2020-08-08 NOTE — Telephone Encounter (Addendum)
Called pt left vm to callback about refill for her test strips.  Pt insurance is no longer covering onetouch only accu-chek or true metrix. Need to know if she would like to switch to one of these or pay out of pocket for onetouch

## 2020-08-11 ENCOUNTER — Ambulatory Visit (HOSPITAL_COMMUNITY): Payer: Medicare HMO | Attending: Cardiovascular Disease

## 2020-08-11 ENCOUNTER — Other Ambulatory Visit: Payer: Self-pay

## 2020-08-11 DIAGNOSIS — I10 Essential (primary) hypertension: Secondary | ICD-10-CM | POA: Diagnosis present

## 2020-08-11 DIAGNOSIS — R5383 Other fatigue: Secondary | ICD-10-CM | POA: Diagnosis not present

## 2020-08-11 LAB — ECHOCARDIOGRAM COMPLETE
Area-P 1/2: 3.7 cm2
MV VTI: 2.23 cm2
S' Lateral: 3.1 cm

## 2020-08-11 MED ORDER — ACCU-CHEK GUIDE VI STRP: ORAL_STRIP | 12 refills | Status: AC

## 2020-08-11 MED ORDER — ACCU-CHEK GUIDE ME W/DEVICE KIT: PACK | 0 refills | Status: DC

## 2020-08-11 NOTE — Telephone Encounter (Signed)
Patient called to advise the the Accu-check [place] me meter and test strips were not sent to the [employer] on [address].  She has not been able to pick them up and is without ability to test blood sugar

## 2020-08-11 NOTE — Telephone Encounter (Signed)
Rx have been sent for Accu-Chek Guide me meter and test strips.

## 2020-08-11 NOTE — Telephone Encounter (Signed)
Called pt to inform her that Rx for Accu-chek meter and test strips has been sent.

## 2020-08-14 ENCOUNTER — Telehealth: Payer: Self-pay | Admitting: Internal Medicine

## 2020-08-14 ENCOUNTER — Encounter: Payer: Self-pay | Admitting: *Deleted

## 2020-08-14 NOTE — Progress Notes (Unsigned)

## 2020-08-14 NOTE — Telephone Encounter (Signed)
Called pt informed her that we did receive application. She will come to pick it up today. Also, she states she needs and application for [facility] as well both are ready for pick up.

## 2020-08-14 NOTE — Telephone Encounter (Signed)
Patient states [employer] has sent an application for 2022 here for patient to come in and complete. Have we received this yet? Patient gets the Evaristo Bury is what she gets through them. Ph# [phone]

## 2020-08-20 NOTE — Telephone Encounter (Addendum)
Application sent to [employer] 08/19/2020. Application sent to [facility] 08/19/2020 Confirmation received.

## 2020-08-22 NOTE — Telephone Encounter (Signed)
Pt calling in to f/u on last message voiced that the medication was approved for theInsulin Lispro (HUMALOG KWIKPEN) 200 UNIT/ML SOPN  but Dulaglutide (TRULICITY) 3 MG/0.5ML SOPN paperwork was not filled out correctly on pages 5 and 6 this one is for [facility]. Pt states that she needs 1.5MG  instead of the 3MG  and would like a call back from the nurse.

## 2020-08-27 NOTE — Telephone Encounter (Signed)
Pt calling to f/u on the last message and states that Medicare is missing part B insurance on the application  for the approval of the Trulicity 1.5.

## 2020-08-27 NOTE — Telephone Encounter (Signed)
Pt would like a call back

## 2020-09-01 NOTE — Telephone Encounter (Signed)
Patient called re: [facility] told Patient that they received pages 5 and 6 for Trulicity, however, now [name] that the RX that was sent to them for Trulicity did not have a date on it, therefore, they will fill the RX without a date on it.  Patient requests a new RX for Trulicity 1.5 with the date on it be sent to [name]

## 2020-09-05 NOTE — Telephone Encounter (Signed)
Patient called again re:  Katie Cook has still not received the following RX request:  Patient requests a new RX for Trulicity 1.5 with the date on it be sent to [name] asap

## 2020-09-10 ENCOUNTER — Telehealth: Payer: Self-pay | Admitting: Internal Medicine

## 2020-09-10 NOTE — Telephone Encounter (Signed)
Patient called stating Katie Cook is still missing the date for the Rx for Trulicity 1.5. Asked if we could send again to fax# [phone]

## 2020-09-10 NOTE — Telephone Encounter (Signed)
New message    Patient calling the office for samples of medication:   1.  What medication and dosage are you requesting samples for?Dulaglutide (TRULICITY) 3 MG/0.5ML SOPN  2.  Are you currently out of this medication? Yes .

## 2020-09-11 NOTE — Telephone Encounter (Signed)
Pt contacted via MyChart message. 

## 2020-09-11 NOTE — Telephone Encounter (Signed)
Application completed and faxed to [name].

## 2020-09-12 ENCOUNTER — Ambulatory Visit: Payer: Medicare HMO | Admitting: Internal Medicine

## 2020-09-12 NOTE — Telephone Encounter (Signed)
Patient called re: Patient requests new  RX for Trulicity 1.5 mg to inject one time per week with instructions be sent to [name].  Patient states [name] received a RX for 3.0mg  with no instructions.  Patient states if questions please contact Patient at ph# [phone]

## 2020-09-17 NOTE — Telephone Encounter (Signed)
Pt calling to f/u on last message.[facility] got the prescription and it needs to be sent back in stating 1.5 once a week with a date needs to be sent in. [employer] has also sent over a fax for a new meter and test strips   [facility] - [place], [place] - [address]

## 2020-09-22 NOTE — Telephone Encounter (Signed)
Erie Noe from [employer] calling regarding Trulicity. Stating that they can take a verbal order regarding the dose.

## 2020-09-24 NOTE — Telephone Encounter (Signed)
Followed up in another encounter.

## 2020-09-24 NOTE — Telephone Encounter (Signed)
RX [employer] is calling regarding needing a verbal order regarding [employer] Number [phone]

## 2020-09-25 NOTE — Telephone Encounter (Signed)
Called [facility] at [phone] and provided rx for Trulicity.

## 2020-10-14 ENCOUNTER — Encounter: Payer: Self-pay | Admitting: *Deleted

## 2020-11-27 ENCOUNTER — Other Ambulatory Visit: Payer: Self-pay

## 2020-11-27 ENCOUNTER — Encounter: Payer: Self-pay | Admitting: Podiatry

## 2020-11-27 ENCOUNTER — Ambulatory Visit (INDEPENDENT_AMBULATORY_CARE_PROVIDER_SITE_OTHER): Payer: Medicare HMO | Admitting: Podiatry

## 2020-11-27 DIAGNOSIS — B351 Tinea unguium: Secondary | ICD-10-CM | POA: Diagnosis not present

## 2020-11-27 DIAGNOSIS — M79675 Pain in left toe(s): Secondary | ICD-10-CM

## 2020-11-27 DIAGNOSIS — E1142 Type 2 diabetes mellitus with diabetic polyneuropathy: Secondary | ICD-10-CM

## 2020-11-27 DIAGNOSIS — E1165 Type 2 diabetes mellitus with hyperglycemia: Secondary | ICD-10-CM | POA: Diagnosis not present

## 2020-11-27 DIAGNOSIS — M79674 Pain in right toe(s): Secondary | ICD-10-CM

## 2020-11-27 DIAGNOSIS — Z794 Long term (current) use of insulin: Secondary | ICD-10-CM

## 2020-11-27 MED ORDER — CICLOPIROX 8 % EX SOLN: Freq: Every day | CUTANEOUS | 3 refills | Status: DC

## 2020-11-27 NOTE — Patient Instructions (Signed)
EXERCISES- RANGE OF MOTION (ROM) AND STRETCHING EXERCISES Restoring tissue flexibility helps normal motion to return to the joints. This allows healthier, less painful movement and activity. An effective stretch should be held for at least 30 seconds. A stretch should never be painful. You should only feel a gentle lengthening or release in the stretched tissue.  RANGE OF MOTION - Toe Extension, Flexion Sit with your right / left leg crossed over your opposite knee. Grasp your toes and gently pull them back toward the top of your foot. You should feel a stretch on the bottom of your toes and/or foot. Hold this stretch for 10 seconds. Now, gently pull your toes toward the bottom of your foot. You should feel a stretch on the top of your toes and or foot. Hold this stretch for 10 seconds. Repeat  times. Complete this stretch 3 times per day.   RANGE OF MOTION - Ankle Dorsiflexion, Active Assisted Remove shoes and sit on a chair that is preferably not on a carpeted surface. Place right / left foot under knee. Extend your opposite leg for support. Keeping your heel down, slide your right / left foot back toward the chair until you feel a stretch at your ankle or calf. If you do not feel a stretch, slide your bottom forward to the edge of the chair, while still keeping your heel down. Hold this stretch for 10 seconds. Repeat 3 times. Complete this stretch 2 times per day.   STRETCH  Gastroc, Standing Place hands on wall. Extend right / left leg, keeping the front knee somewhat bent. Slightly point your toes inward on your back foot. Keeping your right / left heel on the floor and your knee straight, shift your weight toward the wall, not allowing your back to arch. You should feel a gentle stretch in the right / left calf. Hold this position for 10 seconds. Repeat 3 times. Complete this stretch 2 times per day.  STRETCH  Soleus, Standing Place hands on wall. Extend right / left leg,  keeping the other knee somewhat bent. Slightly point your toes inward on your back foot. Keep your right / left heel on the floor, bend your back knee, and slightly shift your weight over the back leg so that you feel a gentle stretch deep in your back calf. Hold this position for 10 seconds. Repeat 3 times. Complete this stretch 2 times per day.  STRETCH  Gastrocsoleus, Standing  Note: This exercise can place a lot of stress on your foot and ankle. Please complete this exercise only if specifically instructed by your caregiver.  Place the ball of your right / left foot on a step, keeping your other foot firmly on the same step. Hold on to the wall or a rail for balance. Slowly lift your other foot, allowing your body weight to press your heel down over the edge of the step. You should feel a stretch in your right / left calf. Hold this position for 10 seconds. Repeat this exercise with a slight bend in your right / left knee. Repeat 3 times. Complete this stretch 2 times per day.   STRENGTHENING EXERCISES - Plantar Fasciitis (Heel Spur Syndrome)  These exercises may help you when beginning to rehabilitate your injury. They may resolve your symptoms with or without further involvement from your physician, physical therapist or athletic trainer. While completing these exercises, remember:  Muscles can gain both the endurance and the strength needed for everyday activities through controlled  exercises. Complete these exercises as instructed by your physician, physical therapist or athletic trainer. Progress the resistance and repetitions only as guided.  STRENGTH - Towel Curls Sit in a chair positioned on a non-carpeted surface. Place your foot on a towel, keeping your heel on the floor. Pull the towel toward your heel by only curling your toes. Keep your heel on the floor. Repeat 3 times. Complete this exercise 2 times per day.  STRENGTH - Ankle Inversion Secure one end of a rubber  exercise band/tubing to a fixed object (table, pole). Loop the other end around your foot just before your toes. Place your fists between your knees. This will focus your strengthening at your ankle. Slowly, pull your big toe up and in, making sure the band/tubing is positioned to resist the entire motion. Hold this position for 10 seconds. Have your muscles resist the band/tubing as it slowly pulls your foot back to the starting position. Repeat 3 times. Complete this exercises 2 times per day.  Document Released: 03/29/2005 Document Revised: 06/21/2011 Document Reviewed: 07/11/2008 [facility] Patient Information 2014 [place], [place].

## 2020-11-29 ENCOUNTER — Other Ambulatory Visit: Payer: Self-pay | Admitting: Internal Medicine

## 2020-11-29 DIAGNOSIS — J3089 Other allergic rhinitis: Secondary | ICD-10-CM

## 2020-12-02 ENCOUNTER — Encounter: Payer: Self-pay | Admitting: Podiatry

## 2020-12-02 NOTE — Progress Notes (Signed)
  Subjective:  Patient ID: Katie Cook, female    DOB: Mar 26, 1956,  MRN: [id number]  Chief Complaint  Patient presents with   Diabetes    np// diabetic nail trim, foot evaluation, A1C  8.9    65 y.o. female presents with the above complaint. History confirmed with patient.  Nails are yellowed and discolored and thickened  Objective:  Physical Exam: warm, good capillary refill, no trophic changes or ulcerative lesions, normal DP and PT pulses, and abnormal sensory exam with loss of protective sensation. Left Foot: dystrophic yellowed discolored nail plates with subungual debris Right Foot: dystrophic yellowed discolored nail plates with subungual debris  Assessment:   1. Pain due to onychomycosis of toenails of both feet   2. Uncontrolled type 2 diabetes mellitus with hyperglycemia, with long-term current use of insulin (HCC)   3. Diabetic polyneuropathy associated with type 2 diabetes mellitus (HCC)      Plan:  Patient was evaluated and treated and all questions answered.  Patient educated on diabetes. Discussed proper diabetic foot care and discussed risks and complications of disease. Educated patient in depth on reasons to return to the office immediately should he/she discover anything concerning or new on the feet. All questions answered. Discussed proper shoes as well.    Discussed the etiology and treatment options for the condition in detail with the patient. Educated patient on the topical and oral treatment options for mycotic nails. Recommended debridement of the nails today. Sharp and mechanical debridement performed of all painful and mycotic nails today. Nails debrided in length and thickness using a nail nipper to level of comfort. Discussed treatment options including appropriate shoe gear. Follow up as needed for painful nails.  Rx for ciclopirox sent to pharmacy for topical treatment  Return in about 3 months (around 02/27/2021) for at risk diabetic foot  care.

## 2020-12-12 ENCOUNTER — Ambulatory Visit: Payer: Medicare HMO | Admitting: Internal Medicine

## 2020-12-23 ENCOUNTER — Telehealth: Payer: Self-pay

## 2020-12-23 NOTE — Telephone Encounter (Signed)
Patient came by the office on 12/23/2020 at 3:05pm to pick up medication

## 2020-12-23 NOTE — Telephone Encounter (Signed)
I called patient informed that pt assistance for tresiba (4 boxes) ready for pickup.

## 2021-01-07 NOTE — Progress Notes (Unsigned)
Things That May Be Affecting Your Health:  Alcohol  Hearing loss  Pain    Depression  Home Safety  Sexual Health   Diabetes  Lack of physical activity  Stress   Difficulty with daily activities  Loneliness  Tiredness   Drug use  Medicines  Tobacco use   Falls  Motor Vehicle Safety  Weight   Food choices  Oral Health  Other    YOUR PERSONALIZED HEALTH PLAN : 1. Schedule your next subsequent Medicare Wellness visit in one year 2. Attend all of your regular appointments to address your medical issues 3. Complete the preventative screenings and services   Annual Wellness Visit   Medicare Covered Preventative Screenings and [place] Men and Women Who How Often Need? Date of Last Service Action  Abdominal Aortic Aneurysm Adults with AAA risk factors Once      Alcohol Misuse and Counseling All Adults Screening once a year if no alcohol misuse. Counseling up to 4 face to face sessions.     Bone Density Measurement  Adults at risk for osteoporosis Once every 2 yrs      Lipid Panel Z13.6 All adults without CV disease Once every 5 yrs       Colorectal Cancer  Stool sample or Colonoscopy All adults 20 and older  Once every year Every 10 years        Depression All Adults Once a year  Today   Diabetes Screening Blood glucose, post glucose load, or GTT Z13.1 All adults at risk Pre-diabetics Once per year Twice per year      Diabetes  Self-Management Training All adults Diabetics 10 hrs first year; 2 hours subsequent years. Requires Copay     Glaucoma Diabetics Family history of glaucoma African Americans 37 yrs + Hispanic Americans 63 yrs + Annually - requires coppay      Hepatitis C Z72.89 or F19.20 High Risk for HCV Born between 1945 and 1965 Annually Once      HIV Z11.4 All adults based on risk Annually btw ages 16 & 46 regardless of risk Annually > 65 yrs if at increased risk      Lung Cancer Screening Asymptomatic adults aged 34-77 with 30 pack  yr history and current smoker OR quit within the last 15 yrs Annually Must have counseling and shared decision making documentation before first screen      Medical Nutrition Therapy Adults with  Diabetes Renal disease Kidney transplant within past 3 yrs 3 hours first year; 2 hours subsequent years     Obesity and Counseling All adults Screening once a year Counseling if BMI 30 or higher  Today   Tobacco Use Counseling Adults who use tobacco  Up to 8 visits in one year     Vaccines Z23 Hepatitis B Influenza  Pneumonia  Adults  Once Once every flu season Two different vaccines separated by one year     Next Annual Wellness Visit People with Medicare Every year  Today     Services & Screenings Women Who How Often Need  Date of Last Service Action  Mammogram  Z12.31 Women over 5 One baseline ages 38-39. Annually ager 40 yrs+      Pap tests All women Annually if high risk. Every 2 yrs for normal risk women      Screening for cervical cancer with  Pap (Z01.419 nl or Z01.411abnl) & HPV Z11.51 Women aged 20 to 51 Once every 5 yrs  Screening pelvic and breast exams All women Annually if high risk. Every 2 yrs for normal risk women     Sexually Transmitted Diseases Chlamydia Gonorrhea Syphilis All at risk adults Annually for non pregnant females at increased risk         Services & Screenings Men Who How Ofter Need  Date of Last Service Action  Prostate Cancer - DRE & PSA Men over 50 Annually.  DRE might require a copay.        Sexually Transmitted Diseases Syphilis All at risk adults Annually for men at increased risk      Health Maintenance List Health Maintenance  Topic Date Due   COVID-19 Vaccine (1) Never done   HIV Screening  Never done   Hepatitis C Screening  Never done   TETANUS/TDAP  Never done   Zoster Vaccines- Shingrix (1 of 2) Never done   COLONOSCOPY (Pts 45-72yrs Insurance coverage will need to be confirmed)  Never done   PAP SMEAR-Modifier   02/23/2016   DEXA SCAN  Never done   OPHTHALMOLOGY EXAM  10/08/2020   FOOT EXAM  10/10/2020   INFLUENZA VACCINE  Never done   HEMOGLOBIN A1C  12/14/2020   MAMMOGRAM  10/30/2021   HPV VACCINES  Aged Out

## 2021-02-16 ENCOUNTER — Ambulatory Visit: Payer: Medicare HMO | Admitting: Internal Medicine

## 2021-03-03 ENCOUNTER — Encounter: Payer: Self-pay | Admitting: Podiatry

## 2021-03-03 ENCOUNTER — Other Ambulatory Visit: Payer: Self-pay

## 2021-03-03 ENCOUNTER — Ambulatory Visit (INDEPENDENT_AMBULATORY_CARE_PROVIDER_SITE_OTHER): Payer: Medicare Other | Admitting: Podiatry

## 2021-03-03 DIAGNOSIS — B351 Tinea unguium: Secondary | ICD-10-CM

## 2021-03-03 DIAGNOSIS — E119 Type 2 diabetes mellitus without complications: Secondary | ICD-10-CM

## 2021-03-03 DIAGNOSIS — M79674 Pain in right toe(s): Secondary | ICD-10-CM

## 2021-03-03 DIAGNOSIS — E1142 Type 2 diabetes mellitus with diabetic polyneuropathy: Secondary | ICD-10-CM

## 2021-03-03 DIAGNOSIS — M79675 Pain in left toe(s): Secondary | ICD-10-CM | POA: Diagnosis not present

## 2021-03-03 NOTE — Progress Notes (Signed)
ANNUAL DIABETIC FOOT EXAM  Subjective: Katie Cook presents today for for annual diabetic foot examination, at risk foot care with history of diabetic neuropathy, and painful elongated mycotic toenails 1-5 bilaterally which are tender when wearing enclosed shoe gear. Pain is relieved with periodic professional debridement..  Patient relates 10 year h/o diabetes.  Patient denies any h/o foot wounds.  Patient endorses symptoms of foot numbness.  Patient endorses symptoms of foot tingling.  Patient does not remember blood glucose reading today  Katie Holms, Katie Cook is patient's PCP. Last visit was two weeks ago.  Past Medical History:  Diagnosis Date   Diabetes mellitus without complication ([place])    Hypertension    Thyroid disease    Uncontrolled diabetes mellitus type 2 without complications 1/97/5883   Patient Active Problem List   Diagnosis Date Noted   Class 3 severe obesity with serious comorbidity and body mass index (BMI) of 40.0 to 44.9 in adult [facility]) 06/05/2020   Major depressive disorder, single episode, moderate ([place]) 06/05/2020   Routine adult health maintenance 06/05/2020   Diabetic retinopathy ([place]) 06/05/2020   Numbness of toes 03/04/2020   Shortness of breath 03/04/2020   Hypertension 09/17/2019   Asthma 09/17/2019   Benign paroxysmal positional vertigo of right ear 05/25/2017   Sensorineural hearing loss (SNHL) of both ears 05/25/2017   Temporomandibular joint disorder 05/25/2017   Mild cognitive impairment 01/05/2016   Post-surgical hypothyroidism 07/26/2013   Uncontrolled type 2 diabetes mellitus with hyperglycemia, with long-term current use of insulin ([place]) 07/26/2013   Past Surgical History:  Procedure Laterality Date   CESAREAN SECTION     THYROID SURGERY     Current Outpatient Medications on File Prior to Visit  Medication Sig Dispense Refill   BINAXNOW COVID-19 AG HOME TEST KIT See admin instructions.     Blood Glucose Monitoring Suppl  (ACCU-CHEK GUIDE ME) w/Device KIT Use as instructed to check blood sugar 3-4 times a day 1 kit 0   carvedilol (COREG) 25 MG tablet Take 1 tablet (25 mg total) by mouth 2 (two) times daily with a meal. 180 tablet 3   ciclopirox (PENLAC) 8 % solution Apply topically at bedtime. Apply over nail and surrounding skin. Apply daily over previous coat. After seven (7) days, may remove with alcohol and continue cycle. 6.6 mL 3   cloNIDine (CATAPRES) 0.1 MG tablet Take 0.1 mg by mouth at bedtime.     Dulaglutide (TRULICITY) 3 GP/4.9IY SOPN Inject 3 mg as directed once a week. (Patient not taking: Reported on 06/13/2020) 2.5 mL 3   furosemide (LASIX) 40 MG tablet Take by mouth.     glucose blood (ACCU-CHEK GUIDE) test strip Use as instructed to check blood sugar 3-4 times a day 100 each 12   hydrALAZINE (APRESOLINE) 25 MG tablet      hydrochlorothiazide (HYDRODIURIL) 25 MG tablet Take 1 tablet (25 mg total) by mouth daily. 90 tablet 3   insulin degludec (TRESIBA FLEXTOUCH) 200 UNIT/ML FlexTouch Pen Inject 50 Units into the skin daily. 3 mL 0   Insulin Lispro (HUMALOG KWIKPEN) 200 UNIT/ML SOPN Inject 25-30 Units into the skin 3 (three) times daily before meals. 30 pen 3   Insulin Pen Needle 32G X 4 MM MISC Use 4 times a day with insulin pen. 400 each 1   Lancets (ONETOUCH ULTRASOFT) lancets Use to test blood sugar 3 times daily as instructed. Dx: E11.65 300 each 3   levocetirizine (XYZAL) 5 MG tablet TAKE 1 TABLET EVERY DAY IN  THE EVENING 90 tablet 3   levothyroxine (EUTHYROX) 150 MCG tablet Take 1 tablet (150 mcg total) by mouth daily before breakfast. 90 tablet 3   levothyroxine (SYNTHROID) 175 MCG tablet Take 175 mcg by mouth daily.     losartan (COZAAR) 100 MG tablet Take 1 tablet (100 mg total) by mouth daily. 90 tablet 5   montelukast (SINGULAIR) 10 MG tablet Take 10 mg by mouth daily.     nystatin cream (MYCOSTATIN) APPLY 1 APPLICATION TOPICALLY TWICE DAILY 60 g 1   omeprazole (PRILOSEC) 20 MG capsule  Take 1 capsule (20 mg total) by mouth 2 (two) times daily. 180 capsule 3   No current facility-administered medications on file prior to visit.    Allergies  Allergen Reactions   Trulicity [Dulaglutide] Nausea And Vomiting   Metformin And Related Diarrhea    and dizziness   Amlodipine Swelling   Ceclor [Cefaclor] Rash   Sulfa Antibiotics Rash   Social History   Occupational History   Not on file  Tobacco Use   Smoking status: Never   Smokeless tobacco: Never  Vaping Use   Vaping Use: Never used  Substance and Sexual Activity   Alcohol use: No    Alcohol/week: 0.0 standard drinks   Drug use: No   Sexual activity: Not Currently   Family History  Problem Relation Age of Onset   Ovarian cancer Mother    Cancer Mother    Cancer Father        pancreatic    Stroke Father    Diabetes Sister    Cancer Maternal Aunt        breast cancer    Diabetes Maternal Grandmother    Cancer Maternal Aunt        thyroid    Hypertension Sister    Stroke Paternal Uncle     There is no immunization history on file for this patient.   Review of Systems: Negative except as noted in the HPI.   Objective: There were no vitals filed for this visit.  Katie Cook is a pleasant 65 y.o. female in NAD. AAO X 3.  Vascular Examination: CFT immediate b/l LE. Palpable DP/PT pulses b/l LE. Digital hair present b/l. Skin temperature gradient WNL b/l. No pain with calf compression b/l. No edema noted b/l. No cyanosis or clubbing noted b/l LE.  Dermatological Examination: Pedal integument with normal turgor, texture and tone b/l LE. No open wounds b/l. No interdigital macerations b/l. Toenails 1-5 b/l elongated, thickened, discolored with subungual debris. +Tenderness with dorsal palpation of nailplates. No hyperkeratotic or porokeratotic lesions present.  Musculoskeletal Examination: Normal muscle strength 5/5 to all lower extremity muscle groups bilaterally. No pain, crepitus or joint  limitation noted with ROM b/l LE. No gross bony pedal deformities b/l. Patient ambulates independently without assistive aids.  Footwear Assessment: Does the patient wear appropriate shoes? Yes. Does the patient need inserts/orthotics? No.  Neurological Examination: Pt has subjective symptoms of neuropathy. Protective sensation diminished with 10g monofilament b/l. Vibratory sensation diminished b/l. Proprioception intact bilaterally.  Hemoglobin A1C Latest Ref Rng & Units 06/13/2020  HGBA1C 4.0 - 5.6 % 8.9(A)  Some recent data might be hidden   Assessment: 1. Pain due to onychomycosis of toenails of both feet   2. Diabetic polyneuropathy associated with type 2 diabetes mellitus ([place])   3. Encounter for diabetic foot exam ([place])     ADA Risk Categorization: High Risk  Patient has one or more of the following: Loss of  protective sensation Absent pedal pulses Severe Foot deformity History of foot ulcer  Plan: -Diabetic foot examination performed today. -Continue foot and shoe inspections daily. Monitor blood glucose per PCP/Endocrinologist's recommendations. -Mycotic toenails 1-5 bilaterally were debrided in length and girth with sterile nail nippers and dremel without incident. -Patient/POA to call should there be question/concern in the interim.  Return in about 3 months (around 06/03/2021).  Marzetta Board, DPM

## 2021-03-19 ENCOUNTER — Encounter: Payer: Medicare HMO | Admitting: Internal Medicine

## 2021-03-26 ENCOUNTER — Ambulatory Visit: Payer: Self-pay | Admitting: Internal Medicine

## 2021-05-01 ENCOUNTER — Telehealth: Payer: Self-pay

## 2021-05-01 NOTE — Telephone Encounter (Signed)
Called and left a message for pt advising patient assistance is ready for pick up. 1 box of [place]

## 2021-06-12 ENCOUNTER — Ambulatory Visit: Payer: Medicare Other | Admitting: Podiatry

## 2021-08-14 NOTE — Telephone Encounter (Signed)
Patient picked up 1 box Tresiba from patient assistance - chart and  ?

## 2021-10-26 ENCOUNTER — Emergency Department (HOSPITAL_COMMUNITY)
Admission: EM | Admit: 2021-10-26 | Discharge: 2021-10-26 | Disposition: A | Discharge status: Home / Self Care | Payer: Medicare Other | Attending: Emergency Medicine | Admitting: Emergency Medicine

## 2021-10-26 ENCOUNTER — Encounter (HOSPITAL_COMMUNITY): Payer: Self-pay | Admitting: Emergency Medicine

## 2021-10-26 ENCOUNTER — Other Ambulatory Visit: Payer: Self-pay

## 2021-10-26 DIAGNOSIS — Y9389 Activity, other specified: Secondary | ICD-10-CM | POA: Diagnosis not present

## 2021-10-26 DIAGNOSIS — Z794 Long term (current) use of insulin: Secondary | ICD-10-CM | POA: Diagnosis not present

## 2021-10-26 DIAGNOSIS — S0502XA Injury of conjunctiva and corneal abrasion without foreign body, left eye, initial encounter: Secondary | ICD-10-CM

## 2021-10-26 DIAGNOSIS — I10 Essential (primary) hypertension: Secondary | ICD-10-CM | POA: Diagnosis not present

## 2021-10-26 DIAGNOSIS — J45909 Unspecified asthma, uncomplicated: Secondary | ICD-10-CM | POA: Insufficient documentation

## 2021-10-26 DIAGNOSIS — S0592XA Unspecified injury of left eye and orbit, initial encounter: Secondary | ICD-10-CM | POA: Diagnosis present

## 2021-10-26 DIAGNOSIS — W500XXA Accidental hit or strike by another person, initial encounter: Secondary | ICD-10-CM | POA: Insufficient documentation

## 2021-10-26 DIAGNOSIS — E119 Type 2 diabetes mellitus without complications: Secondary | ICD-10-CM | POA: Diagnosis not present

## 2021-10-26 LAB — CBG MONITORING, ED: Glucose-Capillary: 285 mg/dL — ABNORMAL HIGH (ref 70–99)

## 2021-10-26 MED ORDER — TETRACAINE HCL 0.5 % OP SOLN
1.0000 [drp] | Freq: Once | OPHTHALMIC | Status: AC
  Administered 2021-10-26: 1 [drp] via OPHTHALMIC
  Filled 2021-10-26 (×2): qty 4

## 2021-10-26 MED ORDER — ERYTHROMYCIN 5 MG/GM OP OINT
1.0000 | TOPICAL_OINTMENT | Freq: Four times a day (QID) | OPHTHALMIC | Status: DC
  Administered 2021-10-26: 1 via OPHTHALMIC
  Filled 2021-10-26: qty 3.5

## 2021-10-26 MED ORDER — TETRACAINE HCL 0.5 % OP SOLN
1.0000 [drp] | Freq: Once | OPHTHALMIC | Status: AC
  Administered 2021-10-26: 1 [drp] via OPHTHALMIC
  Filled 2021-10-26: qty 4

## 2021-10-26 MED ORDER — OXYCODONE-ACETAMINOPHEN 5-325 MG PO TABS
1.0000 | ORAL_TABLET | Freq: Once | ORAL | Status: DC
  Filled 2021-10-26: qty 1

## 2021-10-26 MED ORDER — OXYCODONE-ACETAMINOPHEN 5-325 MG PO TABS
1.0000 | ORAL_TABLET | Freq: Once | ORAL | Status: AC
  Administered 2021-10-26: 1 via ORAL
  Filled 2021-10-26: qty 1

## 2021-10-26 MED ORDER — FLUORESCEIN SODIUM 1 MG OP STRP
1.0000 | ORAL_STRIP | Freq: Once | OPHTHALMIC | Status: AC
  Administered 2021-10-26: 1 via OPHTHALMIC
  Filled 2021-10-26: qty 1

## 2021-10-26 MED ORDER — ONDANSETRON 4 MG PO TBDP
4.0000 mg | ORAL_TABLET | Freq: Once | ORAL | Status: AC
  Administered 2021-10-26: 4 mg via ORAL
  Filled 2021-10-26: qty 1
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
## 2021-10-26 NOTE — ED Notes (Signed)
Paged on call ophthalmologist at [facility]

## 2021-11-18 ENCOUNTER — Encounter (INDEPENDENT_AMBULATORY_CARE_PROVIDER_SITE_OTHER): Payer: Self-pay

## 2022-06-04 ENCOUNTER — Other Ambulatory Visit (HOSPITAL_BASED_OUTPATIENT_CLINIC_OR_DEPARTMENT_OTHER): Payer: Self-pay | Admitting: Registered Nurse

## 2022-06-04 ENCOUNTER — Encounter (HOSPITAL_BASED_OUTPATIENT_CLINIC_OR_DEPARTMENT_OTHER): Payer: Self-pay | Admitting: Registered Nurse

## 2022-06-04 ENCOUNTER — Ambulatory Visit (HOSPITAL_BASED_OUTPATIENT_CLINIC_OR_DEPARTMENT_OTHER)
Admission: RE | Admit: 2022-06-04 | Discharge: 2022-06-04 | Disposition: A | Discharge status: Home / Self Care | Payer: Medicare (Managed Care) | Source: Ambulatory Visit | Attending: Registered Nurse | Admitting: Registered Nurse

## 2022-06-04 DIAGNOSIS — M549 Dorsalgia, unspecified: Secondary | ICD-10-CM | POA: Insufficient documentation

## 2023-01-28 DIAGNOSIS — G720 Drug-induced myopathy: Secondary | ICD-10-CM | POA: Insufficient documentation

## 2023-02-21 ENCOUNTER — Emergency Department (HOSPITAL_COMMUNITY)
Admission: EM | Admit: 2023-02-21 | Discharge: 2023-02-21 | Disposition: A | Discharge status: Home / Self Care | Payer: Medicare (Managed Care) | Attending: Emergency Medicine | Admitting: Emergency Medicine

## 2023-02-21 ENCOUNTER — Other Ambulatory Visit: Payer: Self-pay

## 2023-02-21 ENCOUNTER — Emergency Department (HOSPITAL_COMMUNITY): Payer: Medicare (Managed Care)

## 2023-02-21 DIAGNOSIS — I10 Essential (primary) hypertension: Secondary | ICD-10-CM | POA: Diagnosis not present

## 2023-02-21 DIAGNOSIS — R0789 Other chest pain: Secondary | ICD-10-CM | POA: Insufficient documentation

## 2023-02-21 DIAGNOSIS — R0602 Shortness of breath: Secondary | ICD-10-CM | POA: Insufficient documentation

## 2023-02-21 DIAGNOSIS — E119 Type 2 diabetes mellitus without complications: Secondary | ICD-10-CM | POA: Insufficient documentation

## 2023-02-21 DIAGNOSIS — Z79899 Other long term (current) drug therapy: Secondary | ICD-10-CM | POA: Diagnosis not present

## 2023-02-21 DIAGNOSIS — Z794 Long term (current) use of insulin: Secondary | ICD-10-CM | POA: Insufficient documentation

## 2023-02-21 LAB — CBC
HCT: 39.9 % (ref 36.0–46.0)
Hemoglobin: 13.1 g/dL (ref 12.0–15.0)
MCH: 27.4 pg (ref 26.0–34.0)
MCHC: 32.8 g/dL (ref 30.0–36.0)
MCV: 83.5 fL (ref 80.0–100.0)
Platelets: 263 10*3/uL (ref 150–400)
RBC: 4.78 MIL/uL (ref 3.87–5.11)
RDW: 14.5 % (ref 11.5–15.5)
WBC: 9.1 10*3/uL (ref 4.0–10.5)
nRBC: 0 % (ref 0.0–0.2)

## 2023-02-21 LAB — TROPONIN I (HIGH SENSITIVITY)
Troponin I (High Sensitivity): 15 ng/L (ref <18)
Troponin I (High Sensitivity): 11 ng/L (ref <18)

## 2023-02-21 LAB — BASIC METABOLIC PANEL
Anion gap: 8 (ref 5–15)
BUN: 14 mg/dL (ref 8–23)
CO2: 23 mmol/L (ref 22–32)
Calcium: 8.8 mg/dL — ABNORMAL LOW (ref 8.9–10.3)
Chloride: 105 mmol/L (ref 98–111)
Creatinine, Ser: 0.46 mg/dL (ref 0.44–1.00)
GFR, Estimated: >60 mL/min (ref >60)
Glucose, Bld: 170 mg/dL — ABNORMAL HIGH (ref 70–99)
Potassium: 3.8 mmol/L (ref 3.5–5.1)
Sodium: 136 mmol/L (ref 135–145)

## 2023-02-21 LAB — D-DIMER, QUANTITATIVE: D-Dimer, Quant: 0.53 ug{FEU}/mL — ABNORMAL HIGH (ref 0.00–0.50)

## 2023-02-21 MED ORDER — ACETAMINOPHEN 500 MG PO TABS
1000.0000 mg | ORAL_TABLET | Freq: Once | ORAL | Status: AC
  Administered 2023-02-21: 1000 mg via ORAL
  Filled 2023-02-21: qty 2

## 2023-02-21 NOTE — ED Notes (Signed)
Pt and daughter now reporting pt fell off a chair a couple days ago, and this might be cause of chest pain.

## 2023-02-21 NOTE — ED Provider Notes (Addendum)
[place] EMERGENCY DEPARTMENT AT [facility] Provider Note   CSN: [id number] Arrival date & time: 02/21/23  4098     History  Chief Complaint  Patient presents with   Chest Pain    Katie Cook is a 67 y.o. female.  Patient has had some mid substernal chest pain worse with taking a deep breath and is difficult to take a deep breath.  This started yesterday about 3 days ago she did fall out of a chair did not think she hurt anything.  Patient is oxygen saturations here on arrival were 99% on room air.  Patient denies any nausea or vomiting.  No cough no respiratory symptoms.  Temp was 97.8.  Patient has no known cardiac history however she does have a history of hypertension diabetes thyroid disease.  Patient is never used tobacco products.  The chest discomfort is kind of upper mid sternum is kind of soreness.  Nonradiating.       Home Medications Prior to Admission medications   Medication Sig Start Date End Date Taking? Authorizing Provider  BINAXNOW COVID-19 AG HOME TEST KIT See admin instructions. 11/30/20   [provider]  Blood Glucose Monitoring Suppl (ACCU-CHEK GUIDE ME) w/Device KIT Use as instructed to check blood sugar 3-4 times a day 08/11/20   Carlus Pavlov, MD  carvedilol (COREG) 25 MG tablet Take 1 tablet (25 mg total) by mouth 2 (two) times daily with a meal. 06/12/20   Miguel Aschoff, MD  ciclopirox [facility]) 8 % solution Apply topically at bedtime. Apply over nail and surrounding skin. Apply daily over previous coat. After seven (7) days, may remove with alcohol and continue cycle. 11/27/20   McDonald, Rachelle Hora, DPM  cloNIDine (CATAPRES) 0.1 MG tablet Take 0.1 mg by mouth at bedtime. 02/14/21   [provider]  Dulaglutide (TRULICITY) 3 MG/0.5ML SOPN Inject 3 mg as directed once a week. Patient not taking: Reported on 06/13/2020 03/04/20   Carlus Pavlov, MD  furosemide (LASIX) 40 MG tablet Take by mouth. 12/31/20   [provider]  glucose blood (ACCU-CHEK GUIDE) test strip Use as instructed to check blood sugar 3-4 times a day 08/11/20   Carlus Pavlov, MD  hydrALAZINE (APRESOLINE) 25 MG tablet  06/20/20   [provider]  hydrochlorothiazide (HYDRODIURIL) 25 MG tablet Take 1 tablet (25 mg total) by mouth daily. 06/12/20 09/10/20  Miguel Aschoff, MD  insulin degludec (TRESIBA FLEXTOUCH) 200 UNIT/ML FlexTouch Pen Inject 50 Units into the skin daily. 01/15/20   Carlus Pavlov, MD  Insulin Lispro (HUMALOG KWIKPEN) 200 UNIT/ML SOPN Inject 25-30 Units into the skin 3 (three) times daily before meals. 11/07/18   Reather Littler, MD  Insulin Pen Needle 32G X 4 MM MISC Use 4 times a day with insulin pen. 01/15/20   Carlus Pavlov, MD  Lancets [facility] ULTRASOFT) lancets Use to test blood sugar 3 times daily as instructed. Dx: E11.65 08/19/14   Carlus Pavlov, MD  levocetirizine (XYZAL) 5 MG tablet TAKE 1 TABLET EVERY DAY IN THE EVENING 12/01/20   Miguel Aschoff, MD  levothyroxine [facility]) 150 MCG tablet Take 1 tablet (150 mcg total) by mouth daily before breakfast. 06/11/20   Miguel Aschoff, MD  levothyroxine (SYNTHROID) 175 MCG tablet Take 175 mcg by mouth daily. 12/01/20   [provider]  losartan (COZAAR) 100 MG tablet Take 1 tablet (100 mg total) by mouth daily. 06/12/20   Miguel Aschoff, MD  montelukast (SINGULAIR) 10 MG  tablet Take 10 mg by mouth daily. 02/14/21   [provider]  nystatin cream (MYCOSTATIN) APPLY 1 APPLICATION TOPICALLY TWICE DAILY 12/01/20   Carlus Pavlov, MD  omeprazole (PRILOSEC) 20 MG capsule Take 1 capsule (20 mg total) by mouth 2 (two) times daily. 06/11/20   Miguel Aschoff, MD      Allergies    Trulicity [dulaglutide], Metformin and related, Amlodipine, Ceclor [cefaclor], and Sulfa antibiotics    Review of Systems   Review of Systems  Constitutional:  Negative for chills and fever.  HENT:  Negative for ear pain and sore throat.    Eyes:  Negative for pain and visual disturbance.  Respiratory:  Negative for cough and shortness of breath.   Cardiovascular:  Positive for chest pain. Negative for palpitations and leg swelling.  Gastrointestinal:  Negative for abdominal pain and vomiting.  Genitourinary:  Negative for dysuria and hematuria.  Musculoskeletal:  Negative for arthralgias and back pain.  Skin:  Negative for color change and rash.  Neurological:  Negative for seizures and syncope.  All other systems reviewed and are negative.   Physical Exam Updated Vital Signs BP (!) 196/80   Pulse 70   Temp 97.8 F (36.6 C)   Resp 15   Ht 1.549 m (5\' 1" )   Wt 97.5 kg   SpO2 100%   BMI 40.62 kg/m  Physical Exam Vitals and nursing note reviewed.  Constitutional:      General: She is not in acute distress.    Appearance: Normal appearance. She is well-developed.  HENT:     Head: Normocephalic and atraumatic.  Eyes:     Extraocular Movements: Extraocular movements intact.     Conjunctiva/sclera: Conjunctivae normal.     Pupils: Pupils are equal, round, and reactive to light.  Cardiovascular:     Rate and Rhythm: Normal rate and regular rhythm.     Heart sounds: No murmur heard. Pulmonary:     Effort: Pulmonary effort is normal. No respiratory distress.     Breath sounds: Normal breath sounds.  Chest:     Chest wall: No tenderness.  Abdominal:     Palpations: Abdomen is soft.     Tenderness: There is no abdominal tenderness.  Musculoskeletal:        General: No swelling.     Cervical back: Neck supple.     Right lower leg: No edema.     Left lower leg: No edema.  Skin:    General: Skin is warm and dry.     Capillary Refill: Capillary refill takes less than 2 seconds.  Neurological:     General: No focal deficit present.     Mental Status: She is alert and oriented to person, place, and time.  Psychiatric:        Mood and Affect: Mood normal.     ED Results / Procedures / Treatments    Labs (all labs ordered are listed, but only abnormal results are displayed) Labs Reviewed  BASIC METABOLIC PANEL - Abnormal; Notable for the following components:      Result Value   Glucose, Bld 170 (*)    Calcium 8.8 (*)    All other components within normal limits  CBC  D-DIMER, QUANTITATIVE  TROPONIN I (HIGH SENSITIVITY)  TROPONIN I (HIGH SENSITIVITY)    EKG EKG Interpretation Date/Time:  Monday February 21 2023 06:32:19 EST Ventricular Rate:  66 PR Interval:  174 QRS Duration:  90 QT Interval:  428 QTC Calculation: 448 R  Axis:   46  Text Interpretation: Normal sinus rhythm with sinus arrhythmia Nonspecific T wave abnormality Abnormal ECG When compared with ECG of 17-Mar-2020 16:44, PREVIOUS ECG IS PRESENT Confirmed by Vanetta Mulders [phone]) on 02/21/2023 8:40:38 AM  Radiology DG Chest 2 View  Result Date: 02/21/2023 CLINICAL DATA:  67 year old female with history of chest pain for the past 2 days. EXAM: CHEST - 2 VIEW COMPARISON:  Chest x-ray 01/20/2015. FINDINGS: Lung volumes are normal. No consolidative airspace disease. No pleural effusions. No pneumothorax. No pulmonary nodule or mass noted. Pulmonary vasculature and the cardiomediastinal silhouette are within normal limits. IMPRESSION: No radiographic evidence of acute cardiopulmonary disease. Electronically Signed   By: Trudie Reed M.D.   On: 02/21/2023 07:40    Procedures Procedures    Medications Ordered in ED Medications - No data to display  ED Course/ Medical Decision Making/ A&P                                 Medical Decision Making Amount and/or Complexity of Data Reviewed Labs: ordered. Radiology: ordered.   EKG had some nonspecific T wave changes compared to her old EKG.  Initial troponin was 15 delta troponin pending.  Patient's basic metabolic panel blood sugar 170 renal functions normal.  CBC no leukocytosis hemoglobin 13.1.  Chest x-ray was negative.  Although has no hypoxia  oxygen sats are up based on the fact that she feels as if it is difficult to get a good deep breath.  Will go ahead and get a D-dimer just to rule out the possibility of pulmonary embolus.  And patient's delta troponin is pending.  Patient's delta troponin is 11.  So no significant change there.  Patient's D-dimer was 0.53 but for age adjustment that is normal.  Patient should be stable for discharge home and follow-up with cardiology.   Final Clinical Impression(s) / ED Diagnoses Final diagnoses:  Atypical chest pain  SOB (shortness of breath)    Rx / DC Orders ED Discharge Orders     None         Vanetta Mulders, MD 02/21/23 6213    Vanetta Mulders, MD 02/21/23 1138    Vanetta Mulders, MD 02/21/23 1329

## 2023-02-21 NOTE — Discharge Instructions (Signed)
Make an appointment to follow-up with your regular doctor as well as cardiology.  Recommend taking extra strength Tylenol 2 tablets every 8 hours.  Workup here today without evidence of any significant cardiac event.  Chest x-ray clear and screening test for blood clots negative.  Return for any new or worse symptoms.  Suspect that this is probably musculoskeletal chest pain.  That is the reason for taking extra strength Tylenol.

## 2023-02-21 NOTE — ED Triage Notes (Signed)
Pt arrives to ED c/o substernal non radiating CP x 2 days. Pain is worse on exertion. Pt endorses SOB. Pt with hx of hypertension. Pt reports nausea but denies vomiting

## 2023-03-23 ENCOUNTER — Other Ambulatory Visit: Payer: Self-pay

## 2023-03-23 ENCOUNTER — Emergency Department (HOSPITAL_COMMUNITY): Payer: Medicare (Managed Care)

## 2023-03-23 ENCOUNTER — Encounter (HOSPITAL_COMMUNITY): Payer: Self-pay

## 2023-03-23 ENCOUNTER — Observation Stay (HOSPITAL_COMMUNITY)
Admission: EM | Admit: 2023-03-23 | Discharge: 2023-03-24 | Disposition: A | Discharge status: Home / Self Care | Payer: Medicare (Managed Care)

## 2023-03-23 DIAGNOSIS — E1165 Type 2 diabetes mellitus with hyperglycemia: Secondary | ICD-10-CM

## 2023-03-23 DIAGNOSIS — E039 Hypothyroidism, unspecified: Secondary | ICD-10-CM | POA: Diagnosis not present

## 2023-03-23 DIAGNOSIS — I10 Essential (primary) hypertension: Secondary | ICD-10-CM | POA: Diagnosis present

## 2023-03-23 DIAGNOSIS — G459 Transient cerebral ischemic attack, unspecified: Secondary | ICD-10-CM | POA: Insufficient documentation

## 2023-03-23 DIAGNOSIS — Z794 Long term (current) use of insulin: Secondary | ICD-10-CM | POA: Insufficient documentation

## 2023-03-23 DIAGNOSIS — I5189 Other ill-defined heart diseases: Secondary | ICD-10-CM

## 2023-03-23 DIAGNOSIS — Z79899 Other long term (current) drug therapy: Secondary | ICD-10-CM | POA: Insufficient documentation

## 2023-03-23 DIAGNOSIS — I160 Hypertensive urgency: Principal | ICD-10-CM | POA: Insufficient documentation

## 2023-03-23 DIAGNOSIS — E66813 Obesity, class 3: Secondary | ICD-10-CM

## 2023-03-23 DIAGNOSIS — H539 Unspecified visual disturbance: Secondary | ICD-10-CM

## 2023-03-23 DIAGNOSIS — H532 Diplopia: Secondary | ICD-10-CM | POA: Insufficient documentation

## 2023-03-23 DIAGNOSIS — I161 Hypertensive emergency: Secondary | ICD-10-CM | POA: Diagnosis not present

## 2023-03-23 DIAGNOSIS — J45909 Unspecified asthma, uncomplicated: Secondary | ICD-10-CM | POA: Insufficient documentation

## 2023-03-23 LAB — CBC
HCT: 39.8 % (ref 36.0–46.0)
Hemoglobin: 13 g/dL (ref 12.0–15.0)
MCH: 27.4 pg (ref 26.0–34.0)
MCHC: 32.7 g/dL (ref 30.0–36.0)
MCV: 84 fL (ref 80.0–100.0)
Platelets: 254 10*3/uL (ref 150–400)
RBC: 4.74 MIL/uL (ref 3.87–5.11)
RDW: 14.6 % (ref 11.5–15.5)
WBC: 8.8 10*3/uL (ref 4.0–10.5)
nRBC: 0 % (ref 0.0–0.2)

## 2023-03-23 LAB — BASIC METABOLIC PANEL
Anion gap: 8 (ref 5–15)
BUN: 23 mg/dL (ref 8–23)
CO2: 24 mmol/L (ref 22–32)
Calcium: 9.1 mg/dL (ref 8.9–10.3)
Chloride: 103 mmol/L (ref 98–111)
Creatinine, Ser: 0.52 mg/dL (ref 0.44–1.00)
GFR, Estimated: >60 mL/min (ref >60)
Glucose, Bld: 176 mg/dL — ABNORMAL HIGH (ref 70–99)
Potassium: 3.6 mmol/L (ref 3.5–5.1)
Sodium: 135 mmol/L (ref 135–145)

## 2023-03-23 LAB — MAGNESIUM: Magnesium: 2.2 mg/dL (ref 1.7–2.4)

## 2023-03-23 LAB — HEPATIC FUNCTION PANEL
ALT: 21 U/L (ref 0–44)
AST: 19 U/L (ref 15–41)
Albumin: 4 g/dL (ref 3.5–5.0)
Alkaline Phosphatase: 101 U/L (ref 38–126)
Bilirubin, Direct: 0.1 mg/dL (ref 0.0–0.2)
Indirect Bilirubin: 0.3 mg/dL (ref 0.3–0.9)
Total Bilirubin: 0.4 mg/dL (ref <1.2)
Total Protein: 7.4 g/dL (ref 6.5–8.1)

## 2023-03-23 LAB — CBG MONITORING, ED: Glucose-Capillary: 209 mg/dL — ABNORMAL HIGH (ref 70–99)

## 2023-03-23 LAB — GLUCOSE, CAPILLARY
Glucose-Capillary: 227 mg/dL — ABNORMAL HIGH (ref 70–99)
Glucose-Capillary: 308 mg/dL — ABNORMAL HIGH (ref 70–99)

## 2023-03-23 LAB — TROPONIN I (HIGH SENSITIVITY)
Troponin I (High Sensitivity): 9 ng/L (ref <18)
Troponin I (High Sensitivity): 9 ng/L (ref <18)

## 2023-03-23 LAB — PHOSPHORUS: Phosphorus: 3.9 mg/dL (ref 2.5–4.6)

## 2023-03-23 MED ORDER — FUROSEMIDE 40 MG PO TABS
40.0000 mg | ORAL_TABLET | Freq: Every day | ORAL | Status: DC
  Administered 2023-03-24: 40 mg via ORAL
  Filled 2023-03-23: qty 1

## 2023-03-23 MED ORDER — FUROSEMIDE 10 MG/ML IJ SOLN
40.0000 mg | Freq: Once | INTRAMUSCULAR | Status: AC
  Administered 2023-03-23: 40 mg via INTRAVENOUS
  Filled 2023-03-23: qty 4

## 2023-03-23 MED ORDER — LEVOTHYROXINE SODIUM 25 MCG PO TABS
125.0000 ug | ORAL_TABLET | Freq: Every day | ORAL | Status: DC
  Administered 2023-03-24: 125 ug via ORAL
  Filled 2023-03-23: qty 1

## 2023-03-23 MED ORDER — ONDANSETRON HCL 4 MG PO TABS: 4.0000 mg | ORAL_TABLET | Freq: Four times a day (QID) | ORAL | Status: DC | PRN

## 2023-03-23 MED ORDER — HYDRALAZINE HCL 20 MG/ML IJ SOLN
20.0000 mg | Freq: Once | INTRAMUSCULAR | Status: AC
  Administered 2023-03-23: 20 mg via INTRAVENOUS
  Filled 2023-03-23: qty 1

## 2023-03-23 MED ORDER — LABETALOL HCL 5 MG/ML IV SOLN
10.0000 mg | Freq: Once | INTRAVENOUS | Status: AC
  Administered 2023-03-23: 10 mg via INTRAVENOUS
  Filled 2023-03-23: qty 4

## 2023-03-23 MED ORDER — OXYCODONE HCL 5 MG PO TABS: 5.0000 mg | ORAL_TABLET | ORAL | Status: DC | PRN

## 2023-03-23 MED ORDER — INSULIN ASPART 100 UNIT/ML IJ SOLN
0.0000 [IU] | Freq: Three times a day (TID) | INTRAMUSCULAR | Status: DC
  Administered 2023-03-23 – 2023-03-24 (×4): 7 [IU] via SUBCUTANEOUS
  Filled 2023-03-23: qty 0.2

## 2023-03-23 MED ORDER — INSULIN GLARGINE-YFGN 100 UNIT/ML ~~LOC~~ SOLN
30.0000 [IU] | Freq: Every day | SUBCUTANEOUS | Status: DC
  Administered 2023-03-23: 30 [IU] via SUBCUTANEOUS
  Filled 2023-03-23 (×2): qty 0.3

## 2023-03-23 MED ORDER — ACETAMINOPHEN 325 MG PO TABS
650.0000 mg | ORAL_TABLET | Freq: Four times a day (QID) | ORAL | Status: DC | PRN
  Administered 2023-03-24: 650 mg via ORAL
  Filled 2023-03-23: qty 2

## 2023-03-23 MED ORDER — LOSARTAN POTASSIUM 25 MG PO TABS
100.0000 mg | ORAL_TABLET | Freq: Every day | ORAL | Status: DC
  Administered 2023-03-24: 100 mg via ORAL
  Filled 2023-03-23: qty 4

## 2023-03-23 MED ORDER — ONDANSETRON HCL 4 MG/2ML IJ SOLN: 4.0000 mg | Freq: Four times a day (QID) | INTRAMUSCULAR | Status: DC | PRN

## 2023-03-23 MED ORDER — CARVEDILOL 12.5 MG PO TABS
25.0000 mg | ORAL_TABLET | Freq: Two times a day (BID) | ORAL | Status: DC
  Administered 2023-03-23 – 2023-03-24 (×2): 25 mg via ORAL
  Filled 2023-03-23 (×2): qty 2

## 2023-03-23 MED ORDER — KETOROLAC TROMETHAMINE 15 MG/ML IJ SOLN
15.0000 mg | Freq: Once | INTRAMUSCULAR | Status: AC
  Administered 2023-03-23: 15 mg via INTRAVENOUS
  Filled 2023-03-23: qty 1

## 2023-03-23 MED ORDER — IOHEXOL 350 MG/ML SOLN
80.0000 mL | Freq: Once | INTRAVENOUS | Status: AC | PRN
  Administered 2023-03-23: 75 mL via INTRAVENOUS

## 2023-03-23 MED ORDER — HYDRALAZINE HCL 20 MG/ML IJ SOLN: 20.0000 mg | INTRAMUSCULAR | Status: DC | PRN

## 2023-03-23 MED ORDER — SODIUM CHLORIDE (PF) 0.9 % IJ SOLN
INTRAMUSCULAR | Status: AC
Start: 2023-03-23 — End: ?
  Filled 2023-03-23: qty 50

## 2023-03-23 MED ORDER — POTASSIUM CHLORIDE CRYS ER 20 MEQ PO TBCR
40.0000 meq | EXTENDED_RELEASE_TABLET | Freq: Once | ORAL | Status: AC
  Administered 2023-03-23: 40 meq via ORAL
  Filled 2023-03-23: qty 2

## 2023-03-23 MED ORDER — ENOXAPARIN SODIUM 40 MG/0.4ML IJ SOSY
40.0000 mg | PREFILLED_SYRINGE | INTRAMUSCULAR | Status: DC
  Administered 2023-03-23: 40 mg via SUBCUTANEOUS
  Filled 2023-03-23: qty 0.4

## 2023-03-23 MED ORDER — ACETAMINOPHEN 650 MG RE SUPP: 650.0000 mg | Freq: Four times a day (QID) | RECTAL | Status: DC | PRN

## 2023-03-23 NOTE — ED Provider Notes (Signed)
[place] [facility]PARTMENT AT [facility] Provider Note   CSN: [id number] Arrival date & time: 03/23/23  0545     History  Chief Complaint  Patient presents with   Hypertension    Katie Cook is a 67 y.o. female, history of hypertension, diabetes, who is current being compliant with her blood pressure medication, she states she has been taking her carvedilol, as well as her losartan, daily, however she had a headache, couple days ago, and then today woke up around 4:30 AM, and had double vision, she states the double vision resolved, and then she just had difficulty focusing.  Last vision normal, was at 10 PM last night.  She denies a current headache, chest pain, shortness of breath.  States her chest has been hurting since she fell about a month ago, but was seen and evaluated for this.  She denies any current chest pain or shortness of breath.  No numbness or weakness on one side of the body, or difficulty with speech.  Reports generalized weakness.  Took her blood pressure medication this a.m.  Denies any abdominal pain.  She states she has improved focus, when closing one of her eyes, but more difficulty when having both eyes open.    Home Medications Prior to Admission medications   Medication Sig Start Date End Date Taking? Authorizing Provider  ezetimibe (ZETIA) 10 MG tablet SMARTSIG:1.0 Tablet(s) By Mouth Daily 03/22/23  Yes [provider]  ibuprofen (ADVIL) 800 MG tablet Take 800 mg by mouth 2 (two) times daily as needed. 03/01/23  Yes [provider]  albuterol (VENTOLIN HFA) 108 (90 Base) MCG/ACT inhaler Inhale 1 puff into the lungs every 4 (four) hours as needed for wheezing or shortness of breath. 02/06/23   [provider]  carvedilol (COREG) 25 MG tablet Take 1 tablet (25 mg total) by mouth 2 (two) times daily with a meal. 06/12/20   Miguel Aschoff, MD  ciclopirox [facility]) 8 % solution Apply topically at bedtime.  Apply over nail and surrounding skin. Apply daily over previous coat. After seven (7) days, may remove with alcohol and continue cycle. Patient not taking: Reported on 02/21/2023 11/27/20   Edwin Cap, DPM  cloNIDine (CATAPRES) 0.1 MG tablet Take 0.1 mg by mouth at bedtime. Patient not taking: Reported on 02/21/2023 02/14/21   [provider]  furosemide (LASIX) 40 MG tablet Take 40 mg by mouth daily. 12/31/20   [provider]  glucose blood (ACCU-CHEK GUIDE) test strip Use as instructed to check blood sugar 3-4 times a day 08/11/20   Carlus Pavlov, MD  hydrALAZINE (APRESOLINE) 25 MG tablet  06/20/20   [provider]  hydrochlorothiazide (HYDRODIURIL) 25 MG tablet Take 1 tablet (25 mg total) by mouth daily. Patient not taking: Reported on 02/21/2023 06/12/20 09/10/20  Miguel Aschoff, MD  insulin degludec (TRESIBA FLEXTOUCH) 200 UNIT/ML FlexTouch Pen Inject 50 Units into the skin daily. 01/15/20   Carlus Pavlov, MD  Insulin Lispro (HUMALOG KWIKPEN) 200 UNIT/ML SOPN Inject 25-30 Units into the skin 3 (three) times daily before meals. 11/07/18   Reather Littler, MD  Insulin Pen Needle 32G X 4 MM MISC Use 4 times a day with insulin pen. 01/15/20   Carlus Pavlov, MD  Lancets [facility] ULTRASOFT) lancets Use to test blood sugar 3 times daily as instructed. Dx: E11.65 08/19/14   Carlus Pavlov, MD  levocetirizine (XYZAL) 5 MG tablet TAKE 1 TABLET EVERY DAY IN THE EVENING Patient taking differently:  Take 5 mg by mouth daily as needed for allergies. 12/01/20   Miguel Aschoff, MD  levothyroxine (SYNTHROID) 125 MCG tablet Take 125 mcg by mouth daily before breakfast.    [provider]  losartan (COZAAR) 100 MG tablet Take 1 tablet (100 mg total) by mouth daily. 06/12/20   Miguel Aschoff, MD  nystatin cream (MYCOSTATIN) APPLY 1 APPLICATION TOPICALLY TWICE DAILY Patient not taking: Reported on 02/21/2023 12/01/20   Carlus Pavlov, MD  omeprazole  (PRILOSEC) 20 MG capsule Take 1 capsule (20 mg total) by mouth 2 (two) times daily. Patient not taking: Reported on 02/21/2023 06/11/20   Miguel Aschoff, MD      Allergies    Trulicity [dulaglutide], Metformin and related, Amlodipine, Ceclor [cefaclor], and Sulfa antibiotics    Review of Systems   Review of Systems  Eyes:  Positive for visual disturbance.  Gastrointestinal:  Negative for abdominal pain.  Neurological:  Positive for weakness and headaches. Negative for dizziness and numbness.    Physical Exam Updated Vital Signs BP (!) 191/82 (BP Location: Left Arm)   Pulse 70   Temp 98 F (36.7 C) (Oral)   Resp 19   Ht 5\' 1"  (1.549 m)   Wt 100.7 kg   SpO2 98%   BMI 41.95 kg/m  Physical Exam Vitals and nursing note reviewed.  Constitutional:      General: She is not in acute distress.    Appearance: She is well-developed.  HENT:     Head: Normocephalic and atraumatic.  Eyes:     Conjunctiva/sclera: Conjunctivae normal.  Cardiovascular:     Rate and Rhythm: Normal rate and regular rhythm.     Heart sounds: No murmur heard. Pulmonary:     Effort: Pulmonary effort is normal. No respiratory distress.     Breath sounds: Normal breath sounds.  Abdominal:     Palpations: Abdomen is soft.     Tenderness: There is no abdominal tenderness.  Musculoskeletal:        General: No swelling.     Cervical back: Neck supple.  Skin:    General: Skin is warm and dry.     Capillary Refill: Capillary refill takes less than 2 seconds.  Neurological:     General: No focal deficit present.     Mental Status: She is alert and oriented to person, place, and time.     Cranial Nerves: No cranial nerve deficit.     Sensory: No sensory deficit.     Motor: No weakness.     Coordination: Coordination normal.     Comments: 5 of 5 strength of bilateral upper and lower extremities.  No facial droop noted.  No paresthesias, or sensory deficits.  Has difficulty focusing with eyes, per patient,  but this is more of a perceived, but can follow commands.  Psychiatric:        Mood and Affect: Mood normal.     ED Results / Procedures / Treatments   Labs (all labs ordered are listed, but only abnormal results are displayed) Labs Reviewed  BASIC METABOLIC PANEL - Abnormal; Notable for the following components:      Result Value   Glucose, Bld 176 (*)    All other components within normal limits  CBC  HEPATIC FUNCTION PANEL  MAGNESIUM  PHOSPHORUS  TROPONIN I (HIGH SENSITIVITY)  TROPONIN I (HIGH SENSITIVITY)    EKG None  Radiology MR BRAIN WO CONTRAST  Result Date: 03/23/2023 CLINICAL DATA:  Double vision EXAM: MRI  HEAD WITHOUT CONTRAST TECHNIQUE: Multiplanar, multiecho pulse sequences of the brain and surrounding structures were obtained without intravenous contrast. COMPARISON:  Brain MRI 02/03/2016 FINDINGS: Brain: No acute infarction, hemorrhage, hydrocephalus, extra-axial collection or mass lesion. Chrystine Frogge scattered FLAIR hyperintensities in the cerebral white matter with Sabryn Preslar lacune inferolateral to the left caudate head. Little progression since 2017. Brain volume remains normal. Vascular: Normal flow voids. Skull and upper cervical spine: Normal marrow signal. Sinuses/Orbits: No explanation for double vision. Bilateral cataract resection. Negative sinuses. IMPRESSION: No acute finding or explanation for double vision. Electronically Signed   By: Tiburcio Pea M.D.   On: 03/23/2023 10:18   CT ANGIO HEAD NECK W WO CM  Result Date: 03/23/2023 CLINICAL DATA:  Double vision. EXAM: CT ANGIOGRAPHY HEAD AND NECK WITH AND WITHOUT CONTRAST TECHNIQUE: Multidetector CT imaging of the head and neck was performed using the standard protocol during bolus administration of intravenous contrast. Multiplanar CT image reconstructions and MIPs were obtained to evaluate the vascular anatomy. Carotid stenosis measurements (when applicable) are obtained utilizing NASCET criteria, using the  distal internal carotid diameter as the denominator. RADIATION DOSE REDUCTION: This exam was performed according to the departmental dose-optimization program which includes automated exposure control, adjustment of the mA and/or kV according to patient size and/or use of iterative reconstruction technique. CONTRAST:  75mL OMNIPAQUE IOHEXOL 350 MG/ML SOLN COMPARISON:  02/03/16 FINDINGS: CT HEAD FINDINGS Brain: No evidence of acute infarction, hemorrhage, hydrocephalus, extra-axial collection or mass lesion/mass effect. Vascular: No hyperdense vessel or unexpected calcification. Skull: Normal. Negative for fracture or focal lesion. Sinuses/Orbits: No acute finding. Review of the MIP images confirms the above findings CTA NECK FINDINGS Aortic arch: 3 vessel branching Right carotid system: Atheromatous wall thickening of the common carotid and proximal ICA, mild. No stenosis, ulceration, or beading Left carotid system: Atheromatous wall thickening of the common carotid, mild. No stenosis, ulceration, or beading. Vertebral arteries: Left dominant vertebral artery. The vertebral arteries are smoothly contoured and diffusely patent. No proximal subclavian stenosis. Skeleton: No acute finding Other neck: No acute finding Upper chest: Clear apical lungs Review of the MIP images confirms the above findings CTA HEAD FINDINGS Anterior circulation: Mild atheromatous irregularity of medium size branches. No significant stenosis, proximal occlusion, aneurysm, or vascular malformation. Posterior circulation: Mild atheromatous irregularity of medium size branches. No significant stenosis, proximal occlusion, aneurysm, or vascular malformation. Venous sinuses: As permitted by contrast timing, patent. Anatomic variants: Fetal type left PCA Review of the MIP images confirms the above findings IMPRESSION: No emergent finding or explanation for symptoms. Mild atherosclerosis. Electronically Signed   By: Tiburcio Pea M.D.   On:  03/23/2023 08:09   DG Chest 2 View  Result Date: 03/23/2023 CLINICAL DATA:  67 year old female with history of chest pain. Elevated blood pressure. Blurry vision. EXAM: CHEST - 2 VIEW COMPARISON:  Chest x-ray 02/21/2023. FINDINGS: Lung volumes are normal. No consolidative airspace disease. No pleural effusions. No pneumothorax. No pulmonary nodule or mass noted. Pulmonary vasculature and the cardiomediastinal silhouette are within normal limits. Electronic device projecting over the mid to upper left hemithorax incidentally noted. IMPRESSION: 1.  No radiographic evidence of acute cardiopulmonary disease. Electronically Signed   By: Trudie Reed M.D.   On: 03/23/2023 06:28    Procedures Procedures    Medications Ordered in ED Medications  labetalol (NORMODYNE) injection 10 mg (10 mg Intravenous Given 03/23/23 0746)  iohexol (OMNIPAQUE) 350 MG/ML injection 80 mL (75 mLs Intravenous Contrast Given 03/23/23 0720)  labetalol (NORMODYNE) injection 10 mg (  10 mg Intravenous Given 03/23/23 1150)  potassium chloride SA (KLOR-CON M) CR tablet 40 mEq (40 mEq Oral Given 03/23/23 1150)    ED Course/ Medical Decision Making/ A&P                                 Medical Decision Making Patient is a 67 year old female, here with vision changes that she noticed, around 4:30 AM this morning when she woke up.  She states that she has been compliant with her blood pressure medication, however she has been having intermittent headaches.  Does not take her blood pressure at home.  She arrives with blood pressure of 232/96, and complaining of vision blurring/focusing.  It is improved, when she closes 1 eye.  She has no neurodeficits on my exam, extraocular movements are intact, no evidence of any palsies.  We will obtain a CTA head/neck, as well as an MRI, to rule out stroke.  Last known normal, was last night.  Will also give her labetalol, to help decrease her blood pressure, without lowering it too  fast.  Amount and/or Complexity of Data Reviewed Labs: ordered.    Details: Troponins within normal limits, no electrolyte deficiency  Radiology: ordered.    Details: CTA head/neck and MRI are unremarkable ECG/medicine tests:  Decision-making details documented in ED Course. Discussion of management or test interpretation with external provider(s): Discussed with patient, she is improved, little bit after the blood pressure medication, vision is improving, and focus is improving however still having some difficulty.  She had no eye trauma thus a fluorescein eye exam was not performed.  Visual acuity was performed however.  She is persistently hypertensive, and has been compliant with her blood pressure medications, I am concerned for hypertensive urgency, causing these headaches, and vision blurring.  I spoke with Dr. Robb Matar, who is in agreement with admission, for symptomatic hypertension.  For for further control, and workup.  Risk Prescription drug management. Decision regarding hospitalization.   Final Clinical Impression(s) / ED Diagnoses Final diagnoses:  Hypertensive urgency    Rx / DC Orders ED Discharge Orders     None         Ashyla Luth, [place]ley [place], PA 03/23/23 1209    Coral Spikes, DO 03/23/23 1547

## 2023-03-23 NOTE — ED Notes (Signed)
ED TO INPATIENT HANDOFF REPORT  ED Nurse Name and Phone #: Suann Larry Name/Age/Gender Katie Cook 67 y.o. female Room/Bed: WA20/WA20  Code Status   Code Status: Not on file  Home/SNF/Other Home Patient oriented to: self, place, time, and situation Is this baseline? Yes   Triage Complete: Triage complete  Chief Complaint Hypertensive emergency [I16.1]  Triage Note Pt reports that when she woke up this morning she felt like her eyes just wouldn't focus. Pt denies headache today but did have a headache x 2 days ago. Pt is hypertensive and reports that she takes medication for same.    Allergies Allergies  Allergen Reactions   Trulicity [Dulaglutide] Nausea And Vomiting   Metformin And Related Diarrhea    and dizziness   Amlodipine Swelling   Ceclor [Cefaclor] Rash   Sulfa Antibiotics Rash    Level of Care/Admitting Diagnosis ED Disposition     ED Disposition  Admit   Condition  --   Comment  Hospital Area: [facility] [[id number]]  Level of Care: Progressive [102]  Admit to Progressive based on following criteria: NEUROLOGICAL AND NEUROSURGICAL complex patients with significant risk of instability, who do not meet ICU criteria, yet require close observation or frequent assessment (< / = every 2 - 4 hours) with medical / nursing intervention.  Admit to Progressive based on following criteria: CARDIOVASCULAR & THORACIC of moderate stability with acute coronary syndrome symptoms/low risk myocardial infarction/hypertensive urgency/arrhythmias/heart failure potentially compromising stability and stable post cardiovascular intervention patients.  May place patient in observation at [facility] or [facility] if equivalent level of care is available:: No  Covid Evaluation: Asymptomatic - no recent exposure (last 10 days) testing not required  Diagnosis: Hypertensive emergency [phone]  Admitting Physician: Bobette Mo [[id number]]  Attending  Physician: Bobette Mo [[id number]]          B Medical/Surgery History Past Medical History:  Diagnosis Date   Diabetes mellitus without complication (HCC)    Hypertension    Thyroid disease    Uncontrolled diabetes mellitus type 2 without complications 07/26/2013   Past Surgical History:  Procedure Laterality Date   CESAREAN SECTION     THYROID SURGERY       A IV Location/Drains/Wounds Patient Lines/Drains/Airways Status     Active Line/Drains/Airways     Name Placement date Placement time Site Days   Peripheral IV 03/23/23 20 G Anterior;Right Forearm 03/23/23  0602  Forearm  less than 1            Intake/Output Last 24 hours No intake or output data in the 24 hours ending 03/23/23 1252  Labs/Imaging Results for orders placed or performed during the hospital encounter of 03/23/23 (from the past 48 hour(s))  Basic metabolic panel     Status: Abnormal   Collection Time: 03/23/23  6:04 AM  Result Value Ref Range   Sodium 135 135 - 145 mmol/L   Potassium 3.6 3.5 - 5.1 mmol/L   Chloride 103 98 - 111 mmol/L   CO2 24 22 - 32 mmol/L   Glucose, Bld 176 (H) 70 - 99 mg/dL    Comment: Glucose reference range applies only to samples taken after fasting for at least 8 hours.   BUN 23 8 - 23 mg/dL   Creatinine, Ser 2.95 0.44 - 1.00 mg/dL   Calcium 9.1 8.9 - 62.1 mg/dL   GFR, Estimated >30 >86 mL/min    Comment: (NOTE) Calculated using the CKD-EPI Creatinine Equation (2021)  Anion gap 8 5 - 15    Comment: Performed at [facility], [address] [place], [place] [address]  CBC     Status: None   Collection Time: 03/23/23  6:04 AM  Result Value Ref Range   WBC 8.8 4.0 - 10.5 K/uL   RBC 4.74 3.87 - 5.11 MIL/uL   Hemoglobin 13.0 12.0 - 15.0 g/dL   HCT 62.1 30.8 - 65.7 %   MCV 84.0 80.0 - 100.0 fL   MCH 27.4 26.0 - 34.0 pg   MCHC 32.7 30.0 - 36.0 g/dL   RDW 84.6 96.2 - 95.2 %   Platelets 254 150 - 400 K/uL   nRBC 0.0 0.0 - 0.2 %     Comment: Performed at [facility], [address], [place], [place] [address]  Troponin I (High Sensitivity)     Status: None   Collection Time: 03/23/23  6:04 AM  Result Value Ref Range   Troponin I (High Sensitivity) 9 <18 ng/L    Comment: (NOTE) Elevated high sensitivity troponin I (hsTnI) values and significant  changes across serial measurements may suggest ACS but many other  chronic and acute conditions are known to elevate hsTnI results.  Refer to the "Links" section for chest pain algorithms and additional  guidance. Performed at [facility], [address] [place], [place] [address]   Hepatic function panel     Status: None   Collection Time: 03/23/23  6:04 AM  Result Value Ref Range   Total Protein 7.4 6.5 - 8.1 g/dL   Albumin 4.0 3.5 - 5.0 g/dL   AST 19 15 - 41 U/L   ALT 21 0 - 44 U/L   Alkaline Phosphatase 101 38 - 126 U/L   Total Bilirubin 0.4 <1.2 mg/dL   Bilirubin, Direct 0.1 0.0 - 0.2 mg/dL   Indirect Bilirubin 0.3 0.3 - 0.9 mg/dL    Comment: Performed at [facility], [address], [place], [place] [address]  Troponin I (High Sensitivity)     Status: None   Collection Time: 03/23/23  8:27 AM  Result Value Ref Range   Troponin I (High Sensitivity) 9 <18 ng/L    Comment: (NOTE) Elevated high sensitivity troponin I (hsTnI) values and significant  changes across serial measurements may suggest ACS but many other  chronic and acute conditions are known to elevate hsTnI results.  Refer to the "Links" section for chest pain algorithms and additional  guidance. Performed at [facility], [address], [place], [place] [address]    MR BRAIN WO CONTRAST  Result Date: 03/23/2023 CLINICAL DATA:  Double vision EXAM: MRI HEAD WITHOUT CONTRAST TECHNIQUE: Multiplanar, multiecho pulse sequences of the brain and surrounding structures were obtained without intravenous contrast. COMPARISON:   Brain MRI 02/03/2016 FINDINGS: Brain: No acute infarction, hemorrhage, hydrocephalus, extra-axial collection or mass lesion. Small scattered FLAIR hyperintensities in the cerebral white matter with small lacune inferolateral to the left caudate head. Little progression since 2017. Brain volume remains normal. Vascular: Normal flow voids. Skull and upper cervical spine: Normal marrow signal. Sinuses/Orbits: No explanation for double vision. Bilateral cataract resection. Negative sinuses. IMPRESSION: No acute finding or explanation for double vision. Electronically Signed   By: Tiburcio Pea M.D.   On: 03/23/2023 10:18   CT ANGIO HEAD NECK W WO CM  Result Date: 03/23/2023 CLINICAL DATA:  Double vision. EXAM: CT ANGIOGRAPHY HEAD AND NECK WITH AND WITHOUT CONTRAST TECHNIQUE: Multidetector CT  imaging of the head and neck was performed using the standard protocol during bolus administration of intravenous contrast. Multiplanar CT image reconstructions and MIPs were obtained to evaluate the vascular anatomy. Carotid stenosis measurements (when applicable) are obtained utilizing NASCET criteria, using the distal internal carotid diameter as the denominator. RADIATION DOSE REDUCTION: This exam was performed according to the departmental dose-optimization program which includes automated exposure control, adjustment of the mA and/or kV according to patient size and/or use of iterative reconstruction technique. CONTRAST:  75mL OMNIPAQUE IOHEXOL 350 MG/ML SOLN COMPARISON:  02/03/16 FINDINGS: CT HEAD FINDINGS Brain: No evidence of acute infarction, hemorrhage, hydrocephalus, extra-axial collection or mass lesion/mass effect. Vascular: No hyperdense vessel or unexpected calcification. Skull: Normal. Negative for fracture or focal lesion. Sinuses/Orbits: No acute finding. Review of the MIP images confirms the above findings CTA NECK FINDINGS Aortic arch: 3 vessel branching Right carotid system: Atheromatous wall thickening  of the common carotid and proximal ICA, mild. No stenosis, ulceration, or beading Left carotid system: Atheromatous wall thickening of the common carotid, mild. No stenosis, ulceration, or beading. Vertebral arteries: Left dominant vertebral artery. The vertebral arteries are smoothly contoured and diffusely patent. No proximal subclavian stenosis. Skeleton: No acute finding Other neck: No acute finding Upper chest: Clear apical lungs Review of the MIP images confirms the above findings CTA HEAD FINDINGS Anterior circulation: Mild atheromatous irregularity of medium size branches. No significant stenosis, proximal occlusion, aneurysm, or vascular malformation. Posterior circulation: Mild atheromatous irregularity of medium size branches. No significant stenosis, proximal occlusion, aneurysm, or vascular malformation. Venous sinuses: As permitted by contrast timing, patent. Anatomic variants: Fetal type left PCA Review of the MIP images confirms the above findings IMPRESSION: No emergent finding or explanation for symptoms. Mild atherosclerosis. Electronically Signed   By: Tiburcio Pea M.D.   On: 03/23/2023 08:09   DG Chest 2 View  Result Date: 03/23/2023 CLINICAL DATA:  68 year old female with history of chest pain. Elevated blood pressure. Blurry vision. EXAM: CHEST - 2 VIEW COMPARISON:  Chest x-ray 02/21/2023. FINDINGS: Lung volumes are normal. No consolidative airspace disease. No pleural effusions. No pneumothorax. No pulmonary nodule or mass noted. Pulmonary vasculature and the cardiomediastinal silhouette are within normal limits. Electronic device projecting over the mid to upper left hemithorax incidentally noted. IMPRESSION: 1.  No radiographic evidence of acute cardiopulmonary disease. Electronically Signed   By: Trudie Reed M.D.   On: 03/23/2023 06:28    Pending Labs Unresulted Labs (From admission, onward)     Start     Ordered   03/23/23 1111  Magnesium  Add-on,   AD        03/23/23  1110   03/23/23 1111  Phosphorus  Add-on,   AD        03/23/23 1110            Vitals/Pain Today's Vitals   03/23/23 0900 03/23/23 0930 03/23/23 1023 03/23/23 1248  BP: (!) 182/86 (!) 182/87 (!) 191/82 (!) 173/85  Pulse: 67 70 70   Resp: 19 15 19    Temp:   98 F (36.7 C)   TempSrc:   Oral   SpO2: 93% 98% 98%   Weight:      Height:      PainSc:        Isolation Precautions No active isolations  Medications Medications  insulin glargine-yfgn (SEMGLEE) injection 30 Units (has no administration in time range)  oxyCODONE (Oxy IR/ROXICODONE) immediate release tablet 5 mg (has no administration in time range)  furosemide (LASIX) injection 40 mg (has no administration in time range)  carvedilol (COREG) tablet 25 mg (has no administration in time range)  furosemide (LASIX) tablet 40 mg (has no administration in time range)  levothyroxine (SYNTHROID) tablet 125 mcg (has no administration in time range)  losartan (COZAAR) tablet 100 mg (has no administration in time range)  labetalol (NORMODYNE) injection 10 mg (10 mg Intravenous Given 03/23/23 0746)  iohexol (OMNIPAQUE) 350 MG/ML injection 80 mL (75 mLs Intravenous Contrast Given 03/23/23 0720)  labetalol (NORMODYNE) injection 10 mg (10 mg Intravenous Given 03/23/23 1150)  potassium chloride SA (KLOR-CON M) CR tablet 40 mEq (40 mEq Oral Given 03/23/23 1150)  hydrALAZINE (APRESOLINE) injection 20 mg (20 mg Intravenous Given 03/23/23 1248)  ketorolac (TORADOL) 15 MG/ML injection 15 mg (15 mg Intravenous Given 03/23/23 1242)    Mobility walks with person assist     Focused Assessments Hypertension   R Recommendations: See Admitting Provider Note  Report given to:   Additional Notes: .

## 2023-03-23 NOTE — H&P (Signed)
History and Physical    Patient: Katie Cook:[id number] DOB: [birth year] DOA: 03/23/2023 DOS: the patient was seen and examined on 03/23/2023 PCP: Loura Back, NP  Patient coming from: Home  Chief Complaint:  Chief Complaint  Patient presents with   Hypertension   HPI: Katie Cook is a 67 y.o. female with medical history significant of class III obesity, type 2 diabetes, hypertension, asthma, hypothyroidism who presented to the emergency department with complaints of waking up around 0430 today with double vision that subsequently resolved, but this was followed by difficulty focusing.  She also endorsed having a headache 2 days ago, but no headache today.  She has been taking her losartan and carvedilol.  She just recently was restarted on furosemide.  She denied fever, chills, rhinorrhea, sore throat, wheezing or hemoptysis.  No chest pain, palpitations, diaphoresis, PND, orthopnea or pitting edema of the lower extremities.  No abdominal pain, nausea, emesis, diarrhea, constipation, melena or hematochezia.  No flank pain, dysuria, frequency or hematuria.  No polyuria, polydipsia, polyphagia or blurred vision.   Lab work: CBC, LFTs troponin x 2, magnesium and phosphorus were normal.  BMP showed a glucose of 176 mg/dL, but all other values are within normal limits.  Imaging: 2 view chest radiograph with no evidence of acute cardiopulmonary disease.  CTA head and neck with mild atherosclerosis but no emergent finding or explanation for symptoms.  MRI head without contrast with no acute finding or explanation for double vision.   ED course: Initial vital signs were temperature 98.7 F, pulse 79, respiration 18, BP 208/98 mmHg and O2 sat 100% on room air.  The patient received labetalol 10 mg IVP x 2.  I added hydralazine 20 mg IVP, furosemide 40 mg IVP and K-Lor 40 mEq p.o. x 1.    Review of Systems: As mentioned in the history of present illness. All other systems reviewed and  are negative.  Past Medical History:  Diagnosis Date   Diabetes mellitus without complication (HCC)    Hypertension    Thyroid disease    Uncontrolled diabetes mellitus type 2 without complications 07/26/2013   Past Surgical History:  Procedure Laterality Date   CESAREAN SECTION     THYROID SURGERY     Social History:  reports that she has never smoked. She has never used smokeless tobacco. She reports that she does not drink alcohol and does not use drugs.  Allergies  Allergen Reactions   Trulicity [Dulaglutide] Nausea And Vomiting   Metformin And Related Diarrhea    and dizziness   Amlodipine Swelling   Ceclor [Cefaclor] Rash   Sulfa Antibiotics Rash    Family History  Problem Relation Age of Onset   Ovarian cancer Mother    Cancer Mother    Cancer Father        pancreatic    Stroke Father    Diabetes Sister    Cancer Maternal Aunt        breast cancer    Diabetes Maternal Grandmother    Cancer Maternal Aunt        thyroid    Hypertension Sister    Stroke Paternal Uncle     Prior to Admission medications   Medication Sig Start Date End Date Taking? Authorizing Provider  albuterol (VENTOLIN HFA) 108 (90 Base) MCG/ACT inhaler Inhale 1 puff into the lungs every 4 (four) hours as needed for wheezing or shortness of breath. 02/06/23   [provider]  Blood Glucose Monitoring Suppl (ACCU-CHEK  GUIDE ME) w/Device KIT Use as instructed to check blood sugar 3-4 times a day 08/11/20   Carlus Pavlov, MD  carvedilol (COREG) 25 MG tablet Take 1 tablet (25 mg total) by mouth 2 (two) times daily with a meal. 06/12/20   Miguel Aschoff, MD  ciclopirox [facility]) 8 % solution Apply topically at bedtime. Apply over nail and surrounding skin. Apply daily over previous coat. After seven (7) days, may remove with alcohol and continue cycle. Patient not taking: Reported on 02/21/2023 11/27/20   Edwin Cap, DPM  cloNIDine (CATAPRES) 0.1 MG tablet Take 0.1 mg by mouth at  bedtime. Patient not taking: Reported on 02/21/2023 02/14/21   [provider]  furosemide (LASIX) 40 MG tablet Take 40 mg by mouth daily. 12/31/20   [provider]  glucose blood (ACCU-CHEK GUIDE) test strip Use as instructed to check blood sugar 3-4 times a day 08/11/20   Carlus Pavlov, MD  hydrALAZINE (APRESOLINE) 25 MG tablet  06/20/20   [provider]  hydrochlorothiazide (HYDRODIURIL) 25 MG tablet Take 1 tablet (25 mg total) by mouth daily. Patient not taking: Reported on 02/21/2023 06/12/20 09/10/20  Miguel Aschoff, MD  insulin degludec (TRESIBA FLEXTOUCH) 200 UNIT/ML FlexTouch Pen Inject 50 Units into the skin daily. 01/15/20   Carlus Pavlov, MD  Insulin Lispro (HUMALOG KWIKPEN) 200 UNIT/ML SOPN Inject 25-30 Units into the skin 3 (three) times daily before meals. 11/07/18   Reather Littler, MD  Insulin Pen Needle 32G X 4 MM MISC Use 4 times a day with insulin pen. 01/15/20   Carlus Pavlov, MD  Lancets [facility] ULTRASOFT) lancets Use to test blood sugar 3 times daily as instructed. Dx: E11.65 08/19/14   Carlus Pavlov, MD  levocetirizine (XYZAL) 5 MG tablet TAKE 1 TABLET EVERY DAY IN THE EVENING Patient taking differently: Take 5 mg by mouth daily as needed for allergies. 12/01/20   Miguel Aschoff, MD  levothyroxine (SYNTHROID) 125 MCG tablet Take 125 mcg by mouth daily before breakfast.    [provider]  losartan (COZAAR) 100 MG tablet Take 1 tablet (100 mg total) by mouth daily. 06/12/20   Miguel Aschoff, MD  nystatin cream (MYCOSTATIN) APPLY 1 APPLICATION TOPICALLY TWICE DAILY Patient not taking: Reported on 02/21/2023 12/01/20   Carlus Pavlov, MD  omeprazole (PRILOSEC) 20 MG capsule Take 1 capsule (20 mg total) by mouth 2 (two) times daily. Patient not taking: Reported on 02/21/2023 06/11/20   Miguel Aschoff, MD    Physical Exam: Vitals:   03/23/23 0830 03/23/23 0900 03/23/23 0930 03/23/23 1023  BP: (!) 185/83 (!) 182/86  (!) 182/87 (!) 191/82  Pulse: 69 67 70 70  Resp: 15 19 15 19   Temp:    98 F (36.7 C)  TempSrc:    Oral  SpO2: 99% 93% 98% 98%  Weight:      Height:       Physical Exam Vitals reviewed.  Constitutional:      General: She is awake. She is not in acute distress.    Appearance: She is morbidly obese. She is ill-appearing.  HENT:     Head: Normocephalic.     Nose: No rhinorrhea.     Mouth/Throat:     Mouth: Mucous membranes are moist.  Eyes:     General: No scleral icterus.    Pupils: Pupils are equal, round, and reactive to light.  Neck:     Vascular: No JVD.  Cardiovascular:     Rate and  Rhythm: Normal rate and regular rhythm.     Heart sounds: S1 normal and S2 normal.     Comments: Stage II lymphedema. Pulmonary:     Effort: Pulmonary effort is normal.     Breath sounds: Normal breath sounds.  Abdominal:     General: Bowel sounds are normal. There is no distension.     Palpations: Abdomen is soft.     Tenderness: There is no abdominal tenderness.  Musculoskeletal:     Cervical back: Neck supple.     Right lower leg: 2+ Edema present.     Left lower leg: 2+ Edema present.  Skin:    General: Skin is warm and dry.  Neurological:     General: No focal deficit present.     Mental Status: She is alert and oriented to person, place, and time.  Psychiatric:        Mood and Affect: Mood normal.        Behavior: Behavior normal. Behavior is cooperative.     Data Reviewed:  Results are pending, will review when available. 08/11/2020 ECHOCARDIOGRAM REPORT   IMPRESSIONS:   1. Left ventricular ejection fraction, by estimation, is 60 to 65%. The left ventricle has normal function. The left ventricle has no regional wall motion abnormalities. There is mild concentric left ventricular hypertrophy. Left ventricular diastolic parameters are consistent with Grade II diastolic dysfunction (pseudonormalization). Elevated left ventricular end-diastolic pressure.  2. Right  ventricular systolic function is normal. The right ventricular size is normal. There is normal pulmonary artery systolic pressure.  3. Left atrial size was moderately dilated.  4. The mitral valve is normal in structure. Trivial mitral valve regurgitation. No evidence of mitral stenosis.  5. The aortic valve is normal in structure. Aortic valve regurgitation is not visualized. No aortic stenosis is present.  6. The inferior vena cava is normal in size with greater than 50% respiratory variability, suggesting right atrial pressure of 3 mmHg.  EKG: Vent. rate 77 BPM PR interval 183 ms QRS duration 93 ms QT/QTcB 391/443 ms P-R-T axes 76 53 117 Sinus rhythm Nonspecific T abnormalities, lateral leads  Assessment and Plan: Principal Problem:   Hypertensive emergency Observation/PCU. Furosemide 40 mg IVP x 1 dose. Resume furosemide 40 mg p.o. daily. Continue losartan 100 mg p.o. daily. Continue carvedilol 25 mg po twice daily. Hydralazine 20 mg IVP every 4 hours as needed. Keep electrolytes optimized. Check transthoracic echocardiogram.  Active Problems:   Uncontrolled type 2 diabetes mellitus with hyperglycemia,  with long-term current use of insulin (HCC) Carbohydrate modified diet. CBG monitoring with RI SS. Check hemoglobin A1c.    Asthma No complaints of wheezing. Albuterol MDI or nebs PRN.    Class 3 severe obesity with serious comorbidity  and body mass index (BMI) of 40.0 to 44.9 in adult [facility]) Current BMI 43.10 kg/m. Lifestyle modifications. Follow-up with closely PCP and/or bariatric clinic.    Hypothyroidism Continue levothyroxine 125 mcg po daily.    Grade II diastolic dysfunction Continue ARB and beta-blocker. Furosemide 40 mg IVP x 1 dose. Resume furosemide 40 mg p.o. daily tomorrow.    Advance Care Planning:   Code Status: Full Code   Consults:   Family Communication:   Severity of Illness: The appropriate patient status for this patient is  OBSERVATION. Observation status is judged to be reasonable and necessary in order to provide the required intensity of service to ensure the patient's safety. The patient's presenting symptoms, physical exam findings, and initial radiographic and laboratory  data in the context of their medical condition is felt to place them at decreased risk for further clinical deterioration. Furthermore, it is anticipated that the patient will be medically stable for discharge from the hospital within 2 midnights of admission.   Author: Bobette Mo, MD 03/23/2023 11:11 AM  For on call review [email or web address].   This document was prepared using Dragon voice recognition software and may contain some unintended transcription errors.

## 2023-03-23 NOTE — ED Notes (Signed)
Patient transported to MRI 

## 2023-03-23 NOTE — ED Triage Notes (Signed)
Pt reports that when she woke up this morning she felt like her eyes just wouldn't focus. Pt denies headache today but did have a headache x 2 days ago. Pt is hypertensive and reports that she takes medication for same.

## 2023-03-24 ENCOUNTER — Observation Stay (HOSPITAL_COMMUNITY): Payer: Medicare (Managed Care)

## 2023-03-24 DIAGNOSIS — I5189 Other ill-defined heart diseases: Secondary | ICD-10-CM

## 2023-03-24 DIAGNOSIS — E039 Hypothyroidism, unspecified: Secondary | ICD-10-CM

## 2023-03-24 DIAGNOSIS — E66813 Obesity, class 3: Secondary | ICD-10-CM | POA: Diagnosis not present

## 2023-03-24 DIAGNOSIS — Z6841 Body Mass Index (BMI) 40.0 and over, adult: Secondary | ICD-10-CM

## 2023-03-24 DIAGNOSIS — I5021 Acute systolic (congestive) heart failure: Secondary | ICD-10-CM

## 2023-03-24 DIAGNOSIS — E1165 Type 2 diabetes mellitus with hyperglycemia: Secondary | ICD-10-CM | POA: Diagnosis not present

## 2023-03-24 DIAGNOSIS — I161 Hypertensive emergency: Secondary | ICD-10-CM | POA: Diagnosis not present

## 2023-03-24 DIAGNOSIS — G459 Transient cerebral ischemic attack, unspecified: Secondary | ICD-10-CM | POA: Insufficient documentation

## 2023-03-24 DIAGNOSIS — Z794 Long term (current) use of insulin: Secondary | ICD-10-CM

## 2023-03-24 DIAGNOSIS — H532 Diplopia: Secondary | ICD-10-CM | POA: Insufficient documentation

## 2023-03-24 DIAGNOSIS — R9431 Abnormal electrocardiogram [ECG] [EKG]: Secondary | ICD-10-CM | POA: Diagnosis not present

## 2023-03-24 LAB — ECHOCARDIOGRAM COMPLETE
Area-P 1/2: 4.08 cm2
Height: 60 in
S' Lateral: 3.2 cm
Weight: 3531.2 [oz_av]

## 2023-03-24 LAB — COMPREHENSIVE METABOLIC PANEL
ALT: 20 U/L (ref 0–44)
AST: 15 U/L (ref 15–41)
Albumin: 3.9 g/dL (ref 3.5–5.0)
Alkaline Phosphatase: 103 U/L (ref 38–126)
Anion gap: 10 (ref 5–15)
BUN: 26 mg/dL — ABNORMAL HIGH (ref 8–23)
CO2: 25 mmol/L (ref 22–32)
Calcium: 9 mg/dL (ref 8.9–10.3)
Chloride: 101 mmol/L (ref 98–111)
Creatinine, Ser: 0.64 mg/dL (ref 0.44–1.00)
GFR, Estimated: >60 mL/min (ref >60)
Glucose, Bld: 213 mg/dL — ABNORMAL HIGH (ref 70–99)
Potassium: 3.7 mmol/L (ref 3.5–5.1)
Sodium: 136 mmol/L (ref 135–145)
Total Bilirubin: 0.4 mg/dL (ref <1.2)
Total Protein: 7.1 g/dL (ref 6.5–8.1)

## 2023-03-24 LAB — CBC
HCT: 40 % (ref 36.0–46.0)
Hemoglobin: 12.8 g/dL (ref 12.0–15.0)
MCH: 26.9 pg (ref 26.0–34.0)
MCHC: 32 g/dL (ref 30.0–36.0)
MCV: 84 fL (ref 80.0–100.0)
Platelets: 280 10*3/uL (ref 150–400)
RBC: 4.76 MIL/uL (ref 3.87–5.11)
RDW: 14.7 % (ref 11.5–15.5)
WBC: 8.9 10*3/uL (ref 4.0–10.5)
nRBC: 0 % (ref 0.0–0.2)

## 2023-03-24 LAB — LIPID PANEL
Cholesterol: 203 mg/dL — ABNORMAL HIGH (ref 0–200)
HDL: 61 mg/dL (ref >40)
LDL Cholesterol: 122 mg/dL — ABNORMAL HIGH (ref 0–99)
Total CHOL/HDL Ratio: 3.3 {ratio}
Triglycerides: 99 mg/dL (ref <150)
VLDL: 20 mg/dL (ref 0–40)

## 2023-03-24 LAB — GLUCOSE, CAPILLARY
Glucose-Capillary: 226 mg/dL — ABNORMAL HIGH (ref 70–99)
Glucose-Capillary: 208 mg/dL — ABNORMAL HIGH (ref 70–99)
Glucose-Capillary: 177 mg/dL — ABNORMAL HIGH (ref 70–99)
Glucose-Capillary: 208 mg/dL — ABNORMAL HIGH (ref 70–99)
Glucose-Capillary: 307 mg/dL — ABNORMAL HIGH (ref 70–99)

## 2023-03-24 LAB — HEMOGLOBIN A1C
Hgb A1c MFr Bld: 8.8 % — ABNORMAL HIGH (ref 4.8–5.6)
Mean Plasma Glucose: 205.86 mg/dL

## 2023-03-24 LAB — HIV ANTIBODY (ROUTINE TESTING W REFLEX): HIV Screen 4th Generation wRfx: NONREACTIVE

## 2023-03-24 LAB — TSH: TSH: 3.593 u[IU]/mL (ref 0.350–4.500)

## 2023-03-24 MED ORDER — ATORVASTATIN CALCIUM 40 MG PO TABS
40.0000 mg | ORAL_TABLET | Freq: Every day | ORAL | Status: DC
  Administered 2023-03-24: 40 mg via ORAL
  Filled 2023-03-24: qty 1

## 2023-03-24 MED ORDER — CLOPIDOGREL BISULFATE 75 MG PO TABS: 75.0000 mg | ORAL_TABLET | Freq: Every day | ORAL | 0 refills | Status: AC

## 2023-03-24 MED ORDER — ASPIRIN 81 MG PO TBEC: 81.0000 mg | DELAYED_RELEASE_TABLET | Freq: Every day | ORAL | Status: AC

## 2023-03-24 MED ORDER — ATORVASTATIN CALCIUM 40 MG PO TABS: 40.0000 mg | ORAL_TABLET | Freq: Every day | ORAL | 0 refills | Status: AC

## 2023-03-24 MED ORDER — ASPIRIN 81 MG PO TBEC
81.0000 mg | DELAYED_RELEASE_TABLET | Freq: Every day | ORAL | Status: DC
Start: 2023-03-24 — End: 2023-03-24
  Administered 2023-03-24: 81 mg via ORAL
  Filled 2023-03-24: qty 1

## 2023-03-24 MED ORDER — PANTOPRAZOLE SODIUM 40 MG PO TBEC: 40.0000 mg | DELAYED_RELEASE_TABLET | Freq: Every day | ORAL | 1 refills | Status: AC

## 2023-03-24 MED ORDER — CLOPIDOGREL BISULFATE 75 MG PO TABS
75.0000 mg | ORAL_TABLET | Freq: Every day | ORAL | Status: DC
  Administered 2023-03-24: 75 mg via ORAL
  Filled 2023-03-24: qty 1

## 2023-03-24 MED ORDER — INSULIN GLARGINE-YFGN 100 UNIT/ML ~~LOC~~ SOLN
40.0000 [IU] | Freq: Every day | SUBCUTANEOUS | Status: DC
  Filled 2023-03-24: qty 0.4

## 2023-03-24 NOTE — TOC Initial Note (Signed)
Transition of Care [facility]) - Initial/Assessment Note    Patient Details  Name: Katie Cook MRN: [id number] Date of Birth: 21-Apr-1955  Transition of Care [facility]) CM/SW Contact:    Lanier Clam, RN Phone Number: 03/24/2023, 11:38 AM  Clinical Narrative:  d/c plan home.                 Expected Discharge Plan: Home/Self Care Barriers to Discharge: Continued Medical Work up   Patient Goals and CMS Choice Patient states their goals for this hospitalization and ongoing recovery are:: Home CMS Medicare.gov Compare Post Acute Care list provided to:: Patient Choice offered to / list presented to : Patient [place] ownership interest in [facility].provided to:: Patient    Expected Discharge Plan and Services   Discharge Planning Services: CM Consult Post Acute Care Choice: Resumption of Svcs/PTA Provider Living arrangements for the past 2 months: Single Family Home                                      Prior Living Arrangements/Services Living arrangements for the past 2 months: Single Family Home Lives with:: Self                   Activities of Daily Living   ADL Screening (condition at time of admission) Independently performs ADLs?: Yes (appropriate for developmental age) Is the patient deaf or have difficulty hearing?: No Does the patient have difficulty seeing, even when wearing glasses/contacts?: No Does the patient have difficulty concentrating, remembering, or making decisions?: No  Permission Sought/Granted                  Emotional Assessment              Admission diagnosis:  Hypertensive urgency [I16.0] Vision changes [H53.9] Hypertensive emergency [I16.1] Patient Active Problem List   Diagnosis Date Noted   Hypertensive emergency 03/23/2023   Hypothyroidism 03/23/2023   Grade II diastolic dysfunction 03/23/2023   Drug-induced myopathy 01/28/2023   Class 3 severe obesity with serious comorbidity and body mass  index (BMI) of 40.0 to 44.9 in adult (HCC) 06/05/2020   Major depressive disorder, single episode, moderate (HCC) 06/05/2020   Routine adult health maintenance 06/05/2020   Diabetic retinopathy (HCC) 06/05/2020   Numbness of toes 03/04/2020   Shortness of breath 03/04/2020   Hypertension 09/17/2019   Asthma 09/17/2019   Benign paroxysmal positional vertigo of right ear 05/25/2017   Sensorineural hearing loss (SNHL) of both ears 05/25/2017   Temporomandibular joint disorder 05/25/2017   Mild cognitive impairment 01/05/2016   Post-surgical hypothyroidism 07/26/2013   Uncontrolled type 2 diabetes mellitus with hyperglycemia, with long-term current use of insulin (HCC) 07/26/2013   PCP:  Loura Back, NP Pharmacy:   [facility] - [place], [place] - [address] [place] [address] Phone: [phone] Fax: [phone]  [facility] - [place], [place] - [address] [place] [address] Phone: [phone] Fax: [phone]  [id number] - [place], [place] - [address] AT [facility] [address] [place] [address] Phone: [phone] Fax: [phone]     Social Drivers of Health (SDOH) Social History: SDOH Screenings   Food Insecurity: No Food Insecurity (03/23/2023)  Housing: Patient Declined (03/23/2023)  Transportation Needs: No Transportation Needs (03/23/2023)  Utilities: Not At Risk (03/23/2023)  Depression (PHQ2-9): Medium Risk (03/17/2020)  Tobacco Use: Low Risk  (03/23/2023)   SDOH Interventions:     Readmission Risk Interventions     No data to display

## 2023-03-24 NOTE — Progress Notes (Signed)
   03/24/23 1100  Spiritual Encounters  Type of Visit Initial  Care provided to: Patient  Referral source Nurse (RN/NT/LPN)  Reason for visit Urgent spiritual support  OnCall Visit No  Spiritual Framework  Patient Stress Factors Not reviewed  Family Stress Factors Not reviewed  Interventions  Spiritual Care Interventions Made Established relationship of care and support;Compassionate presence;Reflective listening  Intervention Outcomes  Outcomes Awareness of support  Spiritual Care Plan  Spiritual Care Issues Still Outstanding No further spiritual care needs at this time (see row info)   Advance directive was put in for the patient but the patient did not request an advance directive. Explain to patient the process of Advance directive and patient made a choice to not go through the process of filling out an AD.

## 2023-03-24 NOTE — TOC Transition Note (Signed)
Transition of Care [facility]) - Discharge Note   Patient Details  Name: Katie Cook MRN: [id number] Date of Birth: [birth year]  Transition [facility] Care [facility]) CM/SW Contact:  Lanier Clam, RN Phone Number: 03/24/2023, 2:08 PM   Clinical Narrative: d/c home no needs.      Final next level of care: Home/Self Care Barriers to Discharge: No Barriers Identified   Patient Goals and CMS Choice Patient states their goals for this hospitalization and ongoing recovery are:: Home CMS Medicare.gov Compare Post Acute Care list provided to:: Patient Choice offered to / list presented to : [name] ownership interest in [facility].provided to:: Patient    Discharge Placement                       Discharge Plan and Services Additional resources added to the After Visit Summary for     Discharge Planning Services: CM Consult Post Acute Care Choice: Resumption of Svcs/PTA Provider                               Social Drivers of Health (SDOH) Interventions SDOH Screenings   Food Insecurity: No Food Insecurity (03/23/2023)  Housing: Patient Declined (03/23/2023)  Transportation Needs: No Transportation Needs (03/23/2023)  Utilities: Not At Risk (03/23/2023)  Depression (PHQ2-9): Medium Risk (03/17/2020)  Tobacco Use: Low Risk  (03/23/2023)     Readmission Risk Interventions     No data to display

## 2023-03-24 NOTE — Care Management Obs Status (Signed)
MEDICARE OBSERVATION STATUS NOTIFICATION   Patient Details  Name: THAVY ROWLES MRN: [id number] Date of Birth: Aug 04, 1955   Medicare Observation Status Notification Given:  Yes    MahabirOlegario Messier, RN 03/24/2023, 11:37 AM

## 2023-03-24 NOTE — Discharge Summary (Signed)
Physician Discharge Summary  Katie Cook ZOX:[id number] DOB: [birth year] DOA: 03/23/2023  PCP: Loura Back, NP  Admit date: 03/23/2023 Discharge date: 03/24/2023  4:18 PM  Admitted From: Home Disposition: Home Recommendations for Outpatient Follow-up:  Follow up with PCP in 1 week Reassess glycemic control, blood pressure, CMP and CBC at follow-up Please follow up on the following pending results: None  Home Health: Not indicated Equipment/Devices: Not indicated  Discharge Condition: Stable CODE STATUS: Full code  Follow-up Information     Loura Back, NP. Schedule an appointment as soon as possible for a visit in 1 week(s).   Specialty: Nurse Practitioner Contact information: [address] [place] [address] [phone]                 Hospital course 67 year old F with PMH of morbid obesity, IDDM-2, uncontrolled HTN, asthma, hypothyroidism and hyperlipidemia presenting with transient double vision followed by difficulty focusing after waking up from sleep about 4:30 AM on the day of presentation, and admitted with uncontrolled hypertension with blood pressure elevated to 280/98.  In ED, labs without significant finding other than mildly elevated glucose 276.  CXR without acute finding.  CT head, CT angio head and neck and MRI brain without significant finding to explain patient's presentation.  67 year old F with PMH on home antihypertensive meds and PRNs, and admitted for further care.  TTE ordered.  Patient reports good compliance with home antihypertensive meds.  She has been taking ibuprofen for pain.  Denies history of sleep apnea.   The next day, patient's blood pressure improved.  She had another episode of brief binocular diplopia when she woke up from sleep about 5 AM.  She had no other focal neurosymptoms.  Case discussed with on-call neurologist, Dr. Amada Jupiter who recommended managing as TIA with Plavix and aspirin for 3 weeks followed by low-dose  aspirin alone, initiating statin and aggressive management of diabetes and blood pressure.  TTE with elevated from 60 to 65% and G2 DD which is basically unchanged from baseline.  See individual problem list below for more.   Problems addressed during this hospitalization Hypertensive crisis/urgency: Unclear etiology.  Patient reports compliance with home antihypertensive meds and diuretics.  Blood pressure improved with home medications. -Continue home Coreg, losartan and Lasix. -Reassess at follow-up  Transient and episodic binocular diplopia: CT head, CT angio head and neck and MRI brain without significant finding.  Happens when she wakes up from sleep and lasted few seconds and resolves.  Discussed with neurology who recommended monitoring as TIA as above.  LDL is 122.  A1c is 8.8%.  Emphasized the importance of good glycemic control.  Uncontrolled diabetes with hyperglycemia and hyperlipidemia: A1c 8.8%. -Continue home meds  Grade 2 diastolic dysfunction: No cardiopulmonary symptoms. -Continue home meds.  Mild intermittent asthma: Stable.  Continue home meds  Hypothyroidism: TSH within normal -Continue home meds  Morbid obesity Body mass index is 43.1 kg/m. -Encourage lifestyle change to lose weight -Could benefit from GLP-1 agonist    Time spent 45 minutes  Vital signs Vitals:   03/24/23 0427 03/24/23 0922 03/24/23 1200 03/24/23 1516  BP: (!) 152/71 (!) 155/69 (!) 140/62 (!) 150/65  Pulse: 70  63 67  Temp: 98.1 F (36.7 C)  98 F (36.7 C)   Resp: 18 (!) 23 20   Height:      Weight:      SpO2: 96%  94%   TempSrc: Oral  Oral   BMI (Calculated):  Discharge exam  GENERAL: No apparent distress.  Nontoxic. HEENT: MMM.  Vision and hearing grossly intact.  NECK: Supple.  No apparent JVD.  RESP:  No IWOB.  Fair aeration bilaterally. CVS:  RRR. Heart sounds normal.  ABD/GI/GU: BS+. Abd soft, NTND.  MSK/EXT:  Moves extremities. No apparent deformity. No  edema.  SKIN: no apparent skin lesion or wound NEURO: Awake, alert and oriented appropriately. Speech clear. Cranial nerves II-XII grossly intact. Motor 5/5 in all muscle groups of UE and LE bilaterally, Normal tone. Light sensation intact in all dermatomes of upper and lower ext bilaterally. Patellar reflex symmetric.  No pronator drift.  Finger to nose intact. PSYCH: Calm. Normal affect.   Discharge Instructions Discharge Instructions     Diet - low sodium heart healthy   Complete by: As directed    Diet Carb Modified   Complete by: As directed    Discharge instructions   Complete by: As directed    It has been a pleasure taking care of you!  You were hospitalized due to visual disturbance and markedly elevated blood pressure.  Your MRI did not show stroke.  However, your visual disturbance could be due to transient ischemic attack (TIA).  After discussion with neurology, we have started you on Plavix, aspirin and Lipitor to reduce your risk of having a stroke.  It is also very important that you have your blood pressure and diabetes under good control.  Avoid any over-the-counter pain medication other than plain Tylenol.  Follow-up with your primary care doctor in 1 to 2 weeks or sooner if needed.   Take care,   Increase activity slowly   Complete by: As directed       Allergies as of 03/24/2023       Reactions   Trulicity [dulaglutide] Nausea And Vomiting   Metformin And Related Diarrhea   and dizziness   Amlodipine Swelling   Ceclor [cefaclor] Rash   Sulfa Antibiotics Rash        Medication List     STOP taking these medications    hydrochlorothiazide 25 MG tablet Commonly known as: HYDRODIURIL   ibuprofen 800 MG tablet Commonly known as: ADVIL   omeprazole 20 MG capsule Commonly known as: PRILOSEC       TAKE these medications    Accu-Chek Guide test strip Generic drug: glucose blood Use as instructed to check blood sugar 3-4 times a day    acetaminophen 500 MG tablet Commonly known as: TYLENOL Take 1,000 mg by mouth 3 (three) times daily as needed for mild pain (pain score 1-3) or moderate pain (pain score 4-6).   albuterol 108 (90 Base) MCG/ACT inhaler Commonly known as: VENTOLIN HFA Inhale 2 puffs into the lungs every 4 (four) hours as needed for wheezing or shortness of breath.   aspirin EC 81 MG tablet Take 1 tablet (81 mg total) by mouth daily. Swallow whole. Start taking on: March 25, 2023   atorvastatin 40 MG tablet Commonly known as: LIPITOR Take 1 tablet (40 mg total) by mouth daily. Start taking on: March 25, 2023   carvedilol 25 MG tablet Commonly known as: COREG Take 1 tablet (25 mg total) by mouth 2 (two) times daily with a meal.   clopidogrel 75 MG tablet Commonly known as: PLAVIX Take 1 tablet (75 mg total) by mouth daily. Start taking on: March 25, 2023   ezetimibe 10 MG tablet Commonly known as: ZETIA SMARTSIG:1.0 Tablet(s) By Mouth Daily   furosemide 40 MG tablet  Commonly known as: LASIX Take 40 mg by mouth daily.   HumaLOG KwikPen 200 UNIT/ML KwikPen Generic drug: insulin lispro Inject 25-30 Units into the skin 3 (three) times daily before meals. What changed: how much to take   Insulin Pen Needle 32G X 4 MM Misc Use 4 times a day with insulin pen.   levocetirizine 5 MG tablet Commonly known as: XYZAL TAKE 1 TABLET EVERY DAY IN THE EVENING What changed: See the new instructions.   levothyroxine 125 MCG tablet Commonly known as: SYNTHROID Take 125 mcg by mouth daily before breakfast.   losartan 100 MG tablet Commonly known as: COZAAR Take 1 tablet (100 mg total) by mouth daily.   multivitamin with minerals tablet Take 1 tablet by mouth daily.   nystatin cream Commonly known as: MYCOSTATIN APPLY 1 APPLICATION TOPICALLY TWICE DAILY What changed: See the new instructions.   onetouch ultrasoft lancets Use to test blood sugar 3 times daily as instructed. Dx:  E11.65   OVER THE COUNTER MEDICATION Take 1 tablet by mouth daily. Immunity vitamin   pantoprazole 40 MG tablet Commonly known as: Protonix Take 1 tablet (40 mg total) by mouth daily.   Evaristo Bury FlexTouch 200 UNIT/ML FlexTouch Pen Generic drug: insulin degludec Inject 50 Units into the skin daily. What changed:  how much to take when to take this   VITAMIN D-VITAMIN K PO Take 1 Dose by mouth daily. 1 dropper full-unsure of mL        Consultations: Neurology over the phone  Procedures/Studies:   ECHOCARDIOGRAM COMPLETE Result Date: 03/24/2023    ECHOCARDIOGRAM REPORT   Patient Name:   Katie Cook Date of Exam: 03/24/2023 Medical Rec #:  [id number]           Height:       60.0 in Accession #:    [id number]          Weight:       220.7 lb Date of Birth:  [birth year]           BSA:          1.946 m Patient Age:    67 years            BP:           152/71 mmHg Patient Gender: F                   HR:           74 bpm. Exam Location:  Inpatient Procedure: 2D Echo, Cardiac Doppler and Color Doppler Indications:    CHF I50.21, Abn R94.31  History:        Patient has prior history of Echocardiogram examinations, most                 recent 08/11/2020. Risk Factors:Hypertension and Diabetes.  Sonographer:    Harriette Bouillon RDCS Referring Phys: [phone] DAVID MANUEL ORTIZ IMPRESSIONS  1. Left ventricular ejection fraction, by estimation, is 60 to 65%. The left ventricle has normal function. The left ventricle has no regional wall motion abnormalities. There is mild concentric left ventricular hypertrophy. Left ventricular diastolic parameters are consistent with Grade II diastolic dysfunction (pseudonormalization).  2. Right ventricular systolic function is normal. The right ventricular size is normal. Tricuspid regurgitation signal is inadequate for assessing PA pressure.  3. Left atrial size was moderately dilated.  4. The mitral valve is normal in structure. Trivial mitral valve regurgitation.  No evidence of mitral stenosis.  5. The  aortic valve is grossly normal. Aortic valve regurgitation is not visualized. No aortic stenosis is present.  6. The inferior vena cava is normal in size with greater than 50% respiratory variability, suggesting right atrial pressure of 3 mmHg. Comparison(s): No significant change from prior study. FINDINGS  Left Ventricle: Left ventricular ejection fraction, by estimation, is 60 to 65%. The left ventricle has normal function. The left ventricle has no regional wall motion abnormalities. The left ventricular internal cavity size was normal in size. There is  mild concentric left ventricular hypertrophy. Left ventricular diastolic parameters are consistent with Grade II diastolic dysfunction (pseudonormalization). Right Ventricle: The right ventricular size is normal. Right vetricular wall thickness was not well visualized. Right ventricular systolic function is normal. Tricuspid regurgitation signal is inadequate for assessing PA pressure. Left Atrium: Left atrial size was moderately dilated. Right Atrium: Right atrial size was normal in size. Pericardium: There is no evidence of pericardial effusion. Mitral Valve: The mitral valve is normal in structure. Trivial mitral valve regurgitation. No evidence of mitral valve stenosis. Tricuspid Valve: The tricuspid valve is normal in structure. Tricuspid valve regurgitation is trivial. No evidence of tricuspid stenosis. Aortic Valve: The aortic valve is grossly normal. Aortic valve regurgitation is not visualized. No aortic stenosis is present. Pulmonic Valve: The pulmonic valve was not well visualized. Pulmonic valve regurgitation is not visualized. No evidence of pulmonic stenosis. Aorta: The aortic root, ascending aorta, aortic arch and descending aorta are all structurally normal, with no evidence of dilitation or obstruction. Venous: The inferior vena cava is normal in size with greater than 50% respiratory variability,  suggesting right atrial pressure of 3 mmHg. IAS/Shunts: The atrial septum is grossly normal.  LEFT VENTRICLE PLAX 2D LVIDd:         4.80 cm   Diastology LVIDs:         3.20 cm   LV e' medial:    7.62 cm/s LV PW:         1.10 cm   LV E/e' medial:  14.2 LV IVS:        1.10 cm   LV e' lateral:   5.55 cm/s LVOT diam:     1.80 cm   LV E/e' lateral: 19.5 LV SV:         55 LV SV Index:   28 LVOT Area:     2.54 cm  RIGHT VENTRICLE             IVC RV S prime:     10.70 cm/s  IVC diam: 1.50 cm TAPSE (M-mode): 2.7 cm LEFT ATRIUM             Index LA diam:        4.30 cm 2.21 cm/m LA Vol (A2C):   65.6 ml 33.70 ml/m LA Vol (A4C):   78.0 ml 40.07 ml/m LA Biplane Vol: 72.9 ml 37.45 ml/m  AORTIC VALVE LVOT Vmax:   90.70 cm/s LVOT Vmean:  64.200 cm/s LVOT VTI:    0.215 m  AORTA Ao Root diam: 2.90 cm Ao Asc diam:  3.20 cm MITRAL VALVE MV Area (PHT): 4.08 cm     SHUNTS MV Decel Time: 186 msec     Systemic VTI:  0.22 m MV E velocity: 108.00 cm/s  Systemic Diam: 1.80 cm MV A velocity: 125.00 cm/s MV E/A ratio:  0.86 Jodelle Red MD Electronically signed by Jodelle Red MD Signature Date/Time: 03/24/2023/2:36:30 PM    Final    MR BRAIN WO CONTRAST Result  Date: 03/23/2023 CLINICAL DATA:  Double vision EXAM: MRI HEAD WITHOUT CONTRAST TECHNIQUE: Multiplanar, multiecho pulse sequences of the brain and surrounding structures were obtained without intravenous contrast. COMPARISON:  Brain MRI 02/03/2016 FINDINGS: Brain: No acute infarction, hemorrhage, hydrocephalus, extra-axial collection or mass lesion. Small scattered FLAIR hyperintensities in the cerebral white matter with small lacune inferolateral to the left caudate head. Little progression since 2017. Brain volume remains normal. Vascular: Normal flow voids. Skull and upper cervical spine: Normal marrow signal. Sinuses/Orbits: No explanation for double vision. Bilateral cataract resection. Negative sinuses. IMPRESSION: No acute finding or explanation for  double vision. Electronically Signed   By: Tiburcio Pea M.D.   On: 03/23/2023 10:18   CT ANGIO HEAD NECK W WO CM Result Date: 03/23/2023 CLINICAL DATA:  Double vision. EXAM: CT ANGIOGRAPHY HEAD AND NECK WITH AND WITHOUT CONTRAST TECHNIQUE: Multidetector CT imaging of the head and neck was performed using the standard protocol during bolus administration of intravenous contrast. Multiplanar CT image reconstructions and MIPs were obtained to evaluate the vascular anatomy. Carotid stenosis measurements (when applicable) are obtained utilizing NASCET criteria, using the distal internal carotid diameter as the denominator. RADIATION DOSE REDUCTION: This exam was performed according to the departmental dose-optimization program which includes automated exposure control, adjustment of the mA and/or kV according to patient size and/or use of iterative reconstruction technique. CONTRAST:  75mL OMNIPAQUE IOHEXOL 350 MG/ML SOLN COMPARISON:  02/03/16 FINDINGS: CT HEAD FINDINGS Brain: No evidence of acute infarction, hemorrhage, hydrocephalus, extra-axial collection or mass lesion/mass effect. Vascular: No hyperdense vessel or unexpected calcification. Skull: Normal. Negative for fracture or focal lesion. Sinuses/Orbits: No acute finding. Review of the MIP images confirms the above findings CTA NECK FINDINGS Aortic arch: 3 vessel branching Right carotid system: Atheromatous wall thickening of the common carotid and proximal ICA, mild. No stenosis, ulceration, or beading Left carotid system: Atheromatous wall thickening of the common carotid, mild. No stenosis, ulceration, or beading. Vertebral arteries: Left dominant vertebral artery. The vertebral arteries are smoothly contoured and diffusely patent. No proximal subclavian stenosis. Skeleton: No acute finding Other neck: No acute finding Upper chest: Clear apical lungs Review of the MIP images confirms the above findings CTA HEAD FINDINGS Anterior circulation: Mild  atheromatous irregularity of medium size branches. No significant stenosis, proximal occlusion, aneurysm, or vascular malformation. Posterior circulation: Mild atheromatous irregularity of medium size branches. No significant stenosis, proximal occlusion, aneurysm, or vascular malformation. Venous sinuses: As permitted by contrast timing, patent. Anatomic variants: Fetal type left PCA Review of the MIP images confirms the above findings IMPRESSION: No emergent finding or explanation for symptoms. Mild atherosclerosis. Electronically Signed   By: Tiburcio Pea M.D.   On: 03/23/2023 08:09   DG Chest 2 View Result Date: 03/23/2023 CLINICAL DATA:  67 year old female with history of chest pain. Elevated blood pressure. Blurry vision. EXAM: CHEST - 2 VIEW COMPARISON:  Chest x-ray 02/21/2023. FINDINGS: Lung volumes are normal. No consolidative airspace disease. No pleural effusions. No pneumothorax. No pulmonary nodule or mass noted. Pulmonary vasculature and the cardiomediastinal silhouette are within normal limits. Electronic device projecting over the mid to upper left hemithorax incidentally noted. IMPRESSION: 1.  No radiographic evidence of acute cardiopulmonary disease. Electronically Signed   By: Trudie Reed M.D.   On: 03/23/2023 06:28       The results of significant diagnostics from this hospitalization (including imaging, microbiology, ancillary and laboratory) are listed below for reference.     Microbiology: No results found for this or any previous visit (  from the past 240 hours).   Labs:  CBC: Recent Labs  Lab 03/23/23 0604 03/24/23 0508  WBC 8.8 8.9  HGB 13.0 12.8  HCT 39.8 40.0  MCV 84.0 84.0  PLT 254 280   BMP &GFR Recent Labs  Lab 03/23/23 0604 03/24/23 0508  NA 135 136  K 3.6 3.7  CL 103 101  CO2 24 25  GLUCOSE 176* 213*  BUN 23 26*  CREATININE 0.52 0.64  CALCIUM 9.1 9.0  MG 2.2  --   PHOS 3.9  --    Estimated Creatinine Clearance: 72.5 mL/min (by C-G  formula based on SCr of 0.64 mg/dL). Liver & Pancreas: Recent Labs  Lab 03/23/23 0604 03/24/23 0508  AST 19 15  ALT 21 20  ALKPHOS 101 103  BILITOT 0.4 0.4  PROT 7.4 7.1  ALBUMIN 4.0 3.9   No results for input(s): "LIPASE", "AMYLASE" in the last 168 hours. No results for input(s): "AMMONIA" in the last 168 hours. Diabetic: Recent Labs    03/24/23 0508  HGBA1C 8.8*   Recent Labs  Lab 03/24/23 0541 03/24/23 0738 03/24/23 0916 03/24/23 1230 03/24/23 1515  GLUCAP 226* 208* 177* 208* 307*   Cardiac Enzymes: No results for input(s): "CKTOTAL", "CKMB", "CKMBINDEX", "TROPONINI" in the last 168 hours. No results for input(s): "PROBNP" in the last 8760 hours. Coagulation Profile: No results for input(s): "INR", "PROTIME" in the last 168 hours. Thyroid Function Tests: Recent Labs    03/24/23 1259  TSH 3.593   Lipid Profile: Recent Labs    03/24/23 0508  CHOL 203*  HDL 61  LDLCALC 122*  TRIG 99  CHOLHDL 3.3   Anemia Panel: No results for input(s): "VITAMINB12", "FOLATE", "FERRITIN", "TIBC", "IRON", "RETICCTPCT" in the last 72 hours. Urine analysis:    Component Value Date/Time   COLORURINE YELLOW 04/09/2015 1038   APPEARANCEUR CLEAR 04/09/2015 1038   LABSPEC 1.022 04/09/2015 1038   PHURINE 6.0 04/09/2015 1038   GLUCOSEU 100 (A) 04/09/2015 1038   HGBUR NEGATIVE 04/09/2015 1038   BILIRUBINUR NEGATIVE 04/09/2015 1038   KETONESUR NEGATIVE 04/09/2015 1038   PROTEINUR NEGATIVE 04/09/2015 1038   NITRITE NEGATIVE 04/09/2015 1038   LEUKOCYTESUR NEGATIVE 04/09/2015 1038   Sepsis Labs: Invalid input(s): "PROCALCITONIN", "LACTICIDVEN"   SIGNED:  Almon Hercules, MD  [facility] 03/24/2023, 6:39 PM  Hypertensive crisis/urgency

## 2023-04-23 NOTE — Progress Notes (Deleted)
Cardiology Office Note:    Date:  04/23/2023   ID:  Katie Cook, DOB 10/05/1955, MRN [id number]  PCP:  Loura Back, NP   [facility] Providers Cardiologist:  None { Click to update primary MD,subspecialty MD or APP then REFRESH:1}    Referring MD: Loura Back, NP   No chief complaint on file. ***  History of Present Illness:    Katie Cook is a 68 y.o. female is seen at the request of Loura Back NP for evaluation of chest  pain and SOB. She has a history of DM and HTN. She had a normal Myoview study in 2012. Echo in Dec showed gr 2 diastolic dysfunction. Normal EF. Valves OK.   Past Medical History:  Diagnosis Date   Diabetes mellitus without complication (HCC)    Hypertension    Thyroid disease    Uncontrolled diabetes mellitus type 2 without complications 07/26/2013    Past Surgical History:  Procedure Laterality Date   CESAREAN SECTION     THYROID SURGERY      Current Medications: No outpatient medications have been marked as taking for the 05/02/23 encounter (Appointment) with [place], Ruthann Angulo M, MD.     Allergies:   Trulicity [dulaglutide], Tramadol hcl, Metformin and related, Amlodipine, Ceclor [cefaclor], and Sulfa antibiotics   Social History   Socioeconomic History   Marital status: Legally Separated    Spouse name: Not on file   Number of children: 3   Years of education: Some college   Highest education level: Some college, no degree  Occupational History   Not on file  Tobacco Use   Smoking status: Never   Smokeless tobacco: Never  Vaping Use   Vaping status: Never Used  Substance and Sexual Activity   Alcohol use: No    Alcohol/week: 0.0 standard drinks of alcohol   Drug use: No   Sexual activity: Not Currently  Other Topics Concern   Not on file  Social History Narrative   Lives alone   Caffeine use: Decaf tea. Soda rarely        Social Drivers of Corporate investment banker Strain: Not on file  Food Insecurity:  No Food Insecurity (03/23/2023)   Hunger Vital Sign    Worried About Running Out of Food in the Last Year: Never true    Ran Out of Food in the Last Year: Never true  Transportation Needs: No Transportation Needs (03/23/2023)   PRAPARE - Administrator, Civil Service (Medical): No    Lack of Transportation (Non-Medical): No  Physical Activity: Not on file  Stress: Not on file  Social Connections: Not on file     Family History: The patient's ***family history includes Cancer in her father, maternal aunt, maternal aunt, and mother; Diabetes in her maternal grandmother and sister; Hypertension in her sister; Ovarian cancer in her mother; Stroke in her father and paternal uncle.  ROS:   Please see the history of present illness.    *** All other systems reviewed and are negative.  EKGs/Labs/Other Studies Reviewed:    The following studies were reviewed today: Echo 03/24/23: IMPRESSIONS     1. Left ventricular ejection fraction, by estimation, is 60 to 65%. The  left ventricle has normal function. The left ventricle has no regional  wall motion abnormalities. There is mild concentric left ventricular  hypertrophy. Left ventricular diastolic  parameters are consistent with Grade II diastolic dysfunction  (pseudonormalization).   2. Right ventricular systolic function  is normal. The right ventricular  size is normal. Tricuspid regurgitation signal is inadequate for assessing  PA pressure.   3. Left atrial size was moderately dilated.   4. The mitral valve is normal in structure. Trivial mitral valve  regurgitation. No evidence of mitral stenosis.   5. The aortic valve is grossly normal. Aortic valve regurgitation is not  visualized. No aortic stenosis is present.   6. The inferior vena cava is normal in size with greater than 50%  respiratory variability, suggesting right atrial pressure of 3 mmHg.   Comparison(s): No significant change from prior study.         Recent Labs: 03/23/2023: Magnesium 2.2 03/24/2023: ALT 20; BUN 26; Creatinine, Ser 0.64; Hemoglobin 12.8; Platelets 280; Potassium 3.7; Sodium 136; TSH 3.593  Recent Lipid Panel    Component Value Date/Time   CHOL 203 (H) 03/24/2023 0508   CHOL 174 04/23/2016 1703   TRIG 99 03/24/2023 0508   HDL 61 03/24/2023 0508   HDL 53 04/23/2016 1703   CHOLHDL 3.3 03/24/2023 0508   VLDL 20 03/24/2023 0508   LDLCALC 122 (H) 03/24/2023 0508   LDLCALC 100 (H) 04/23/2016 1703     Risk Assessment/Calculations:   {Does this patient have ATRIAL FIBRILLATION?:[id number]}  No BP recorded.  {Refresh Note OR Click here to enter BP  :1}***         Physical Exam:    VS:  There were no vitals taken for this visit.    Wt Readings from Last 3 Encounters:  03/23/23 220 lb 11.2 oz (100.1 kg)  02/21/23 215 lb (97.5 kg)  06/13/20 244 lb 12.8 oz (111 kg)     GEN: *** Well nourished, well developed in no acute distress HEENT: Normal NECK: No JVD; No carotid bruits LYMPHATICS: No lymphadenopathy CARDIAC: ***RRR, no murmurs, rubs, gallops RESPIRATORY:  Clear to auscultation without rales, wheezing or rhonchi  ABDOMEN: Soft, non-tender, non-distended MUSCULOSKELETAL:  No edema; No deformity  SKIN: Warm and dry NEUROLOGIC:  Alert and oriented x 3 PSYCHIATRIC:  Normal affect   ASSESSMENT:    No diagnosis found. PLAN:    In order of problems listed above:  ***      {Are you ordering a CV Procedure (e.g. stress test, cath, DCCV, TEE, etc)?   Press F2        :[id number]}    Medication Adjustments/Labs and Tests Ordered: Current medicines are reviewed at length with the patient today.  Concerns regarding medicines are outlined above.  No orders of the defined types were placed in this encounter.  No orders of the defined types were placed in this encounter.   There are no Patient Instructions on file for this visit.   Signed, Reyes Aldaco [place], MD  04/23/2023 4:16 PM    [facility]

## 2023-05-02 ENCOUNTER — Ambulatory Visit: Payer: Medicare (Managed Care) | Admitting: Cardiology

## 2023-06-22 ENCOUNTER — Ambulatory Visit: Payer: Medicare (Managed Care) | Admitting: Cardiology

## 2023-06-22 NOTE — Progress Notes (Unsigned)
 Cardiology Office Note   Date:  06/23/2023   ID:  Katie Cook, DOB October 28, 1955, MRN [id number]  PCP:  Loura Back, NP  Cardiologist:   Rollene Rotunda, MD Referring:  Loura Back, NP  Chief Complaint  Patient presents with   Blurred Vision      History of Present Illness: Katie Cook is a 68 y.o. female who presents for evaluation of uncontrolled HTN.  She was admitted in Dec for this.  I reviewed these records for this visit.   She had blurred vision and presented to the ED with BP of 280/98.  TTE was normal except for diastolic dysfunction.  Head CT and brain MRI were normal.  A1C was 8.8%.    She said that that night she had blurred vision and her daughter drove her to the ER.  She said she has not had that since then.  She has been on the meds listed below and her blood pressure systolics are running in the 150s and occasionally in the 120s.  She does have daytime somnolence.  (STOP-BANG 4) and has had difficult to control hypertension for some time.  She did have renal ultrasound I saw in 2021 and there was no obstructive disease.  She does not have any other cardiovascular symptoms.  Many many years ago she had a stress test that was normal.  She wore a monitor recently because of some palpitations and I do not have these results.  She does not have any presyncope or syncope.  She denies any chest pressure, neck or arm discomfort.  She has had some weight gain although she is weight down from her peak weight.  She had been losing.  She has had a lot of stress.  She is lost some family members including her daughter who died 6 months ago.  She is going to be spending some time with one of her daughters in the [place] and some time in [place] but she lives here and is moving in with another daughter.   Past Medical History:  Diagnosis Date   Diabetes mellitus without complication (HCC)    Hypertension    Thyroid disease    Uncontrolled diabetes mellitus type 2  without complications 07/26/2013    Past Surgical History:  Procedure Laterality Date   CESAREAN SECTION     THYROID SURGERY       Current Outpatient Medications  Medication Sig Dispense Refill   acetaminophen (TYLENOL) 500 MG tablet Take 1,000 mg by mouth 3 (three) times daily as needed for mild pain (pain score 1-3) or moderate pain (pain score 4-6).     albuterol (VENTOLIN HFA) 108 (90 Base) MCG/ACT inhaler Inhale 2 puffs into the lungs every 4 (four) hours as needed for wheezing or shortness of breath.     aspirin EC 81 MG tablet Take 1 tablet (81 mg total) by mouth daily. Swallow whole.     atorvastatin (LIPITOR) 40 MG tablet Take 1 tablet (40 mg total) by mouth daily. 90 tablet 0   carvedilol (COREG) 25 MG tablet Take 1 tablet (25 mg total) by mouth 2 (two) times daily with a meal. 180 tablet 3   furosemide (LASIX) 40 MG tablet Take 40 mg by mouth daily.     glucose blood (ACCU-CHEK GUIDE) test strip Use as instructed to check blood sugar 3-4 times a day 100 each 12   insulin degludec (TRESIBA FLEXTOUCH) 200 UNIT/ML FlexTouch Pen Inject 50 Units into the skin daily. (Patient  taking differently: Inject 25 Units into the skin every evening.) 3 mL 0   Insulin Lispro (HUMALOG KWIKPEN) 200 UNIT/ML SOPN Inject 25-30 Units into the skin 3 (three) times daily before meals. (Patient taking differently: Inject 18-25 Units into the skin 3 (three) times daily before meals.) 30 pen 3   Insulin Pen Needle 32G X 4 MM MISC Use 4 times a day with insulin pen. 400 each 1   Lancets (ONETOUCH ULTRASOFT) lancets Use to test blood sugar 3 times daily as instructed. Dx: E11.65 300 each 3   levocetirizine (XYZAL) 5 MG tablet TAKE 1 TABLET EVERY DAY IN THE EVENING (Patient taking differently: Take 5 mg by mouth daily as needed for allergies.) 90 tablet 3   levothyroxine (SYNTHROID) 125 MCG tablet Take 125 mcg by mouth daily before breakfast.     losartan (COZAAR) 100 MG tablet Take 1 tablet (100 mg total) by  mouth daily. 90 tablet 5   Multiple Vitamins-Minerals (MULTIVITAMIN WITH MINERALS) tablet Take 1 tablet by mouth daily.     nystatin cream (MYCOSTATIN) APPLY 1 APPLICATION TOPICALLY TWICE DAILY (Patient taking differently: Apply 1 Application topically as needed (rash).) 60 g 1   OVER THE COUNTER MEDICATION Take 1 tablet by mouth daily. Immunity vitamin     spironolactone (ALDACTONE) 25 MG tablet Take 1 tablet (25 mg total) by mouth daily. 90 tablet 3   VITAMIN D-VITAMIN K PO Take 1 Dose by mouth daily. 1 dropper full-unsure of mL     clopidogrel (PLAVIX) 75 MG tablet Take 1 tablet (75 mg total) by mouth daily. (Patient not taking: Reported on 06/23/2023) 21 tablet 0   ezetimibe (ZETIA) 10 MG tablet SMARTSIG:1.0 Tablet(s) By Mouth Daily (Patient not taking: Reported on 06/23/2023)     pantoprazole (PROTONIX) 40 MG tablet Take 1 tablet (40 mg total) by mouth daily. 30 tablet 1   No current facility-administered medications for this visit.    Allergies:   Trulicity [dulaglutide], Tramadol hcl, Metformin and related, Amlodipine, Ceclor [cefaclor], and Sulfa antibiotics    Social History:  The patient  reports that she has never smoked. She has never used smokeless tobacco. She reports that she does not drink alcohol and does not use drugs.   Family History:  The patient's family history includes Cancer in her father, maternal aunt, maternal aunt, and mother; Diabetes in her maternal grandmother and sister; Hypertension in her sister; Ovarian cancer in her mother; Stroke in her father and paternal uncle.    ROS:  Please see the history of present illness.   Otherwise, review of systems are positive for none.   All other systems are reviewed and negative.    PHYSICAL EXAM: VS:  BP (!) 170/78 (BP Location: Left Arm, Patient Position: Sitting, Cuff Size: Large)   Pulse 70   Ht 5\' 1"  (1.549 m)   Wt 227 lb (103 kg)   SpO2 96%   BMI 42.89 kg/m  , BMI Body mass index is 42.89 kg/m. GENERAL:  Well  appearing HEENT:  Pupils equal round and reactive, fundi not visualized, oral mucosa unremarkable NECK:  No jugular venous distention, waveform within normal limits, carotid upstroke brisk and symmetric, no bruits, no thyromegaly LYMPHATICS:  No cervical, inguinal adenopathy LUNGS:  Clear to auscultation bilaterally BACK:  No CVA tenderness CHEST:  Unremarkable HEART:  PMI not displaced or sustained,S1 and S2 within normal limits, no S3, no S4, no clicks, no rubs, no murmurs ABD:  Flat, positive bowel sounds normal in frequency  in pitch, no bruits, no rebound, no guarding, no midline pulsatile mass, no hepatomegaly, no splenomegaly EXT:  2 plus pulses throughout, no edema, no cyanosis no clubbing SKIN:  No rashes no nodules NEURO:  Cranial nerves II through XII grossly intact, motor grossly intact throughout [facility]:  Cognitively intact, oriented to person place and time    EKG:  EKG Interpretation Date/Time:  Thursday June 23 2023 14:57:55 EDT Ventricular Rate:  70 PR Interval:  162 QRS Duration:  90 QT Interval:  406 QTC Calculation: 438 R Axis:   18  Text Interpretation: Normal sinus rhythm Minimal voltage criteria for LVH, may be normal variant ( Sokolow-Lyon ) Nonspecific T wave abnormality When compared with ECG of 23-Mar-2023 06:03, No significant change since last tracing Confirmed by Rollene Rotunda ([id number]) on 06/23/2023 3:23:47 PM     Recent Labs: 03/23/2023: Magnesium 2.2 03/24/2023: ALT 20; BUN 26; Creatinine, Ser 0.64; Hemoglobin 12.8; Platelets 280; Potassium 3.7; Sodium 136; TSH 3.593    Lipid Panel    Component Value Date/Time   CHOL 203 (H) 03/24/2023 0508   CHOL 174 04/23/2016 1703   TRIG 99 03/24/2023 0508   HDL 61 03/24/2023 0508   HDL 53 04/23/2016 1703   CHOLHDL 3.3 03/24/2023 0508   VLDL 20 03/24/2023 0508   LDLCALC 122 (H) 03/24/2023 0508   LDLCALC 100 (H) 04/23/2016 1703      Wt Readings from Last 3 Encounters:  06/23/23 227 lb (103 kg)   03/23/23 220 lb 11.2 oz (100.1 kg)  02/21/23 215 lb (97.5 kg)      Other studies Reviewed: Additional studies/ records that were reviewed today include: Hospital records. Review of the above records demonstrates:  Please see elsewhere in the note.     ASSESSMENT AND PLAN:  HTN: I think she has essential hypertension.  I am going to have spironolactone 25 mg daily and she will keep a blood pressure diary.  We will have her come back in about a month to our Hypertension Pharm.D[facility] Clinic  Diastolic dysfunction: She seems to be euvolemic.  We will manage this in the context of treating her blood pressure.  I did tell her she can take her Lasix as needed.     Arrhythmia: She has some palpitations.  I will check the results of the monitor that she wore.  Questionable sleep apnea: STOP-BANG is 4.  She has difficult to control hypertension.  I am going to order a sleep study.  This will be a home sleep study.  Current medicines are reviewed at length with the patient today.  The patient does not have concerns regarding medicines.  The following changes have been made:  no change  Labs/ tests ordered today include:   Orders Placed This Encounter  Procedures   AMB Referral to [facility]   EKG 12-Lead   Home sleep test     Disposition:   FU with with HTN Clinic as above.     Signed, Rollene Rotunda, MD  06/23/2023 3:46 PM    [facility]

## 2023-06-23 ENCOUNTER — Encounter: Payer: Self-pay | Admitting: Cardiology

## 2023-06-23 ENCOUNTER — Ambulatory Visit: Payer: Medicare (Managed Care) | Attending: Cardiology | Admitting: Cardiology

## 2023-06-23 VITALS — BP 170/78 | HR 70 | Ht 61.0 in | Wt 227.0 lb

## 2023-06-23 DIAGNOSIS — I10 Essential (primary) hypertension: Secondary | ICD-10-CM

## 2023-06-23 DIAGNOSIS — R400 Somnolence: Secondary | ICD-10-CM | POA: Diagnosis not present

## 2023-06-23 MED ORDER — SPIRONOLACTONE 25 MG PO TABS: 25.0000 mg | ORAL_TABLET | Freq: Every day | ORAL | 3 refills | Status: AC

## 2023-06-23 NOTE — Patient Instructions (Signed)
 Medication Instructions:  Start spirinolactone 25 mg daily by mouth. Script sent. *If you need a refill on your cardiac medications before your next appointment, please call your pharmacy*  Testing/Procedures: Your physician has recommended that you have a sleep study. This test records several body functions during sleep, including: brain activity, eye movement, oxygen and carbon dioxide blood levels, heart rate and rhythm, breathing rate and rhythm, the flow of air through your mouth and nose, snoring, body muscle movements, and chest and belly movement.    Follow-Up: At [facility], you and your health needs are our priority.  As part of our continuing mission to provide you with exceptional heart care, we have created designated Provider Care Teams.  These Care Teams include your primary Cardiologist (physician) and Advanced Practice Providers (APPs -  Physician Assistants and Nurse Practitioners) who all work together to provide you with the care you need, when you need it.  We recommend signing up for the patient portal called "MyChart".  Sign up information is provided on this After Visit Summary.  MyChart is used to connect with patients for Virtual Visits (Telemedicine).  Patients are able to view lab/test results, encounter notes, upcoming appointments, etc.  Non-urgent messages can be sent to your provider as well.   To learn more about what you can do with MyChart, go to [email or web address].    Your next appointment:    As needed.   Provider:   Rollene Rotunda, MD     Other Instructions . Take and record blood pressure readings daily. Submit results on MyChart message.

## 2023-06-24 ENCOUNTER — Telehealth: Payer: Self-pay

## 2023-06-24 NOTE — Telephone Encounter (Signed)
**Note De-Identified Lesean Woolverton Obfuscation** I called [facility] and was advised by [facility] R that I would need to completed a prior authorization form and she guided me to the site where I could print the form off.  Per Mehe, I have completed the PA Form, included office visit notes from 06/23/23, the pts Stop Bang score and I faxed all to [facility] at [phone]. I did receive confirmation that the fax sent successfully.

## 2023-06-30 ENCOUNTER — Telehealth: Payer: Self-pay | Admitting: Cardiology

## 2023-06-30 NOTE — Telephone Encounter (Signed)
 Spoke with patient regarding blood pressures Reviewed medications, confirmed taking below Aldactone 25 mg daily -  started after visit 3/13 Losartan 100 mg daily  Carvedilol 25 mg twice  Only takes Lasix as needed    Saw PCP today blood pressure was 171/79 and 177/82 Has been checking at home 165-170's/70's-80 PCP recommended increasing Clonidine  Advised patient didn't see Clonidine on her list Called pharmacy to follow up on dose, Clonidine 0.1 mg twice a day last filled 03/2023  Returned call to patient who was home now, reviewed medications and has not been taking Clonidine   Advised will forward to Dr Antoine Poche for review

## 2023-06-30 NOTE — Telephone Encounter (Signed)
 Pt c/o medication issue:  1. Name of Medication: spironolactone (ALDACTONE) 25 MG tablet  2. How are you currently taking this medication (dosage and times per day)? Yes as written  3. Are you having a reaction (difficulty breathing--STAT)? No  4. What is your medication issue? Pt would like a c/b in regards to this medication she feels as if its making her BP elevate (BP Today: 177/71). Please advise

## 2023-07-01 MED ORDER — CLONIDINE HCL 0.1 MG PO TABS: 0.1000 mg | ORAL_TABLET | Freq: Two times a day (BID) | ORAL | 3 refills | Status: AC

## 2023-07-01 NOTE — Telephone Encounter (Signed)
  I would start by taking the clonidine 0.1 mg bid   Returned call to pt(2 id's verified) she states that she has not taken her clonidine 0.1 mg bid. For at least 3 months she states that she "ran out" and has not refilled. Explained to pt that is is probably the reason her BP/HR is running high. Verified pharmacy, will send refill. She will pick up and start.  Pt is asking about her home sleep test as she has not received this yet. Informed pt that PA form has been completed and successful transmit. She states that she would like to call and discuss PA with them as she has not received. Phone number given. I will forward to sleep dept for review and further instruction

## 2023-07-04 NOTE — Telephone Encounter (Signed)
**Note De-Identified Sharisa Toves Obfuscation** See phone note from 3/14 for updates.

## 2023-07-04 NOTE — Telephone Encounter (Signed)
**Note De-Identified Wylan Gentzler Obfuscation** I called [facility] to check the status of this Home Sleep Study Type 3 PA and s/w Vikram. Per Satira Sark, there are 4 more days left for them to make a determination as it takes 14 days. Satira Sark also advised me that it looks like Dr Antoine Poche nor the [facility] is in the pts plans network and advised me to discuss with the pt.  Satira Sark did add notes to the PA I started on 3/14 and he stated that he maybe incorrect but according to the pts plan he feels the PA will be denied because we are out of network.  I called the pt and explained what I was advised by Satira Sark.  The pt stated that it was her understanding when she chose her Medicare insurance plan that we would be in network and that she is going to call them to get a better understanding of her plan and will call me back tomorrow and let me know what they tell her.  She  is aware that per Satira Sark, the PA that I started on 3/14 is still pending but that it is unlikely that they will cover.  She thanked me for my call.

## 2023-07-15 ENCOUNTER — Telehealth: Payer: Self-pay | Admitting: Cardiology

## 2023-07-15 ENCOUNTER — Ambulatory Visit: Payer: Medicare (Managed Care) | Admitting: Pharmacist Clinician (PhC)/ Clinical Pharmacy Specialist

## 2023-07-15 NOTE — Telephone Encounter (Signed)
 Patient called to follow-up on getting scheduled for sleep study.  Patient stated her insurance will cover this test.

## 2023-07-15 NOTE — Telephone Encounter (Signed)
**Note De-Identified Su Duma Obfuscation** See phone note from 3/14 for update.

## 2023-07-15 NOTE — Telephone Encounter (Addendum)
**Note De-Identified Seneca Gadbois Obfuscation** I called [facility] and was advised by Madhu that this Home Sleep Test Type 3 has been approved from 07/07/23-01/03/24. Authorization #: [id number]  I did call the pt and made her aware of this approval. She stated that someone at [facility] advised her that Dr Antoine Poche is out of network and that she should find an MD that is in network with her plan.  I advised her that I will call [facility] back to ask if Dr Antoine Poche is or is not in network and that I would call her back after I s/w them again.  I called [facility] and was advised by Annisa that Dr Antoine Poche is in Network and that this PA is approved to be done at [facility].  I called the pt back but got no answer so I left a message on her VM as requested by the pt in our first conversation this morning. I advised in the message that Dr Antoine Poche and [facility] are both in network with her plan and that if she has any questions to call us back or to contact [facility] for further explanation.  I have transferred the Home Sleep Study Type 3 order to the Sleep lab so they can contact the pt to schedule the test.

## 2023-07-22 ENCOUNTER — Ambulatory Visit: Payer: Medicare (Managed Care) | Admitting: Cardiology

## 2023-09-12 ENCOUNTER — Ambulatory Visit: Payer: Medicare (Managed Care) | Admitting: Pharmacist Clinician (PhC)/ Clinical Pharmacy Specialist
